# Patient Record
Sex: Female | Born: 1940 | Race: White | Hispanic: No | Smoking: Former smoker
Health system: Southern US, Community
[De-identification: ages and names within clinical notes are randomized; demographics above are authoritative.]

## PROBLEM LIST (undated history)

## (undated) DIAGNOSIS — M1990 Unspecified osteoarthritis, unspecified site: Secondary | ICD-10-CM

## (undated) DIAGNOSIS — H9190 Unspecified hearing loss, unspecified ear: Secondary | ICD-10-CM

## (undated) DIAGNOSIS — I2510 Atherosclerotic heart disease of native coronary artery without angina pectoris: Secondary | ICD-10-CM

## (undated) DIAGNOSIS — J40 Bronchitis, not specified as acute or chronic: Secondary | ICD-10-CM

## (undated) DIAGNOSIS — F329 Major depressive disorder, single episode, unspecified: Secondary | ICD-10-CM

## (undated) DIAGNOSIS — F32A Depression, unspecified: Secondary | ICD-10-CM

## (undated) DIAGNOSIS — G4730 Sleep apnea, unspecified: Secondary | ICD-10-CM

## (undated) DIAGNOSIS — I10 Essential (primary) hypertension: Secondary | ICD-10-CM

## (undated) HISTORY — PX: SHOULDER ARTHROSCOPY: SHX128

## (undated) HISTORY — PX: PACEMAKER INSERTION: SHX728

## (undated) HISTORY — PX: NOSE SURGERY: SHX723

## (undated) HISTORY — PX: JOINT REPLACEMENT: SHX530

## (undated) HISTORY — DX: Bronchitis, not specified as acute or chronic: J40

## (undated) HISTORY — PX: BACK SURGERY: SHX140

## (undated) HISTORY — DX: Depression, unspecified: F32.A

## (undated) HISTORY — PX: ABDOMINAL HYSTERECTOMY: SHX81

## (undated) HISTORY — PX: FOOT SURGERY: SHX648

## (undated) HISTORY — DX: Major depressive disorder, single episode, unspecified: F32.9

## (undated) HISTORY — DX: Essential (primary) hypertension: I10

## (undated) HISTORY — PX: APPENDECTOMY: SHX54

## (undated) HISTORY — PX: OTHER SURGICAL HISTORY: SHX169

## (undated) HISTORY — PX: SPINE SURGERY: SHX786

---

## 2005-02-20 ENCOUNTER — Ambulatory Visit: Payer: Self-pay | Admitting: General Practice

## 2006-01-23 ENCOUNTER — Ambulatory Visit: Payer: Self-pay | Admitting: Internal Medicine

## 2006-09-12 ENCOUNTER — Ambulatory Visit: Payer: Self-pay

## 2006-11-27 ENCOUNTER — Ambulatory Visit: Payer: Self-pay | Admitting: Orthopaedic Surgery

## 2006-12-04 ENCOUNTER — Ambulatory Visit: Payer: Self-pay | Admitting: Orthopaedic Surgery

## 2007-03-13 ENCOUNTER — Ambulatory Visit: Payer: Self-pay | Admitting: Vascular Surgery

## 2007-04-01 ENCOUNTER — Ambulatory Visit: Payer: Self-pay | Admitting: Vascular Surgery

## 2007-04-25 ENCOUNTER — Ambulatory Visit: Payer: Self-pay | Admitting: Internal Medicine

## 2007-05-15 ENCOUNTER — Ambulatory Visit: Payer: Self-pay | Admitting: Gastroenterology

## 2007-09-09 ENCOUNTER — Ambulatory Visit: Payer: Self-pay | Admitting: Internal Medicine

## 2008-09-09 ENCOUNTER — Ambulatory Visit: Payer: Self-pay | Admitting: Internal Medicine

## 2009-09-20 ENCOUNTER — Ambulatory Visit: Payer: Self-pay | Admitting: Internal Medicine

## 2010-02-13 ENCOUNTER — Ambulatory Visit: Payer: Self-pay | Admitting: General Practice

## 2010-03-01 ENCOUNTER — Inpatient Hospital Stay: Payer: Self-pay | Admitting: General Practice

## 2010-06-28 ENCOUNTER — Ambulatory Visit: Payer: Self-pay | Admitting: Internal Medicine

## 2010-08-23 ENCOUNTER — Ambulatory Visit: Payer: Self-pay | Admitting: Ophthalmology

## 2010-09-04 ENCOUNTER — Ambulatory Visit: Payer: Self-pay | Admitting: Ophthalmology

## 2010-09-05 ENCOUNTER — Ambulatory Visit: Payer: Self-pay | Admitting: Cardiovascular Disease

## 2011-01-17 ENCOUNTER — Ambulatory Visit: Payer: Self-pay | Admitting: Internal Medicine

## 2012-09-11 ENCOUNTER — Ambulatory Visit: Payer: Self-pay | Admitting: General Practice

## 2012-09-11 LAB — HEMOGLOBIN: HGB: 12.9 g/dL (ref 12.0–16.0)

## 2012-09-17 ENCOUNTER — Ambulatory Visit: Payer: Self-pay | Admitting: General Practice

## 2013-07-24 ENCOUNTER — Ambulatory Visit: Payer: Self-pay | Admitting: Family Medicine

## 2015-01-25 NOTE — Op Note (Signed)
PATIENT NAME:  Amber Bridges, Amber Bridges MR#:  [id number] DATE OF BIRTH:  1941-02-28  DATE OF PROCEDURE:  09/17/2012  PREOPERATIVE DIAGNOSIS: Right carpal tunnel syndrome.   POSTOPERATIVE DIAGNOSIS: Right carpal tunnel syndrome.    PROCEDURE PERFORMED: Right carpal tunnel release.   SURGEON: Laurice Record. Holley Bouche., MD   ANESTHESIA: General.   ESTIMATED BLOOD LOSS: Minimal.   TOURNIQUET TIME: 26 minutes.   DRAINS: None.   INDICATIONS FOR SURGERY: The patient is a 74 year old female who has been seen for complaints of numbness and paresthesias to the right hand with some radiation of paresthesias up the right arm. EMG nerve conduction studies were consistent with moderately severe right carpal tunnel syndrome. The patient denies any significant improvement despite splinting and activity modification. After discussion of the risks and benefits of surgical intervention, the patient expressed her understanding of the risks and benefits and agreed with plans for surgical intervention.   PROCEDURE IN DETAIL: The patient was brought into the operating room and, after adequate general anesthesia was achieved, a tourniquet was placed on the patient's upper right arm. The patient's right hand and arm were cleaned and prepped with alcohol and DuraPrep and draped in the usual sterile fashion. A "time-out" was performed as per usual protocol. The right upper extremity was exsanguinated using an Esmarch and the tourniquet was inflated to 250 mmHg. Loupe magnification was used throughout the procedure. A curvilinear incision was made just ulnar to the thenar palmar crease. Dissection was carried down through the palmar fascia to the transverse carpal ligament. Transverse carpal ligament was sharply incised, taking care to protect the underlying structures within the carpal tunnel. Complete release of the transverse carpal ligament was achieved. Inspection of the median nerve demonstrated a fusiform appearance initially  consistent with compression under the extent of the transverse carpal ligament. No evidence of lipoma or ganglion cyst. Wound was irrigated with copious amounts of normal saline with antibiotic solution. Skin edges were reapproximated with interrupted sutures of #5-0 nylon. 10 mL of 0.25% Marcaine was injected along the incision site. Sterile dressing was applied followed by application of a volar splint. Tourniquet was deflated after total tourniquet time of 26 minutes.   The patient tolerated the procedure well. She was transported to the recovery room in stable condition.   ____________________________ Laurice Record. Holley Bouche., MD jph:drc D: 09/17/2012 20:59:02 ET T: 09/18/2012 07:43:39 ET JOB#: [id number]  cc: Laurice Record. Holley Bouche., MD, <Dictator> Laurice Record Holley Bouche MD ELECTRONICALLY SIGNED 09/19/2012 19:47

## 2015-04-27 ENCOUNTER — Emergency Department: Payer: Medicare Other

## 2015-04-27 ENCOUNTER — Emergency Department
Admission: EM | Admit: 2015-04-27 | Discharge: 2015-04-27 | Disposition: A | Discharge status: Other Acute Inpatient Hospital | Payer: Medicare Other | Attending: Emergency Medicine | Admitting: Emergency Medicine

## 2015-04-27 DIAGNOSIS — X58XXXS Exposure to other specified factors, sequela: Secondary | ICD-10-CM | POA: Insufficient documentation

## 2015-04-27 DIAGNOSIS — S3992XS Unspecified injury of lower back, sequela: Secondary | ICD-10-CM | POA: Diagnosis not present

## 2015-04-27 DIAGNOSIS — M25551 Pain in right hip: Secondary | ICD-10-CM | POA: Diagnosis not present

## 2015-04-27 DIAGNOSIS — Z880 Allergy status to penicillin: Secondary | ICD-10-CM | POA: Diagnosis not present

## 2015-04-27 DIAGNOSIS — Z7982 Long term (current) use of aspirin: Secondary | ICD-10-CM | POA: Insufficient documentation

## 2015-04-27 DIAGNOSIS — G4730 Sleep apnea, unspecified: Secondary | ICD-10-CM | POA: Insufficient documentation

## 2015-04-27 DIAGNOSIS — R339 Retention of urine, unspecified: Secondary | ICD-10-CM | POA: Insufficient documentation

## 2015-04-27 DIAGNOSIS — Z79899 Other long term (current) drug therapy: Secondary | ICD-10-CM | POA: Insufficient documentation

## 2015-04-27 DIAGNOSIS — G8220 Paraplegia, unspecified: Secondary | ICD-10-CM | POA: Insufficient documentation

## 2015-04-27 DIAGNOSIS — M5116 Intervertebral disc disorders with radiculopathy, lumbar region: Secondary | ICD-10-CM | POA: Insufficient documentation

## 2015-04-27 DIAGNOSIS — G834 Cauda equina syndrome: Secondary | ICD-10-CM | POA: Insufficient documentation

## 2015-04-27 DIAGNOSIS — E669 Obesity, unspecified: Secondary | ICD-10-CM | POA: Insufficient documentation

## 2015-04-27 DIAGNOSIS — M549 Dorsalgia, unspecified: Secondary | ICD-10-CM | POA: Diagnosis present

## 2015-04-27 DIAGNOSIS — Z8659 Personal history of other mental and behavioral disorders: Secondary | ICD-10-CM | POA: Insufficient documentation

## 2015-04-27 LAB — URINALYSIS COMPLETE WITH MICROSCOPIC (ARMC ONLY)
BILIRUBIN URINE: NEGATIVE
Bacteria, UA: NONE SEEN
GLUCOSE, UA: NEGATIVE mg/dL
Hgb urine dipstick: NEGATIVE
KETONES UR: NEGATIVE mg/dL
NITRITE: NEGATIVE
Protein, ur: NEGATIVE mg/dL
Specific Gravity, Urine: 1.018 (ref 1.005–1.030)
pH: 7 (ref 5.0–8.0)

## 2015-04-27 LAB — BASIC METABOLIC PANEL
Anion gap: 10 (ref 5–15)
BUN: 27 mg/dL — ABNORMAL HIGH (ref 6–20)
CALCIUM: 9.5 mg/dL (ref 8.9–10.3)
CO2: 26 mmol/L (ref 22–32)
Chloride: 103 mmol/L (ref 101–111)
Creatinine, Ser: 1.17 mg/dL — ABNORMAL HIGH (ref 0.44–1.00)
GFR calc non Af Amer: 45 mL/min — ABNORMAL LOW (ref >60)
GFR, EST AFRICAN AMERICAN: 52 mL/min — AB (ref >60)
Glucose, Bld: 110 mg/dL — ABNORMAL HIGH (ref 65–99)
Potassium: 3.8 mmol/L (ref 3.5–5.1)
Sodium: 139 mmol/L (ref 135–145)

## 2015-04-27 LAB — CBC WITH DIFFERENTIAL/PLATELET
BASOS PCT: 1 %
Basophils Absolute: 0 10*3/uL (ref 0–0.1)
EOS ABS: 0.2 10*3/uL (ref 0–0.7)
Eosinophils Relative: 4 %
HCT: 39.9 % (ref 35.0–47.0)
HEMOGLOBIN: 13.2 g/dL (ref 12.0–16.0)
LYMPHS ABS: 1.4 10*3/uL (ref 1.0–3.6)
LYMPHS PCT: 23 %
MCH: 30.2 pg (ref 26.0–34.0)
MCHC: 33.2 g/dL (ref 32.0–36.0)
MCV: 90.9 fL (ref 80.0–100.0)
MONOS PCT: 5 %
Monocytes Absolute: 0.3 10*3/uL (ref 0.2–0.9)
NEUTROS ABS: 4.3 10*3/uL (ref 1.4–6.5)
Neutrophils Relative %: 67 %
Platelets: 168 10*3/uL (ref 150–440)
RBC: 4.39 MIL/uL (ref 3.80–5.20)
RDW: 13.6 % (ref 11.5–14.5)
WBC: 6.3 10*3/uL (ref 3.6–11.0)

## 2015-04-27 MED ORDER — HYDROMORPHONE HCL 1 MG/ML IJ SOLN
INTRAMUSCULAR | Status: AC
  Administered 2015-04-27: 1 mg via INTRAVENOUS
  Filled 2015-04-27: qty 1

## 2015-04-27 MED ORDER — HYDROMORPHONE HCL 1 MG/ML IJ SOLN
1.0000 mg | Freq: Once | INTRAMUSCULAR | Status: AC
  Administered 2015-04-27: 1 mg via INTRAVENOUS
  Filled 2015-04-27: qty 1

## 2015-04-27 MED ORDER — SODIUM CHLORIDE 0.9 % IV BOLUS (SEPSIS)
1000.0000 mL | Freq: Once | INTRAVENOUS | Status: AC
  Administered 2015-04-27: 1000 mL via INTRAVENOUS

## 2015-04-27 MED ORDER — HYDROMORPHONE HCL 1 MG/ML IJ SOLN
1.0000 mg | Freq: Once | INTRAMUSCULAR | Status: AC
  Administered 2015-04-27: 1 mg via INTRAVENOUS

## 2015-04-27 MED ORDER — METHYLPREDNISOLONE SODIUM SUCC 125 MG IJ SOLR
125.0000 mg | Freq: Once | INTRAMUSCULAR | Status: AC
  Administered 2015-04-27: 125 mg via INTRAVENOUS
  Filled 2015-04-27: qty 2

## 2015-04-27 NOTE — ED Notes (Signed)
Blood drawn at 0800, lab states they can not find blood and it was never received, will redraw blood when pt returns from MR

## 2015-04-27 NOTE — ED Notes (Signed)
Pt given ginger ale, pt resting in bed quietly awaiting [facility] pickup

## 2015-04-27 NOTE — ED Provider Notes (Addendum)
[facility] Emergency Department Provider Note  ____________________________________________  Time seen: 7:05 AM  I have reviewed the triage vital signs and the nursing notes.   HISTORY  Chief Complaint Back Pain and Leg Pain    HPI Amber Bridges is a 74 y.o. female who complains of low back pain and weakness in the left leg.  She was in her usual state of health up until about 2 weeks ago when she had a trip and fall at home onto her right hip. She came to [facility] clinic walk-in at that time and was given a prednisone taper without any further workup. She had persistent pain at that time that did not improve. She then traveled to [place] by plane, and noted that she began to have worsening left leg pain. She went to an emergency room there where she received an x-ray of the right leg, which was noted to be unremarkable, and the patient was discharged. She notes that at that time she was having difficulty walking. Since returning home, she's been having weakness in the left leg. She walks by pushing a chair around the house and leaning on the back of it. She notes that she has to swing her left leg and that it "doesn't want to do what I wanted to". 3 days ago, as she was pushing the chair into the bathroom, a got stuck on the floor causing her to fall into the bathtub. The low back pain has been significantly worsened since then, accompanied by worsened weakness and diminished sensation in the left leg.  She does have a history of 2 prior lumbar surgeries approximately 20 years ago and 30 years ago.   She denies any fevers or chills nausea vomiting diarrhea abdominal pain chest pain shortness of breath headache or syncope.       No past medical history on file.  hypertension, grief reaction  There are no active problems to display for this patient.   No past surgical history on file.  lumbar spine surgery in 1973 and 1993, left knee replacement, left  rotator cuff surgery  Current Outpatient Rx  Name  Route  Sig  Dispense  Refill  . amLODipine (NORVASC) 5 MG tablet   Oral   Take 1 tablet by mouth daily.         Marland Kitchen aspirin EC 81 MG tablet   Oral   Take 1 tablet by mouth daily.         Marland Kitchen atorvastatin (LIPITOR) 40 MG tablet   Oral   Take 1 tablet by mouth at bedtime.         Marland Kitchen FLUoxetine (PROZAC) 40 MG capsule   Oral   Take 1 capsule by mouth daily.         . hydrochlorothiazide (HYDRODIURIL) 25 MG tablet   Oral   Take 1 tablet by mouth daily.         Marland Kitchen HYDROcodone-acetaminophen (NORCO) 7.5-325 MG per tablet   Oral   Take 1 tablet by mouth every 4 (four) hours as needed for moderate pain.          Marland Kitchen losartan (COZAAR) 50 MG tablet   Oral   Take 1 tablet by mouth daily.         . Multiple Vitamins-Minerals (STRESS TAB NF PO)   Oral   Take 1 tablet by mouth daily.         Marland Kitchen oxaprozin (DAYPRO) 600 MG tablet   Oral   Take  1 tablet by mouth daily.         . traZODone (DESYREL) 50 MG tablet   Oral   Take 1 tablet by mouth at bedtime as needed for sleep.            Allergies Penicillins; Egg white; and Fluzone  No family history on file.  Social History History  Substance Use Topics  . Smoking status: Not on file  . Smokeless tobacco: Not on file  . Alcohol Use: Not on file  No tobacco alcohol or drug use   Review of Systems  Constitutional: No fever or chills. No weight changes Eyes:No blurry vision or double vision.  ENT: No sore throat. Cardiovascular: No chest pain. Respiratory: No dyspnea or cough. Gastrointestinal: Negative for abdominal pain, vomiting and diarrhea.  No BRBPR or melena. Genitourinary: Negative for dysuria, urinary retention, bloody urine, or difficulty urinating. MusculoskeletalPositive back pain as above with right leg pain, and left leg pain and swelling and weakness and diminished sensation. She also has left upper arm pain. Skin: Negative for  rash. Neurological: Negative for headaches, focal weakness or numbness. Psychiatric:No anxiety or depression.   Endocrine:No hot/cold intolerance, changes in energy, or sleep difficulty.  10-point ROS otherwise negative.  ____________________________________________   PHYSICAL EXAM:  VITAL SIGNS: ED Triage Vitals  Enc Vitals Group     BP 04/27/15 0633 163/66 mmHg     Pulse Rate 04/27/15 0633 58     Resp 04/27/15 0633 20     Temp 04/27/15 0633 97.9 F (36.6 C)     Temp Source 04/27/15 0633 Oral     SpO2 04/27/15 0700 99 %     Weight 04/27/15 0633 237 lb (107.502 kg)     Height 04/27/15 0633 5' 5.5" (1.664 m)     Head Cir --      Peak Flow --      Pain Score 04/27/15 0634 8     Pain Loc --      Pain Edu? --      Excl. in [place]? --      Constitutional: Alert and oriented. Well appearing and in no distress. Eyes: No scleral icterus. No conjunctival pallor. PERRL. EOMI ENT   Head: Normocephalic and atraumatic.   Nose: No congestion/rhinnorhea. No septal hematoma   Mouth/Throat: MMM, no pharyngeal erythema. No peritonsillar mass. No uvula shift.   Neck: No stridor. No SubQ emphysema. No meningismus. Hematological/Lymphatic/Immunilogical: No cervical lymphadenopathy. Cardiovascular: RRR. Normal and symmetric distal pulses are present in all extremities. No murmurs, rubs, or gallops. Respiratory: Normal respiratory effort without tachypnea nor retractions. Breath sounds are clear and equal bilaterally. No wheezes/rales/rhonchi. Gastrointestinal: Soft and nontender. No distention. There is no CVA tenderness.  No rebound, rigidity, or guarding. Genitourinary: deferred MusculoskeletalThere is a large area of scar tissue running vertically across the lumbar spine. This area is mildly tender without deformity or step-off. No soft tissue tenderness. No warmth or drainage. The left upper humerus is also tender over the proximal humerus. There is normal range of motion of the  shoulder. She has intact range of motion of both hips. The right hip is tender to palpation laterally . the left leg is tender in the posterior thigh and swollen compared to the right, with calf circumference 2 cm greater than the right.  Neurologic:   Normal speech and language.  CN 2-10 normal Plantarflexion intact of bilateral feet. Very minimal strength with dorsiflexion of the left foot. 2 out of 6 strength with hip  flexion of the left leg. Weakness of left knee extension.   gait not tested due to above deficit and pain. There is diminished sensation diffusely below the knee on the left leg.   Skin:  Skin is warm, dry and intact. No rash noted.  No petechiae, purpura, or bullae. Psychiatric: Mood and affect are normal. Speech and behavior are normal. Patient exhibits appropriate insight and judgment.  ____________________________________________    LABS (pertinent positives/negatives) (all labs ordered are listed, but only abnormal results are displayed) Labs Reviewed  BASIC METABOLIC PANEL - Abnormal; Notable for the following:    Glucose, Bld 110 (*)    BUN 27 (*)    Creatinine, Ser 1.17 (*)    GFR calc non Af Amer 45 (*)    GFR calc Af Amer 52 (*)    All other components within normal limits  CBC WITH DIFFERENTIAL/PLATELET  URINALYSIS COMPLETEWITH MICROSCOPIC ([facility] ONLY)   ____________________________________________   EKG    ____________________________________________    RADIOLOGY  X-ray left humerus unremarkable Ultrasound left leg unremarkable, no evidence of DVT MRI lumbar spine and pelvis significant for 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 I discussed these results with the radiologist.   ____________________________________________   PROCEDURES CRITICAL CARE Performed by: Joni Fears, Nithila Sumners   Total critical care time: 35 minutes  Critical care time was exclusive of separately billable  procedures and treating other patients.  Critical care was necessary to treat or prevent imminent or life-threatening deterioration.  Critical care was time spent personally by me on the following activities: development of treatment plan with patient and/or surrogate as well as nursing, discussions with consultants, evaluation of patient's response to treatment, examination of patient, obtaining history from patient or surrogate, ordering and performing treatments and interventions, ordering and review of laboratory studies, ordering and review of radiographic studies, pulse oximetry and re-evaluation of patient's condition. ____________________________________________   INITIAL IMPRESSION / ASSESSMENT AND PLAN / ED COURSE  Pertinent labs & imaging results that were available during my care of the patient were reviewed by me and considered in my medical decision making (see chart for details).  Patient presents with multiple areas of pain and tenderness related to 2 recent falls. With her recent travel in the setting of trauma and some swelling of the left leg, we will ultrasound the left leg to evaluate for DVT, x-ray of the left humerus, and MRI of the L-spine to evaluate for cauda equina and MRI of the pelvis to evaluate for occult fracture.   ----------------------------------------- 10:55 AM on 04/27/2015 -----------------------------------------  Workup significant for extruded L3-L4 disc that is causing severe spinal stenosis. In the setting of the multiple deficits this is consistent with cauda equina syndrome. The patient was given Solu-Medrol IV, and arranged for transfer.  I attempted to arrange transfer to [facility] as the patient's other providers are in the [place] system, but [facility] is on complete diversion and unable to accept transfers at this time. I did arrange transfer to [facility], where the patient was accepted by Dr. Sharlett Iles to the ED. Transfer will be performed  by [facility] ground transport services as this is the fastest most readily available appropriate method of transport available at this time.  ____________________________________________   FINAL CLINICAL IMPRESSION(S) / ED DIAGNOSES  Final diagnoses:  Lower back injury, sequela  Hip pain, acute, right  CES (cauda equina syndrome)      Carrie Mew, MD 04/27/15 1059   -----------------------------------------  2:37 PM on 04/27/2015 -----------------------------------------  [facility] ground transport team has just arrived in the ED to transport patient to [facility] for further management of cauda equina syndrome. Vitals have remained stable in the ED. The patient was given one repeat dose of IV Dilaudid for pain. She remains medically stable with no other acute issues and suitable for transport to the receiving facility.  Carrie Mew, MD 04/27/15 1438

## 2015-04-27 NOTE — ED Notes (Signed)
Pt brought to ER from home. Pt reports injuring right hip on April 02, 2015 and went to [facility] and given percoset for pain.  Pt reports that on 7/17 pt reports that while she was in [place] she was walking and developed extreme pain. Pt went to urgent care and was told  That she needed to go to ER . While in the ER they did an xray and diagnosed pt with contusion of right hip, pt reports that when she got off the Xray table she developed pain in left leg and lower back. Pt reports that left leg is numb and she must either drag her leg or swing it out to walk. Pt reports take 1 Norco 7.5-3.25 mg tablet half an hour prior to EMS arriving. And also took 04/26/15 around 2200. VS per EMS 124/86 HR 64 RR16 98% on RA

## 2015-04-27 NOTE — ED Notes (Signed)
Patient transported to Ultrasound 

## 2015-06-08 ENCOUNTER — Encounter: Payer: Self-pay | Admitting: Physical Therapy

## 2015-06-08 ENCOUNTER — Ambulatory Visit: Payer: Medicare Other | Attending: Physical Medicine and Rehabilitation | Admitting: Physical Therapy

## 2015-06-08 ENCOUNTER — Ambulatory Visit: Payer: Medicare Other | Admitting: Occupational Therapy

## 2015-06-08 DIAGNOSIS — R269 Unspecified abnormalities of gait and mobility: Secondary | ICD-10-CM | POA: Diagnosis present

## 2015-06-08 DIAGNOSIS — R29898 Other symptoms and signs involving the musculoskeletal system: Secondary | ICD-10-CM

## 2015-06-08 DIAGNOSIS — M545 Low back pain, unspecified: Secondary | ICD-10-CM

## 2015-06-08 NOTE — Therapy (Signed)
[place] [facility] MAIN [facility] SERVICES [address] [place], [place], [address] Phone: [phone]   Fax:  [phone]  Physical Therapy Evaluation  Patient Details  Name: Amber Bridges MRN: [id number] Date of Birth: [birth year] Referring Provider:  Lubertha South, MD  Encounter Date: 06/08/2015      PT End of Session - 06/08/15 1512    Visit Number 1   Number of Visits 25   Date for PT Re-Evaluation 08/31/15   Authorization Type 1   Authorization Time Period Gcode 10    PT Start Time 1350   PT Stop Time 1447   PT Time Calculation (min) 57 min   Equipment Utilized During Treatment Gait belt   Activity Tolerance Patient tolerated treatment well   Behavior During Therapy [facility] for tasks assessed/performed      Past Medical History  Diagnosis Date  . Depression   . Hypertension   . Bronchitis     Past Surgical History  Procedure Laterality Date  . Spine surgery  1973, 1978, 2016    Most recent Laminectomy to T11 and L3    There were no vitals filed for this visit.  Visit Diagnosis:  Leg weakness, bilateral - Plan: PT plan of care cert/re-cert  Abnormality of gait - Plan: PT plan of care cert/re-cert  Bilateral low back pain without sciatica - Plan: PT plan of care cert/re-cert      Subjective Assessment - 06/08/15 1357    Subjective Patient is a pleasant 74 year old female who comes to PT for decreased strength, foot drop, and occasional back pain secondary to cauda equina syndrome and Spinal laminectomies. She states that while on vacation in [place] she was walking up a hill she experienced severe back pain. She went to the ED and through x-rays was told to have that she had arthritis and sent home with pain medication. She developed decreased use of her LE after the x-rays.  She returned home to [place] and went to [facility] when the pain and function in her legs did not return. 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 Patient was diagnosed with Cauda Equina syndrome, and had spinal laminectomies at multiple levels on 04/28/15. She states that her movement and sensation has been returning slowly since her Spinal surgery.   Patient is accompained by: Family member   Pertinent History Previous back surgery in 1973 at L5 for disc decompression and then scar tissue removal in 1978. She states that she has had back problems that would present every couple of years and usually get better with rest. Patient also has a history of multiple falls.   Limitations Standing;Walking   How long can you sit comfortably? Able to sit as long as needed    How long can you stand comfortably? able to stand for for up to 6 minutes without holding on    How long can you walk comfortably? States that she can walk up to 100 ft at home with RW.    Diagnostic tests MRI 04/27/15 revealed severe spinal stenosis at L3-L4 and partial stenosis at T11-L3 and L5-S1. Patient underwent spinal Laminectomies of T11-T12 and L3-L4 on 04/28/15.    Patient Stated Goals Be able to walk with least restrictive device. Wants to be able to drive and go to store and shop for herself   Currently in Pain? No/denies  [facility] PT Assessment - 06/08/15 0001    Assessment   Medical Diagnosis Cauda equina syndrome   Onset Date/Surgical Date 04/28/15   Hand Dominance Left   Next MD Visit 06/14/2015 with neurologist at [facility].     Prior Therapy PT at [facility] inpatient rehab 7/27 - 05/27/15. Patient reports making significant progress while at [facility].    Precautions   Precautions Fall;Back   Precaution Booklet Issued No   Precaution Comments No bending, lifting or twisting the low back; patient unsure how long these precautions are in effect.    Required Braces or Orthoses --  bilateral AFO    Restrictions   Weight Bearing Restrictions No   Other Position/Activity Restrictions --   Balance Screen   Has the patient  fallen in the past 6 months Yes   How many times? 2   Has the patient had a decrease in activity level because of a fear of falling?  Yes   Is the patient reluctant to leave their home because of a fear of falling?  No   Home Environment   Living Environment Private residence   Living Arrangements Children   Available Help at Discharge Family   Type of Home House   Home Access Level entry  with threshold    Home Layout One level   Home Equipment Walker - 2 wheels;Walker - 4 wheels;Tub bench;Bedside commode;Wheelchair - manual   Additional Comments Patient is now living with daughter.    Prior Function   Level of Independence Independent   Vocation Retired   Leisure knit, crochet, stained glass, going to [place]   Overall Cognitive Status Within Functional Limits for tasks assessed   Observation/Other Assessments   Lower Extremity Functional Scale  19/80. (higher score indicates greater function)    Sensation   Light Touch Impaired by gross assessment   Additional Comments Patient demonstrated decreased light touch appreciation to L5-S1 dermatome and exhibited extinction in the L LE from L4-S1    Posture/Postural Control   Posture Comments Patient sits and stands with forward head, rounded shoulders and decreased lumbar lordosis.    AROM   Overall AROM Comments Patient demonstrated AROM Within functional limits for bilateral LE except L LE Dorsiflexion; unable to DF beyond neutral.     Strength   Right Hip Flexion 4-/5   Right Hip Extension 4/5   Right Hip ABduction 4/5   Right Hip ADduction 4-/5   Left Hip Flexion 4-/5   Left Hip Extension 4/5   Left Hip ABduction 4-/5   Left Hip ADduction 4-/5   Right Knee Flexion 4/5   Right Knee Extension 4+/5   Left Knee Flexion 4-/5   Left Knee Extension 4+/5   Right Ankle Dorsiflexion 4/5   Right Ankle Plantar Flexion 4-/5   Left Ankle Dorsiflexion 4-/5   Left Ankle Plantar Flexion 4-/5   Palpation   Palpation comment  Slight tenderness noted in the low back at surgical sites. No other tenderness noted.    Bed Mobility   Rolling Right 6: Modified independent (Device/Increase time)   Supine to Sit 6: Modified independent (Device/Increase time)   Sit to Supine 6: Modified independent (Device/Increase time)   Sit to Sidelying Right 6: Modified independent (Device/Increase time)   Transfers   Comments Patient required min Verbal instruction for increased safety with transfers with RW and CGA.    Ambulation/Gait   Gait Comments Patient demonstrated decreased cadence, flat foot contact bilaterally, but L>R. Also  demonstrated decreased step length bilaterally, decreased foot clearance, and increased thoracic kyphosis with gait using RW and CGA from PT. AFO's are required at this time to prevent foot drop.    Standardized Balance Assessment   Five times sit to stand comments  38 seconds with bilateral UE use( >15 seconds without UE support indicates increased fall risk     10 Meter Walk 0.19 m/s with RW. ( <1.0 m/s indicates decreased community ambulation and increased fall risk. )        Treatment:  Patient instructed in LE strengthening HEP   Seated therex with yellow tband  BLE Hip flexion x5  BLE knee flexion x5  BLE knee extension x5   Sit to stand x5 with bilateral UE use.   PT Provided moderate verbal, tactile and visual instruction for proper exercise set up as well as increased ROM and eccentric control to improve strengthening. Patient responded well to instructions.                   PT Education - 06/08/15 1511    Education provided Yes   Education Details Plan of care, HEP initiated - see patient instructions    Person(s) Educated Patient;Child(ren)   Methods Explanation;Demonstration;Tactile cues;Verbal cues;Handout   Comprehension Verbalized understanding;Returned demonstration;Verbal cues required;Tactile cues required             PT Long Term Goals - 06/08/15 1530     PT LONG TERM GOAL #1   Title Patient will be independent with HEP to improve strength, balance and gait to increase function with tasks at home by 08/31/15.   Time 12   Period Weeks   Status New   PT LONG TERM GOAL #2   Title Patient will increase bilateral LE strength to at least 4+/5 throughout the LE to allow patient to access stairs in the community with greater ease by 08/31/15.   Time 12   Period Weeks   Status New   PT LONG TERM GOAL #3   Title Patient will score >46 on the Berg balance scale to reduce fall risk by 08/31/15.   Time 12   Period Weeks   Status New   PT LONG TERM GOAL #4   Title Patient will improve gait speed to >1.0 m/s with Least restrictive assistive device to reduce fall risk and increase ease of access to the community by 08/31/15.   Time 12   Period Weeks   Status New   PT LONG TERM GOAL #5   Title Patient will score >40/80 on the LEFS to indicate improved function with daily tasks and decreased impairment by 08/31/15.   Time 12   Period Weeks   Status New               Plan - 06/08/15 1513    Clinical Impression Statement Patient is a 74 year old female that presents to PT with decreased sensation, foot drop and decreased strength secondary to Cauda equina syndrome. Upon evaluation patient was found to have decreased strength in Bilateral LE, with the L LE more impaired than the R. At this time patient requires bilateral AFO to prevent foot drop. She ambulates with RW. Patient demonstrated altered mechanics and decreased speed with decreased step length bilaterally, flat foot contact with step L>R, and increased thoracic kyphosis. Patient able to demonstrate Mod I bed mobility with use of R UE  to aid with sit to supine and supine to sit. LE Sensation testing revealed decreased  sensation on the L5 and S1 dermatomes of the L LE and extinction noted in the L LE in L4-S1. Balance was also found to be decreased with increased UE support and increased  time with the 5 times sit to stand. Based on deficits noted upon PT evaluation, this patient would benefit from skilled PT to improve LE strength, gait, and balance to allow patient to be more independent with ADLs.   Pt will benefit from skilled therapeutic intervention in order to improve on the following deficits Abnormal gait;Decreased activity tolerance;Decreased balance;Decreased coordination;Decreased endurance;Decreased knowledge of precautions;Decreased knowledge of use of DME;Decreased mobility;Decreased range of motion;Decreased strength;Difficulty walking;Increased muscle spasms;Impaired perceived functional ability;Impaired flexibility;Impaired sensation;Improper body mechanics;Postural dysfunction;Obesity   Rehab Potential Good   Clinical Impairments Affecting Rehab Potential Positive: good family support, high PLOF. Negative: recent surgery, decreased sensation    PT Frequency 2x / week   PT Duration 12 weeks   PT Treatment/Interventions ADLs/Self Care Home Management;Cryotherapy;Electrical Stimulation;Moist Heat;DME Instruction;Gait training;Stair training;Functional mobility training;Therapeutic activities;Therapeutic exercise;Balance training;Neuromuscular re-education;Patient/family education;Orthotic Fit/Training;Wheelchair mobility training;Manual techniques;Passive range of motion;Energy conservation   PT Next Visit Plan Berg balance scale, Balance exercises, LE strengthening.    PT Home Exercise Plan HEP initiated - see patient instructions    Recommended Other Services Patient is also receiving OT   Consulted and Agree with Plan of Care Patient;Family member/caregiver          G-Codes - 06-09-15 1752    Functional Assessment Tool Used LEFs, 10 meter walk, MMT, 5 times sit<>Stand   Functional Limitation Mobility: Walking and moving around   Mobility: Walking and Moving Around Current Status [phone]) At least 60 percent but less than 80 percent impaired, limited or restricted    Mobility: Walking and Moving Around Goal Status [phone]) At least 20 percent but less than 40 percent impaired, limited or restricted       Problem List There are no active problems to display for this patient.  Grier Rocher SPT 06-09-15   5:54 PM  This entire session was performed under direct supervision and direction of a licensed therapist . I have personally read, edited and approve of the note as written.   Hopkins,Margaret, PT, DPT 06/09/2015, 5:54 PM  [place] [facility] MAIN [facility] SERVICES [address] [place], [place], [address] Phone: [phone]   Fax:  [phone]

## 2015-06-08 NOTE — Patient Instructions (Signed)
FLEXION: Sitting - Resistance Band (Active)   Sit, both feet flat. Against yellow resistance band, lift right knee toward ceiling. Complete _2__ sets of _10__ repetitions. Perform _2__ sessions per day.  http://gtsc.exer.us/21   Copyright  VHI. All rights reserved.  FLEXION: Sitting - Resistance Band (Active)   Sit with right leg extended. Against yellow resistance band, bend knee and draw foot backward. Complete _2__ sets of _10__ repetitions. Perform __2_ sessions per day.  http://gtsc.exer.us/231   Copyright  VHI. All rights reserved.  EXTENSION: Sitting - Resistance Band (Active)   Sit with feet flat. Against yellow resistance band, straighten right knee. Complete _2__ sets of __10_ repetitions. Perform __2_ sessions per day.  Copyright  VHI. All rights reserved.  Functional Quadriceps: Sit to Stand   Sit on edge of chair, feet flat on floor. Stand upright, extending knees fully. Repeat __5-7__ times per set. Do __2__ sets per session. Do ___2_ sessions per day.  http://orth.exer.us/735   Copyright  VHI. All rights reserved.

## 2015-06-08 NOTE — Therapy (Signed)
[place] [facility] MAIN [facility] SERVICES [address] [place], [place], [address] Phone: [phone]   Fax:  [phone]  Occupational Therapy Evaluation  Patient Details  Name: Amber Bridges MRN: [id number] Date of Birth: [birth year] Referring Provider:  Lubertha South, MD  Encounter Date: 06/08/2015      OT End of Session - 06/08/15 1552    Visit Number 1   Number of Visits 24   Date for OT Re-Evaluation 08/31/15      Past Medical History  Diagnosis Date  . Depression   . Hypertension   . Bronchitis     Past Surgical History  Procedure Laterality Date  . Spine surgery  1973, 1978, 2016    Most recent Laminectomy to T11 and L3    There were no vitals filed for this visit.  Visit Diagnosis:  Leg weakness, bilateral  Bilateral low back pain without sciatica Practiced kitchen activities standing and transferring objects from one surface to another.  Could tolerate about 3 minutes each time.                                OT Long Term Goals - 06/08/15 1615    OT LONG TERM GOAL #1   Title Patient will improve standing balance to be able to cook.   Baseline Unable to cook   Time 12   Period Weeks   Status New   OT LONG TERM GOAL #2   Title Will be able to safely pull up pants with both hands.   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   Baseline Unable to stand and use grinder.   Time 12   Period Weeks   Status New   OT LONG TERM GOAL #4   Title Will be able to retun to yard work.   Baseline Unable to do yard work.   Time 12   Period Weeks   Status New   OT LONG TERM GOAL #5   Title Will be able to feed her dog.   Baseline unable to feed her dog.   Time 12   Period Weeks   Status New   OT LONG TERM GOAL #6   Title will be  able to get in and out of the shower   Baseline unable to get in and out of the shower    Time 12   Period Weeks   Status New   OT LONG TERM GOAL #7   Title Will be able to get off a regular toilet   Baseline Unable to get off regular toilet (example in a public bathroom)   Time 12   Period Weeks   Status New   OT LONG TERM GOAL #8   Title Patient will be able to tolerate upright stance to vacuum.   Baseline Unable to vacuum   Time 12   Period Weeks   Status New               Plan - 06/08/15 1603    Clinical Impression Statement This patient is a 74 year old female who came to [facility] after surgery for Cauda Equina syndrom. This caused B LE weakness and  foot drop. This also interfers with her ADL as she was very active.  Her upper body is strong. Shoulders not tested but elbow, wrist and forearm are 5/5.Grip strength is 55 on right and 42 on left.  Her daughter and son in law are living with her while they are building a house.  She uses a hip kit to dress. She is unable to cook, pull up pants with both hands, doing her hobby of making stain glass, unable to do yard work, feed her dog, or use a regular toilet. She would benefit from occupational therapy for ADL and functional mobility training.   Pt will benefit from skilled therapeutic intervention in order to improve on the following deficits (Retired) Abnormal gait;Decreased activity tolerance;Decreased balance;Decreased endurance;Decreased mobility;Decreased strength;Difficulty walking;Impaired perceived functional ability;Impaired flexibility;Improper body mechanics   Rehab Potential Good   OT Frequency 2x / week   OT Duration 12 weeks   OT Treatment/Interventions Self-care/ADL training;Therapeutic activities   Plan OT 2 x per week for ADL training.   Consulted and Agree with Plan of Care Patient;Family member/caregiver          G-Codes - 06-15-15 1516    Functional Assessment Tool Used clinical judgment.   Functional  Limitation Self care   Self Care Current Status [phone]) At least 40 percent but less than 60 percent impaired, limited or restricted   Self Care Goal Status (M0102) At least 1 percent but less than 20 percent impaired, limited or restricted      Problem List There are no active problems to display for this patient.   Amber Bridges Amber Cornfield, MS/OTR/L  06-15-15, 4:16 PM  [place] [facility] MAIN [facility] SERVICES [address] [place], [place], [address] Phone: [phone]   Fax:  [phone]

## 2015-06-14 ENCOUNTER — Encounter: Payer: Self-pay | Admitting: Occupational Therapy

## 2015-06-14 ENCOUNTER — Ambulatory Visit: Payer: Medicare Other

## 2015-06-14 ENCOUNTER — Encounter: Payer: Self-pay | Admitting: Physical Therapy

## 2015-06-14 ENCOUNTER — Ambulatory Visit: Payer: Medicare Other | Attending: Physical Medicine and Rehabilitation | Admitting: Occupational Therapy

## 2015-06-14 DIAGNOSIS — R269 Unspecified abnormalities of gait and mobility: Secondary | ICD-10-CM

## 2015-06-14 DIAGNOSIS — M545 Low back pain, unspecified: Secondary | ICD-10-CM

## 2015-06-14 DIAGNOSIS — R29898 Other symptoms and signs involving the musculoskeletal system: Secondary | ICD-10-CM | POA: Diagnosis not present

## 2015-06-14 NOTE — Patient Instructions (Signed)
Strengthening Plantar Flexion (Resistive)   Place a piece of resistive band around right foot near toes. Push toes down against band. Repeat ___12_ times. Do _2___ sessions per day.  http://gt2.exer.us/430   Copyright  VHI. All rights reserved.  ANKLE: Dorsiflexion (Band)   Sit at edge of surface. Place band around top of foot. Keeping heel on floor, raise toes of banded foot. Hold __2_ seconds. Use ___red_____ band. __12_ reps per set, _2__ sets per day, __5_ days per week  Copyright  VHI. All rights reserved.  Ankle Bend: Dorsiflexion / Plantar Flexion, Sitting   Sit with feet on floor. Point toes up, keeping both heels on floor. Then press toes to floor raising heels. Hold each position _2__ seconds. Repeat _12__ times per session. Do __2_ sessions per day.  Copyright  VHI. All rights reserved.  ANKLE: Eversion, Unilateral (Band)   Place band around left foot. Keeping heel in place, raise toes of banded foot up and away from body. Do not move hip. Hold __2_ seconds. Use ____red____ band. _10__ reps per set, _2__ sets per day, __5_ days per week  Copyright  VHI. All rights reserved.

## 2015-06-14 NOTE — Therapy (Signed)
[place] [facility] MAIN [facility] SERVICES [address] [place], [place], [address] Phone: [phone]   Fax:  [phone]  Occupational Therapy Treatment  Patient Details  Name: Amber Bridges MRN: [id number] Date of Birth: 01-25-1941 Referring Provider:  Lubertha South, MD  Encounter Date: 06/14/2015      OT End of Session - 06/14/15 1357    Visit Number 2   Number of Visits 24   Date for OT Re-Evaluation 08/31/15   OT Start Time 1301   OT Stop Time 1345   OT Time Calculation (min) 44 min   Activity Tolerance Patient tolerated treatment well   Behavior During Therapy [facility] for tasks assessed/performed      Past Medical History  Diagnosis Date  . Depression   . Hypertension   . Bronchitis     Past Surgical History  Procedure Laterality Date  . Spine surgery  1973, 1978, 2016    Most recent Laminectomy to T11 and L3    There were no vitals filed for this visit.  Visit Diagnosis:  Leg weakness, bilateral  Bilateral low back pain without sciatica      Subjective Assessment - 06/14/15 1354    Subjective  I made myself a salad.    Completed standing activities during ADL tasks including reaching side to side and up over her head. Readjustment to keep back pain and spasms at bay. Patient could tolerate 2 minutes and 50 seconds per time. During sitting discussed techniques to modify activities to make them safer and more convenient.                                 OT Long Term Goals - 06/08/15 1615    OT LONG TERM GOAL #1   Title Patient will improve standing balance to be able to cook.   Baseline Unable to cook   Time 12   Period Weeks   Status New   OT LONG TERM GOAL #2   Title Will be able to safely pull up pants with both hands.   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   Baseline Unable to stand and use grinder.   Time 12   Period Weeks   Status New   OT LONG TERM GOAL #4   Title Will be able to retun to yard work.   Baseline Unable to do yard work.   Time 12   Period Weeks   Status New   OT LONG TERM GOAL #5   Title Will be able to feed her dog.   Baseline unable to feed her dog.   Time 12   Period Weeks   Status New   OT LONG TERM GOAL #6   Title will be able to get in and out of the shower   Baseline unable to get in and out of the shower    Time 12   Period Weeks   Status New   OT LONG TERM GOAL #7   Title Will be able to get off a regular toilet   Baseline Unable to get off regular toilet (example in a public bathroom)   Time 12   Period Weeks  Status New   OT LONG TERM GOAL #8   Title Patient will be able to tolerate upright stance to vacuum.   Baseline Unable to vacuum   Time 12   Period Weeks   Status New               Plan - 06/14/15 1358    Clinical Impression Statement Patient slowly adding activites at home staying with in safety limites.   Pt will benefit from skilled therapeutic intervention in order to improve on the following deficits (Retired) Abnormal gait;Decreased activity tolerance;Decreased balance;Decreased endurance;Decreased mobility;Decreased strength;Difficulty walking;Impaired perceived functional ability;Impaired flexibility;Improper body mechanics   OT Treatment/Interventions Self-care/ADL training;Therapeutic activities        Problem List There are no active problems to display for this patient.   [name] [name], MS/OTR/L  06/14/2015, 2:02 PM  [place] [facility] MAIN [facility] SERVICES [address] [place], [place], [address] Phone: [phone]   Fax:  [phone]

## 2015-06-14 NOTE — Patient Instructions (Addendum)
Instructed to use an apron to carry ADL objects and her cell phone. Instructed in energy saving techniques during ADL.

## 2015-06-14 NOTE — Therapy (Addendum)
[place] [facility] MAIN [facility] SERVICES [address] [place], [place], [address] Phone: [phone]   Fax:  [phone]  Physical Therapy Treatment  Patient Details  Name: Amber Bridges MRN: [id number] Date of Birth: 02/20/1941 Referring Provider:  Lubertha South, MD  Encounter Date: 06/14/2015      PT End of Session - 06/14/15 1607    Visit Number 2   Number of Visits 25   Date for PT Re-Evaluation 08/31/15   Authorization Type 2   Authorization Time Period Gcode 10    PT Start Time 1348   PT Stop Time 1438   PT Time Calculation (min) 50 min   Equipment Utilized During Treatment Gait belt   Activity Tolerance Patient tolerated treatment well   Behavior During Therapy [facility] for tasks assessed/performed      Past Medical History  Diagnosis Date  . Depression   . Hypertension   . Bronchitis     Past Surgical History  Procedure Laterality Date  . Spine surgery  1973, 1978, 2016    Most recent Laminectomy to T11 and L3    There were no vitals filed for this visit.  Visit Diagnosis:  Leg weakness, bilateral  Bilateral low back pain without sciatica  Abnormality of gait      Subjective Assessment - 06/14/15 1354    Subjective Patient reports that she had an early morning today for a visit with neurologist in [place]; she states that she was released by that neurolgist. States that she has slight pain in the low back 3/10 seconary to reaching activity with OT .    Patient is accompained by: Family member   Pertinent History Previous back surgery in 1973 at L5 for disc decompression and then scar tissue removal in 1978. She states that she has had back problems that would present every couple of years and usually get better with rest. Patient also has a history of multiple falls.   Limitations Standing;Walking   How long can you sit comfortably? Able to sit as long as needed    How long can you stand comfortably? able to  stand for for up to 6 minutes without holding on    How long can you walk comfortably? States that she can walk up to 100 ft at home with RW.    Diagnostic tests MRI 04/27/15 revealed severe spinal stenosis at L3-L4 and partial stenosis at T11-L3 and L5-S1. Patient underwent spinal Laminectomies of T11-T12 and L3-L4 on 04/28/15.    Patient Stated Goals Be able to walk with least restrictive device. Wants to be able to drive and go to store and shop for herself   Currently in Pain? Yes   Pain Score 3    Pain Location Back   Pain Orientation Lower;Left;Right   Pain Descriptors / Indicators Sharp   Pain Type Acute pain   Pain Onset Today   Pain Frequency Intermittent            [facility] PT Assessment - 06/15/15 0001    Berg Balance Test   Sit to Stand Able to stand using hands after several tries   Standing Unsupported Able to stand safely 2 minutes   Sitting with Back Unsupported but Feet Supported on Floor or Stool Able to sit safely and securely 2 minutes   Stand to Sit Controls descent by using hands   Transfers Able to transfer safely, definite need of hands   Standing Unsupported with Eyes Closed Able  to stand 3 seconds   Standing Ubsupported with Feet Together Able to place feet together independently and stand 1 minute safely   From Standing, Reach Forward with Outstretched Arm Can reach forward >5 cm safely (2")   From Standing Position, Pick up Object from Floor Unable to try/needs assist to keep balance   From Standing Position, Turn to Look Behind Over each Shoulder Needs supervision when turning   Turn 360 Degrees Needs assistance while turning   Standing Unsupported, Alternately Place Feet on Step/Stool Able to complete >2 steps/needs minimal assist   Standing Unsupported, One Foot in Front Needs help to step but can hold 15 seconds   Standing on One Leg Unable to try or needs assist to prevent fall   Total Score 27        Treatment:   HEP re-education   Seated  therex with Red tband  Hip flexion 2x10 BLE Hip abduction 2x10 BLE Knee flexion 2x10 BLE Knee extension 2x10 BLE   Advanced HEP R ankle with Red tband, L ankle without resistance (except PF) R and L ankle PF 2x10  R and L ankle eversion 2x10  R and L ankle dorsiflexion 2x10   For all seated therex, PT provided Min-Mod verbal and visual instruction for proper exercise technique including increased ROM, improved eccentric control, and proper exercise set up to increase strengthening and improve LE movement patterns. Patient demonstrated very good response to exercise instruction.     Patient instructed in the Berg balance scale: see above for results  Patient required multiple seated rest breaks during the Berg balance test due to bilateral LE fatigue. Patient demonstrated increased trunk lean and anchoring BLE on the chair for each sit<>stand transfer completed at rest break.   Gait within therapy gym 2x30 ft with RW and supervision assist from PT. PT provided verbal instruction for increased safety with stand to sit with cues to decrease LE anchoring on movable chair. Patient responded well to instruction.                    PT Education - 06/14/15 1411    Education provided Yes   Education Details LE strengthening. Standardized outcome, Gait training.    Person(s) Educated Patient   Methods Explanation;Demonstration;Verbal cues;Tactile cues   Comprehension Verbalized understanding;Returned demonstration;Verbal cues required;Tactile cues required             PT Long Term Goals - 06/08/15 1530    PT LONG TERM GOAL #1   Title Patient will be independent with HEP to improve strength, balance and gait to increase function with tasks at home by 08/31/15.   Time 12   Period Weeks   Status New   PT LONG TERM GOAL #2   Title Patient will increase bilateral LE strength to at least 4+/5 throughout the LE to allow patient to access stairs in the community with greater  ease by 08/31/15.   Time 12   Period Weeks   Status New   PT LONG TERM GOAL #3   Title Patient will score >46 on the Berg balance scale to reduce fall risk by 08/31/15.   Time 12   Period Weeks   Status New   PT LONG TERM GOAL #4   Title Patient will improve gait speed to >1.0 m/s with Least restrictive assistive device to reduce fall risk and increase ease of access to the community by 08/31/15.   Time 12   Period Weeks   Status  New   PT LONG TERM GOAL #5   Title Patient will score >40/80 on the LEFS to indicate improved function with daily tasks and decreased impairment by 08/31/15.   Time 12   Period Weeks   Status New               Plan - 06/14/15 1607    Clinical Impression Statement Patient re-educated in HEP with increased resistance and additional exercises for bilateral ankle. Patient also instructed in Berg balance scale. PT required to provide Moderate instruction for advanced LE home exercises for proper setup, increased ROM and proper resistance from tband. Patient responded well instruction with increased ankle ROM and improved eccentric control. Patient demonstrates high fall risk a score of  27 on the Berg balance scale; difficulty noted with all dynamic balance movements. Patient performed gait training within the therapy gym for 62ft x2 with RW and supervision assist from PT; only slight verbal instruction needed for increased safety with stand to sit upon completion of gait. Continued skilled PT is recommended to improve gait, improve LE strength, and improve balance to allow patient to return to PLOF.   Pt will benefit from skilled therapeutic intervention in order to improve on the following deficits Abnormal gait;Decreased activity tolerance;Decreased balance;Decreased coordination;Decreased endurance;Decreased knowledge of precautions;Decreased knowledge of use of DME;Decreased mobility;Decreased range of motion;Decreased strength;Difficulty walking;Increased  muscle spasms;Impaired perceived functional ability;Impaired flexibility;Impaired sensation;Improper body mechanics;Postural dysfunction;Obesity   Rehab Potential Good   Clinical Impairments Affecting Rehab Potential Positive: good family support, high PLOF. Negative: recent surgery, decreased sensation    PT Frequency 2x / week   PT Duration 12 weeks   PT Treatment/Interventions ADLs/Self Care Home Management;Cryotherapy;Electrical Stimulation;Moist Heat;DME Instruction;Gait training;Stair training;Functional mobility training;Therapeutic activities;Therapeutic exercise;Balance training;Neuromuscular re-education;Patient/family education;Orthotic Fit/Training;Wheelchair mobility training;Manual techniques;Passive range of motion;Energy conservation   PT Next Visit Plan Balance exercises, LE strengthening.    PT Home Exercise Plan advanced; see patient instructions    Recommended Other Services Also seeing OT   Consulted and Agree with Plan of Care Patient;Family member/caregiver        Problem List There are no active problems to display for this patient.  Grier Rocher SPT 06/15/2015   4:19 PM   Lynnea Maizes PT, DPT   Huprich,Jason 06/15/2015, 4:19 PM  This entire session was performed under direct supervision and direction of a licensed therapist/therapist assistant . I have personally read, edited and approve of the note as written.   [place] [facility] MAIN [facility] SERVICES [address] [place], [place], [address] Phone: [phone]   Fax:  [phone]

## 2015-06-16 ENCOUNTER — Ambulatory Visit: Payer: Medicare Other | Admitting: Occupational Therapy

## 2015-06-16 ENCOUNTER — Encounter: Payer: Self-pay | Admitting: Occupational Therapy

## 2015-06-16 DIAGNOSIS — R29898 Other symptoms and signs involving the musculoskeletal system: Secondary | ICD-10-CM | POA: Diagnosis not present

## 2015-06-16 NOTE — Therapy (Signed)
[place] [facility] MAIN [facility] SERVICES [address] [place], [place], [address] Phone: [phone]   Fax:  [phone]  Occupational Therapy Treatment  Patient Details  Name: Amber Bridges MRN: [id number] Date of Birth: March 08, 1941 Referring Provider:  Lubertha South, MD  Encounter Date: 06/16/2015      OT End of Session - 06/16/15 1505    Visit Number 3   Number of Visits 24   Date for OT Re-Evaluation 08/31/15   OT Start Time 1301   OT Stop Time 1345   OT Time Calculation (min) 44 min   Activity Tolerance Patient tolerated treatment well   Behavior During Therapy [facility] for tasks assessed/performed      Past Medical History  Diagnosis Date  . Depression   . Hypertension   . Bronchitis     Past Surgical History  Procedure Laterality Date  . Spine surgery  1973, 1978, 2016    Most recent Laminectomy to T11 and L3    There were no vitals filed for this visit.  Visit Diagnosis:  Leg weakness, bilateral      Subjective Assessment - 06/16/15 1322    Subjective  I cooked last night.   Pain Score 0-No pain   Pain Location Back    Spent today in kitchen transferring pots and pans and pouring water and getting water for coffee with cues for technique and safety. Patient needed rest periods and discussed safety and energy saving techniques during activities of daily living. Patient able to tolerate ~4 minutes of dynamic standing holding on to counter and up to 7 minutes with more static standing at the counter.                          OT Education - 06/16/15 1504    Education provided Yes   Education Details Educated in energy saving techniques during ADL   Person(s) Educated Patient   Methods Explanation   Comprehension Verbalized understanding             OT Long Term Goals - 06/08/15 1615    OT LONG TERM GOAL #1   Title Patient will improve standing balance to be able to cook.   Baseline Unable  to cook   Time 12   Period Weeks   Status New   OT LONG TERM GOAL #2   Title Will be able to safely pull up pants with both hands.   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   Baseline Unable to stand and use grinder.   Time 12   Period Weeks   Status New   OT LONG TERM GOAL #4   Title Will be able to retun to yard work.   Baseline Unable to do yard work.   Time 12   Period Weeks   Status New   OT LONG TERM GOAL #5   Title Will be able to feed her dog.   Baseline unable to feed her dog.   Time 12   Period Weeks   Status New   OT LONG TERM GOAL #6   Title will be able to get in and out of the shower   Baseline unable to get in and  out of the shower    Time 12   Period Weeks   Status New   OT LONG TERM GOAL #7   Title Will be able to get off a regular toilet   Baseline Unable to get off regular toilet (example in a public bathroom)   Time 12   Period Weeks   Status New   OT LONG TERM GOAL #8   Title Patient will be able to tolerate upright stance to vacuum.   Baseline Unable to vacuum   Time 12   Period Weeks   Status New               Plan - 06/16/15 1506    Clinical Impression Statement Patient improving with ADL and functional mobility during ADL.   Pt will benefit from skilled therapeutic intervention in order to improve on the following deficits (Retired) Abnormal gait;Decreased activity tolerance;Decreased balance;Decreased endurance;Decreased mobility;Decreased strength;Difficulty walking;Impaired perceived functional ability;Impaired flexibility;Improper body mechanics   OT Treatment/Interventions Self-care/ADL training;Therapeutic activities        Problem List There are no active problems to display for this patient.  [name], MS/OTR/L  [name] 06/16/2015, 3:09 PM  [facility] MAIN [facility] SERVICES [address] [place], [place], [address] Phone: [phone]   Fax:  [phone]

## 2015-06-16 NOTE — Patient Instructions (Addendum)
Instructed in safety to prevent falls while in kitchen. Also with energy saving techniques.

## 2015-06-21 ENCOUNTER — Encounter: Payer: Self-pay | Admitting: Occupational Therapy

## 2015-06-21 ENCOUNTER — Encounter: Payer: Self-pay | Admitting: Physical Therapy

## 2015-06-21 ENCOUNTER — Ambulatory Visit: Payer: Medicare Other | Admitting: Physical Therapy

## 2015-06-21 ENCOUNTER — Ambulatory Visit: Payer: Medicare Other | Admitting: Occupational Therapy

## 2015-06-21 DIAGNOSIS — R29898 Other symptoms and signs involving the musculoskeletal system: Secondary | ICD-10-CM

## 2015-06-21 DIAGNOSIS — M545 Low back pain, unspecified: Secondary | ICD-10-CM

## 2015-06-21 DIAGNOSIS — R269 Unspecified abnormalities of gait and mobility: Secondary | ICD-10-CM

## 2015-06-21 NOTE — Therapy (Signed)
[place] [facility] MAIN [facility] SERVICES [address] [place], [place], [address] Phone: [phone]   Fax:  [phone]  Physical Therapy Treatment  Patient Details  Name: Amber Bridges MRN: [id number] Date of Birth: 24-Jun-1941 Referring Provider:  Lubertha South, MD  Encounter Date: 06/21/2015      PT End of Session - 06/22/15 0800    Visit Number 3   Number of Visits 25   Date for PT Re-Evaluation 08/31/15   Authorization Type 3   Authorization Time Period Gcode 10    PT Start Time 1548   PT Stop Time 1632   PT Time Calculation (min) 44 min   Equipment Utilized During Treatment Gait belt   Activity Tolerance Patient tolerated treatment well   Behavior During Therapy [facility] for tasks assessed/performed      Past Medical History  Diagnosis Date  . Depression   . Hypertension   . Bronchitis     Past Surgical History  Procedure Laterality Date  . Spine surgery  1973, 1978, 2016    Most recent Laminectomy to T11 and L3    There were no vitals filed for this visit.  Visit Diagnosis:  Leg weakness, bilateral  Bilateral low back pain without sciatica  Abnormality of gait      Subjective Assessment - 06/21/15 1558    Subjective Patient reports that she is doing well upon arrival to PT . She states that she has been compliant with HEP 2 times a day, every day. She also reports that she has recently noticed improve movement in her L Leg in the bed.    Patient is accompained by: Family member   Pertinent History Previous back surgery in 1973 at L5 for disc decompression and then scar tissue removal in 1978. She states that she has had back problems that would present every couple of years and usually get better with rest. Patient also has a history of multiple falls.   Limitations Standing;Walking   How long can you sit comfortably? Able to sit as long as needed    How long can you stand comfortably? able to stand for for up to 6  minutes without holding on    How long can you walk comfortably? States that she can walk up to 100 ft at home with RW.    Diagnostic tests MRI 04/27/15 revealed severe spinal stenosis at L3-L4 and partial stenosis at T11-L3 and L5-S1. Patient underwent spinal Laminectomies of T11-T12 and L3-L4 on 04/28/15.    Patient Stated Goals Be able to walk with least restrictive device. Wants to be able to drive and go to store and shop for herself   Currently in Pain? No/denies   Pain Onset Today            Treatment:  Nustep level 3, 4 minutes, LE only (unbilled)   Seated therex with red tband   BLE hip flexion x12  BLE hip abduction x12 BLE knee flexion x12  BLE knee extension x12  BLE ankle DF x12  BLE ankle inversion x12  BLE ankle Eversion x12  BLE ankle PF x12   PT provided min verbal instruction to increase control of eccentric movements, decreased trunk and hip compensation as needed, and increase ROM on to improve strengthening. Slightly reduced resistance applied to the L ankle compared to the R due to decreased strength, but patient was noted to have improved L ankle DF, inversion, and eversion compared to previous PT session.  Bed mobility:   Sit <> supine x4 Roll to L from Supine x5  Roll to R from supine x5  stabilizing reversals to the pelvis for R and L rolling x5 each direction  PT provided Moderate verbal and tactile instruction to increase proper UE use into D1 flexion pattern to initiate roll as well as increased D1 LE extension to increase trunk and pelvic rotation. Cues also provided to increase stabilization with dynamic stabilization to the pelvis for R and L rolling. Patient responded very well to instruction and was noted to have improve rolling technique to the R and LE with decreased difficulty from the start of the session.   Gait training.  225 ft with RW and CGA provided by PT to increase safety.  PT provided min verbal instruction to increase L stance  time, increase R LE step length and increase terminal knee extension bilaterally to facilitate improve heel contact. Patient responded very well to instruction.                          PT Education - 06/22/15 0759    Education provided Yes   Education Details LE strengthening. bed mobility, gait training    Person(s) Educated Patient   Methods Explanation;Demonstration;Verbal cues;Tactile cues   Comprehension Verbalized understanding;Returned demonstration;Verbal cues required;Tactile cues required             PT Long Term Goals - 06/08/15 1530    PT LONG TERM GOAL #1   Title Patient will be independent with HEP to improve strength, balance and gait to increase function with tasks at home by 08/31/15.   Time 12   Period Weeks   Status New   PT LONG TERM GOAL #2   Title Patient will increase bilateral LE strength to at least 4+/5 throughout the LE to allow patient to access stairs in the community with greater ease by 08/31/15.   Time 12   Period Weeks   Status New   PT LONG TERM GOAL #3   Title Patient will score >46 on the Berg balance scale to reduce fall risk by 08/31/15.   Time 12   Period Weeks   Status New   PT LONG TERM GOAL #4   Title Patient will improve gait speed to >1.0 m/s with Least restrictive assistive device to reduce fall risk and increase ease of access to the community by 08/31/15.   Time 12   Period Weeks   Status New   PT LONG TERM GOAL #5   Title Patient will score >40/80 on the LEFS to indicate improved function with daily tasks and decreased impairment by 08/31/15.   Time 12   Period Weeks   Status New               Plan - 06/22/15 0801    Clinical Impression Statement Patient instructed in LE strengthening, bed mobility and Gait training. Moderate verbal and tactile instruction provided with bed mobility including increased use of UE shoulder horizontal abduction/abduction to initiate movement as well as increase D1  LE extension pattern to facilitate pelvic/trunk rotation. Patient responded very well to instruction. Min verbal instruction provided with gait training and LE strengthening for improved mechanics and movement control. Patient demonstrated improved LE strength, requiring increased resistance to improve strengthening. Improve gait endurance also noted with the ability to ambulate with CGA for up to 254ft. Continued skilled PT is recommended to improve LE strength, improve Gait, and improve  balance to increase independence with ALDs.   Pt will benefit from skilled therapeutic intervention in order to improve on the following deficits Abnormal gait;Decreased activity tolerance;Decreased balance;Decreased coordination;Decreased endurance;Decreased knowledge of precautions;Decreased knowledge of use of DME;Decreased mobility;Decreased range of motion;Decreased strength;Difficulty walking;Increased muscle spasms;Impaired perceived functional ability;Impaired flexibility;Impaired sensation;Improper body mechanics;Postural dysfunction;Obesity   Rehab Potential Good   Clinical Impairments Affecting Rehab Potential Positive: good family support, high PLOF. Negative: recent surgery, decreased sensation    PT Frequency 2x / week   PT Duration 12 weeks   PT Treatment/Interventions ADLs/Self Care Home Management;Cryotherapy;Electrical Stimulation;Moist Heat;DME Instruction;Gait training;Stair training;Functional mobility training;Therapeutic activities;Therapeutic exercise;Balance training;Neuromuscular re-education;Patient/family education;Orthotic Fit/Training;Wheelchair mobility training;Manual techniques;Passive range of motion;Energy conservation   PT Next Visit Plan Standing LE therex, Gait training.    PT Home Exercise Plan continue as give with increased resistance.    Consulted and Agree with Plan of Care Patient;Family member/caregiver        Problem List There are no active problems to display for this  patient.  Grier Rocher SPT 06/22/2015   10:53 AM  This entire session was performed under direct supervision and direction of a licensed therapist. I have personally read, edited and approve of the note as written.  Hopkins,Margaret PT, DPT 06/22/2015, 10:53 AM  [place] [facility] MAIN [facility] SERVICES [address] [place], [place], [address] Phone: [phone]   Fax:  [phone]

## 2015-06-21 NOTE — Patient Instructions (Signed)
Reviewed joint protection and energy saving techniques during activities of daily living.

## 2015-06-21 NOTE — Therapy (Signed)
[place] MAIN [facility] SERVICES [address] [place], [place], [address] Phone: [phone]   Fax:  [phone]  Occupational Therapy Treatment  Patient Details  Name: Amber Bridges MRN: [id number] Date of Birth: [birth year] Referring Provider:  Juanda Bond, MD  Encounter Date: 06/21/2015      OT End of Session - 06/21/15 1556    Visit Number 4   Number of Visits 24   Date for OT Re-Evaluation 08/31/15   OT Start Time 1503   OT Stop Time 1545   OT Time Calculation (min) 42 min   Behavior During Therapy [facility] for tasks assessed/performed      Past Medical History  Diagnosis Date  . Depression   . Hypertension   . Bronchitis     Past Surgical History  Procedure Laterality Date  . Spine surgery  1973, 1978, 2016    Most recent Laminectomy to T11 and L3    There were no vitals filed for this visit.  Visit Diagnosis:  Leg weakness, bilateral  Bilateral low back pain without sciatica      Subjective Assessment - 06/21/15 1553    Subjective  I cooked a whole meal yesterday, but I  spread it out through out the day.    completed various standing and reaching activities simulating activities of daily living at home. Reviewed energy saving techniques and joint protection to prevent low back pain.Patient has achieved 5 of 8 goals listed.                          OT Education - 06/21/15 1555    Education provided Yes   Education Details Reviewed body mechanics to prevent back pain.             OT Long Term Goals - 06/21/15 1527    OT LONG TERM GOAL #1   Status Achieved   OT LONG TERM GOAL #2   Status Achieved   OT LONG TERM GOAL #5   Status Achieved   OT LONG TERM GOAL #6   Status Achieved   OT LONG TERM GOAL #7   Status Achieved               Plan - 06/21/15 1600    Clinical Impression Statement and pulling her pants up.        Problem List There are no active  problems to display for this patient.   Sharon Mt 06/21/2015, 4:03 PM  [place] MAIN [facility] SERVICES [address] [place], [place], [address] Phone: [phone]   Fax:  [phone]

## 2015-06-23 ENCOUNTER — Ambulatory Visit: Payer: Medicare Other | Admitting: Physical Therapy

## 2015-06-23 ENCOUNTER — Encounter: Payer: Self-pay | Admitting: Occupational Therapy

## 2015-06-23 ENCOUNTER — Ambulatory Visit: Payer: Medicare Other | Admitting: Occupational Therapy

## 2015-06-23 ENCOUNTER — Encounter: Payer: Self-pay | Admitting: Physical Therapy

## 2015-06-23 DIAGNOSIS — R29898 Other symptoms and signs involving the musculoskeletal system: Secondary | ICD-10-CM

## 2015-06-23 DIAGNOSIS — M545 Low back pain, unspecified: Secondary | ICD-10-CM

## 2015-06-23 DIAGNOSIS — R269 Unspecified abnormalities of gait and mobility: Secondary | ICD-10-CM

## 2015-06-23 NOTE — Therapy (Signed)
[place] MAIN [facility] SERVICES [address] [place], [place], [address] Phone: [phone]   Fax:  [phone]  Occupational Therapy Treatment  Patient Details  Name: Amber Bridges MRN: [id number] Date of Birth: [birth year] Referring Provider:  Juanda Bond, MD  Encounter Date: 06/23/2015      OT End of Session - 06/23/15 1544    Visit Number 5   Number of Visits 24   Date for OT Re-Evaluation 08/31/15   OT Start Time 6226   OT Stop Time 1500   OT Time Calculation (min) 15 min   Behavior During Therapy [facility] for tasks assessed/performed      Past Medical History  Diagnosis Date  . Depression   . Hypertension   . Bronchitis     Past Surgical History  Procedure Laterality Date  . Spine surgery  1973, 1978, 2016    Most recent Laminectomy to T11 and L3    There were no vitals filed for this visit.  Visit Diagnosis:  Leg weakness, bilateral  Bilateral low back pain without sciatica      Subjective Assessment - 06/23/15 1543    Subjective  I vacumed yesterday    Standing activities involving reaching and transferring activities of daily living objects several times. Longest stance was 4 min and 24 seconds. Completed 2 sets of standing and reaching up and placing objects above the head.                               OT Long Term Goals - 06/21/15 1527    OT LONG TERM GOAL #1   Status Achieved   OT LONG TERM GOAL #2   Status Achieved   OT LONG TERM GOAL #5   Status Achieved   OT LONG TERM GOAL #6   Status Achieved   OT LONG TERM GOAL #7   Status Achieved               Plan - 06/23/15 1545    Clinical Impression Statement Patient is doing well with her goals, will consider discharge next week.   Pt will benefit from skilled therapeutic intervention in order to improve on the following deficits (Retired) Abnormal gait;Decreased activity tolerance;Decreased balance;Decreased  endurance;Decreased mobility;Decreased strength;Difficulty walking;Impaired perceived functional ability;Impaired flexibility;Improper body mechanics   OT Treatment/Interventions Self-care/ADL training;Therapeutic activities        Problem List There are no active problems to display for this patient.  Sharon Mt, MS/OTR/L  Sharon Mt 06/23/2015, 3:47 PM  [place] MAIN [facility] SERVICES [address] [place], [place], [address] Phone: [phone]   Fax:  [phone]

## 2015-06-24 NOTE — Therapy (Addendum)
[place] [facility] MAIN [facility] SERVICES [address] [place], [place], [address] Phone: [phone]   Fax:  [phone]  Physical Therapy Treatment  Patient Details  Name: Amber Bridges MRN: [id number] Date of Birth: December 29, 1940 Referring Provider:  Lubertha South, MD  Encounter Date: 06/23/2015      PT End of Session - 06/23/15 1753    Visit Number 4   Number of Visits 25   Date for PT Re-Evaluation 08/31/15   Authorization Type 4   Authorization Time Period Gcode 10    PT Start Time 1530   PT Stop Time 1615   PT Time Calculation (min) 45 min   Equipment Utilized During Treatment Gait belt   Activity Tolerance Patient tolerated treatment well   Behavior During Therapy [facility] for tasks assessed/performed      Past Medical History  Diagnosis Date  . Depression   . Hypertension   . Bronchitis     Past Surgical History  Procedure Laterality Date  . Spine surgery  1973, 1978, 2016    Most recent Laminectomy to T11 and L3    There were no vitals filed for this visit.  Visit Diagnosis:  Leg weakness, bilateral  Bilateral low back pain without sciatica  Abnormality of gait      Subjective Assessment - 06/23/15 1541    Subjective Patient reports that she is doing very well upon arrival to PT. She states that she has been able to over in bed with greater ease using PNF patterns to faciliate movement. She states that she continues to be compliant with HEP.    Patient is accompained by: Family member   Pertinent History Previous back surgery in 1973 at L5 for disc decompression and then scar tissue removal in 1978. She states that she has had back problems that would present every couple of years and usually get better with rest. Patient also has a history of multiple falls.   Limitations Standing;Walking   How long can you sit comfortably? Able to sit as long as needed    How long can you stand comfortably? able to stand for for up  to 6 minutes without holding on    How long can you walk comfortably? States that she can walk up to 100 ft at home with RW.    Diagnostic tests MRI 04/27/15 revealed severe spinal stenosis at L3-L4 and partial stenosis at T11-L3 and L5-S1. Patient underwent spinal Laminectomies of T11-T12 and L3-L4 on 04/28/15.    Patient Stated Goals Be able to walk with least restrictive device. Wants to be able to drive and go to store and shop for herself   Currently in Pain? No/denies   Pain Onset --        Treatment:   Nustep level 3, 4 minutes (unbilled)   Gait training  35ft x2 RW, AFO on only the L LE 49ft x2 RW, bilateral AFO   PT provided CGA to increase patient safety with gait training. Min verbal instruction to increase heel strike with bilateral LE and increase step length on the L LE as well as cues to decrease UE pressure on RW to maintain forward progression of AD. Patient responded moderately to verbal instruction with increased terminal knee extension and heel strike bilaterally. Patient was noted to have increased R foot inversion of the R LE without AFO during swing through, able to correct <15% of the time.   Therapeutic exercise:   Seated therex with Red tband  BLE ankle PF 2x10  BLE ankle DF 2x10 BLE anke inversion 2x10   BLE ankle eversion 2x10  BLE knee extension 2x10  BLE knee flexion 2x10  BLE hip flexion 2x10  BLE hip abduction 2x10   Standing therex with red tband  BLE Hip flexion x10 BLE Hip extension x10  BLE Hip abduction x10   PT provided min verbal and tactile instruction to decrease compensatory movement of the hip with ankle exercise and decreased trunk compensation with hip exercises as well as cues to increase eccentric motion to increase strengthening. Patient responded very well to instruction and reports that this was the first time that she was able to perform standing hip extension and abduction.                             PT  Education - 06/23/15 1753    Education provided Yes   Education Details LE strengthening, gait training.   Person(s) Educated Patient   Methods Explanation;Demonstration;Tactile cues;Verbal cues   Comprehension Verbalized understanding;Returned demonstration;Verbal cues required             PT Long Term Goals - 06/08/15 1530    PT LONG TERM GOAL #1   Title Patient will be independent with HEP to improve strength, balance and gait to increase function with tasks at home by 08/31/15.   Time 12   Period Weeks   Status New   PT LONG TERM GOAL #2   Title Patient will increase bilateral LE strength to at least 4+/5 throughout the LE to allow patient to access stairs in the community with greater ease by 08/31/15.   Time 12   Period Weeks   Status New   PT LONG TERM GOAL #3   Title Patient will score >46 on the Berg balance scale to reduce fall risk by 08/31/15.   Time 12   Period Weeks   Status New   PT LONG TERM GOAL #4   Title Patient will improve gait speed to >1.0 m/s with Least restrictive assistive device to reduce fall risk and increase ease of access to the community by 08/31/15.   Time 12   Period Weeks   Status New   PT LONG TERM GOAL #5   Title Patient will score >40/80 on the LEFS to indicate improved function with daily tasks and decreased impairment by 08/31/15.   Time 12   Period Weeks   Status New               Plan - 06/23/15 1754    Clinical Impression Statement Patient instructed in LE strengthening and gait training. Gait training performed with CGA , RW and with AFO on only the L LE. Patient demonstrated increased inversion with swing through on the R LE without AFO; only slight correction while performing gait training. LE strengthening performed with verbal and tactile instruction for increased ROM and increased eccentric control, paitnet responded well to instruction and performed standing hip abduction for the first time since injury. Continued  skilled PT is recommended to improve gait, balance and strength to improve indepencen with ADLs..    Pt will benefit from skilled therapeutic intervention in order to improve on the following deficits Abnormal gait;Decreased activity tolerance;Decreased balance;Decreased coordination;Decreased endurance;Decreased knowledge of precautions;Decreased knowledge of use of DME;Decreased mobility;Decreased range of motion;Decreased strength;Difficulty walking;Increased muscle spasms;Impaired perceived functional ability;Impaired flexibility;Impaired sensation;Improper body mechanics;Postural dysfunction;Obesity   Rehab Potential Good   Clinical  Impairments Affecting Rehab Potential Positive: good family support, high PLOF. Negative: recent surgery, decreased sensation    PT Frequency 2x / week   PT Duration 12 weeks   PT Treatment/Interventions ADLs/Self Care Home Management;Cryotherapy;Electrical Stimulation;Moist Heat;DME Instruction;Gait training;Stair training;Functional mobility training;Therapeutic activities;Therapeutic exercise;Balance training;Neuromuscular re-education;Patient/family education;Orthotic Fit/Training;Wheelchair mobility training;Manual techniques;Passive range of motion;Energy conservation   PT Next Visit Plan Standing LE therex, Gait training.    PT Home Exercise Plan continue as given   Consulted and Agree with Plan of Care Patient;Family member/caregiver        Problem List There are no active problems to display for this patient.  Grier Rocher SPT 06/24/2015   11:47 AM   Alva Garnet 06/24/2015, 11:47 AM   This entire session was performed under direct supervision and direction of a licensed therapist/therapist assistant . I have personally read, edited and approve of the note as written.  Kerin Ransom, PT, DPT    [place] [facility] MAIN [facility] SERVICES [address] [place], [place], [address] Phone: [phone]   Fax:   [phone]

## 2015-06-28 ENCOUNTER — Ambulatory Visit: Payer: Medicare Other | Admitting: Physical Therapy

## 2015-06-28 ENCOUNTER — Ambulatory Visit: Payer: Medicare Other | Admitting: Occupational Therapy

## 2015-06-28 DIAGNOSIS — R269 Unspecified abnormalities of gait and mobility: Secondary | ICD-10-CM

## 2015-06-28 DIAGNOSIS — R29898 Other symptoms and signs involving the musculoskeletal system: Secondary | ICD-10-CM

## 2015-06-28 DIAGNOSIS — M545 Low back pain, unspecified: Secondary | ICD-10-CM

## 2015-06-28 NOTE — Therapy (Signed)
[place] [facility] MAIN [facility] SERVICES [address] [place], [place], [address] Phone: [phone]   Fax:  [phone]  Occupational Therapy Treatment/Dishcarge Summary  Patient Details  Name: Amber Bridges MRN: [id number] Date of Birth: 1941-02-08 Referring Provider:  Lubertha South, MD  Encounter Date: 06/28/2015      OT End of Session - 06/28/15 1506    Visit Number 6   Number of Visits 24   Date for OT Re-Evaluation 08/31/15   Behavior During Therapy [facility] for tasks assessed/performed      Past Medical History  Diagnosis Date  . Depression   . Hypertension   . Bronchitis     Past Surgical History  Procedure Laterality Date  . Spine surgery  1973, 1978, 2016    Most recent Laminectomy to T11 and L3    There were no vitals filed for this visit.  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 Reports a fall today.   Currently in Pain? No/denies     Used Judy/instructo board to transfer laterally while standing. Reviewed all goals and achieved 7 of 8 goals listed  below.  No further Occupational Therapy needed at this time.                                OT Long Term Goals - 06/28/15 1646    OT LONG TERM GOAL #1   Title Patient will improve standing balance to be able to cook.   Baseline Unable to cook   Time 12   Period Weeks   Status Achieved   OT LONG TERM GOAL #2   Title Will be able to safely pull up pants with both hands.   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   Baseline Unable to stand and use grinder.   Time 12   Period Weeks   Status Achieved   OT LONG TERM GOAL #4   Title  Will be able to retun to yard work.   Baseline Unable to do yard work.   Time 12   Period Weeks   Status Not Met   OT LONG TERM GOAL #5   Title Will be able to feed her dog.   Baseline unable to feed her dog.   Time 12   Period Weeks   Status Achieved   OT LONG TERM GOAL #6   Title will be able to get in and out of the shower   Baseline unable to get in and out of the shower    Time 12   Period Weeks   Status Achieved   OT LONG TERM GOAL #7   Title Will be able to get off a regular toilet   Baseline Unable to get off regular toilet (example in a public bathroom)   Time 12   Period Weeks   Status Achieved   OT LONG TERM GOAL #8   Title Patient will be able to tolerate upright stance to vacuum.   Baseline Unable to vacuum   Time 12  Period Weeks   Status Achieved               Plan - 07/05/2015 1506    Clinical Impression Statement Patinet has accomplishe 7 of 8 goals. She is loading dishwasher, cooking full meals, feeding her dog, pulling up pants, running vaccum and dusting, She has returned to making stain glass. Patient's arms are strong, coordination is good. No further Occupational Therapy.   Pt will benefit from skilled therapeutic intervention in order to improve on the following deficits (Retired) Abnormal gait;Decreased activity tolerance;Decreased balance;Decreased endurance;Decreased mobility;Decreased strength;Difficulty walking;Impaired perceived functional ability;Impaired flexibility;Improper body mechanics   OT Treatment/Interventions Self-care/ADL training;Therapeutic activities   Consulted and Agree with Plan of Care Patient;Family member/caregiver          G-Codes - 2015-07-05 1459    Functional Assessment Tool Used clinical judgment. and patient report   Functional Limitation Self care   Self Care Current Status [phone]) At least 40 percent but less than 60 percent impaired, limited or restricted   Self Care Goal Status (L2440) At least 1 percent  but less than 20 percent impaired, limited or restricted   Self Care Discharge Status [phone]) At least 1 percent but less than 20 percent impaired, limited or restricted      Problem List There are no active problems to display for this patient.   [name] Jul 05, 2015, 4:48 PM  [facility] MAIN [facility] SERVICES [address] [place], [place], [address] Phone: [phone]   Fax:  [phone]

## 2015-06-28 NOTE — Therapy (Signed)
[place] [facility] MAIN [facility] SERVICES [address] [place], [place], [address] Phone: [phone]   Fax:  [phone]  Physical Therapy Treatment  Patient Details  Name: Amber Bridges MRN: [id number] Date of Birth: 07/30/1941 Referring Provider:  Lubertha South, MD  Encounter Date: 06/28/2015      PT End of Session - 06/28/15 1551    Visit Number 5   Number of Visits 25   Date for PT Re-Evaluation 08/31/15   Authorization Type 5   Authorization Time Period Gcode 10    PT Start Time 1531   PT Stop Time 1620   PT Time Calculation (min) 49 min   Equipment Utilized During Treatment Gait belt   Activity Tolerance Patient tolerated treatment well   Behavior During Therapy [facility] for tasks assessed/performed      Past Medical History  Diagnosis Date  . Depression   . Hypertension   . Bronchitis     Past Surgical History  Procedure Laterality Date  . Spine surgery  1973, 1978, 2016    Most recent Laminectomy to T11 and L3    There were no vitals filed for this visit.  Visit Diagnosis:  Leg weakness, bilateral  Bilateral low back pain without sciatica  Abnormality of gait      Subjective Assessment - 06/28/15 1539    Subjective Patient reports that she is okay. Reports  that she fell this morning while trying to put her pants on, and landed on her left hip. She states that she has no pain in the hip upon arrival to PT. Help was required from her children to get off the floor.     Patient is accompained by: Family member   Pertinent History Previous back surgery in 1973 at L5 for disc decompression and then scar tissue removal in 1978. She states that she has had back problems that would present every couple of years and usually get better with rest. Patient also has a history of multiple falls.   Limitations Standing;Walking   How long can you sit comfortably? Able to sit as long as needed    How long can you stand comfortably?  able to stand for for up to 6 minutes without holding on    How long can you walk comfortably? States that she can walk up to 100 ft at home with RW.    Diagnostic tests MRI 04/27/15 revealed severe spinal stenosis at L3-L4 and partial stenosis at T11-L3 and L5-S1. Patient underwent spinal Laminectomies of T11-T12 and L3-L4 on 04/28/15.    Patient Stated Goals Be able to walk with least restrictive device. Wants to be able to drive and go to store and shop for herself   Currently in Pain? No/denies         Treatment:   Nustep level 3, 4 minutes (unbilled)   Gait training with RW ,10 ft without Bilateral AFO x4  Stair training, BUE support on rails, 4 steps x3  PT provided CGA to increase safety as well as min-mod verbal instruction for proper step to gait pattern on stairs and increased step length and increased heel strike on B LE. Patient noted to have decreased heel contact on BLE and increased inversion with swing through on the R LE. No knee instability noted with gait without AFO.    Therapeutic exercise:   Seated therex with Red tband  BLE ankle PF 2x10  BLE ankle DF 2x10 BLE anke inversion 2x10  BLE ankle  eversion 2x10  BLE knee extension 2x10  BLE knee flexion 2x10  BLE hip flexion 2x10  BLE hip abduction 2x10   Standing therex with red tband  BLE Hip flexion x10 BLE Hip extension x10  BLE Hip abduction x10    Self calf stretch with towel roll. 2x20 seconds  PT provided min verbal instruction to minimize compensation of the hip/knee with ankle strengthening as well as cues to increase eccentric control and increase ROM. Patient responded well to instruction from PT. Noted to have continued decreased movement of the L LE compared to the L, but patient states that she has more motion into PF than she has seen since admission to the hospital.                       PT Education - 06/28/15 1551    Education provided Yes   Education Details  stair training, gait training, LE strengthening.    Person(s) Educated Patient   Methods Explanation;Demonstration;Tactile cues   Comprehension Verbalized understanding;Returned demonstration;Verbal cues required             PT Long Term Goals - 06/08/15 1530    PT LONG TERM GOAL #1   Title Patient will be independent with HEP to improve strength, balance and gait to increase function with tasks at home by 08/31/15.   Time 12   Period Weeks   Status New   PT LONG TERM GOAL #2   Title Patient will increase bilateral LE strength to at least 4+/5 throughout the LE to allow patient to access stairs in the community with greater ease by 08/31/15.   Time 12   Period Weeks   Status New   PT LONG TERM GOAL #3   Title Patient will score >46 on the Berg balance scale to reduce fall risk by 08/31/15.   Time 12   Period Weeks   Status New   PT LONG TERM GOAL #4   Title Patient will improve gait speed to >1.0 m/s with Least restrictive assistive device to reduce fall risk and increase ease of access to the community by 08/31/15.   Time 12   Period Weeks   Status New   PT LONG TERM GOAL #5   Title Patient will score >40/80 on the LEFS to indicate improved function with daily tasks and decreased impairment by 08/31/15.   Time 12   Period Weeks   Status New               Plan - 06/28/15 1720    Clinical Impression Statement Patient instructed in gait training, stair training, and LE strengthening. Patient performed stair training with CGA from PT to increase safety and use of bilateral rail support. Verbal instruction provided by PT for proper step to gait pattern. PT also provided min verbal instruction for increased ROM and increased control of eccentric movement. Gait training performed without Bilateral AFO for short distances; moderate instruction for AD management and increased heel strike provided by PT. Patient demonstrated safe gait without AFO but was noted to have  decreased heel strike bilaterally.  Continued skilled PT is recommended to increase LE strength, improve gait, and decrease dependence on AD to increase safety within the home and reduce fall risk.    Pt will benefit from skilled therapeutic intervention in order to improve on the following deficits Abnormal gait;Decreased activity tolerance;Decreased balance;Decreased coordination;Decreased endurance;Decreased knowledge of precautions;Decreased knowledge of use of DME;Decreased mobility;Decreased range of motion;Decreased  strength;Difficulty walking;Increased muscle spasms;Impaired perceived functional ability;Impaired flexibility;Impaired sensation;Improper body mechanics;Postural dysfunction;Obesity   Rehab Potential Good   Clinical Impairments Affecting Rehab Potential Positive: good family support, high PLOF. Negative: recent surgery, decreased sensation    PT Frequency 2x / week   PT Duration 12 weeks   PT Treatment/Interventions ADLs/Self Care Home Management;Cryotherapy;Electrical Stimulation;Moist Heat;DME Instruction;Gait training;Stair training;Functional mobility training;Therapeutic activities;Therapeutic exercise;Balance training;Neuromuscular re-education;Patient/family education;Orthotic Fit/Training;Wheelchair mobility training;Manual techniques;Passive range of motion;Energy conservation   PT Next Visit Plan Standing LE therex, Gait training.    PT Home Exercise Plan continue as given   Consulted and Agree with Plan of Care Patient;Family member/caregiver        Problem List There are no active problems to display for this patient.  Grier Rocher SPT 06/29/2015   3:03 PM  This entire session was performed under direct supervision and direction of a licensed therapist/. I have personally read, edited and approve of the note as written.  Hopkins,Margaret PT, DPT 06/29/2015, 3:03 PM  [place] [facility] MAIN [facility] SERVICES [address]  [place], [place], [address] Phone: [phone]   Fax:  [phone]

## 2015-06-30 ENCOUNTER — Encounter: Payer: Medicare Other | Admitting: Occupational Therapy

## 2015-06-30 ENCOUNTER — Ambulatory Visit: Payer: Medicare Other | Admitting: Physical Therapy

## 2015-06-30 ENCOUNTER — Encounter: Payer: Self-pay | Admitting: Physical Therapy

## 2015-06-30 DIAGNOSIS — R269 Unspecified abnormalities of gait and mobility: Secondary | ICD-10-CM

## 2015-06-30 DIAGNOSIS — R29898 Other symptoms and signs involving the musculoskeletal system: Secondary | ICD-10-CM

## 2015-06-30 DIAGNOSIS — M545 Low back pain, unspecified: Secondary | ICD-10-CM

## 2015-06-30 NOTE — Therapy (Signed)
[facility] MAIN [facility] SERVICES [address] [place], [place], [address] Phone: [phone]   Fax:  [phone]  Physical Therapy Treatment  Patient Details  Name: Amber Bridges MRN: [id number] Date of Birth: [birth year] Referring Provider:  Lubertha South, MD  Encounter Date: 06/30/2015      PT End of Session - 06/30/15 1725    Visit Number 6   Number of Visits 25   Date for PT Re-Evaluation 08/31/15   Authorization Type 6   Authorization Time Period Gcode 10    PT Start Time 1530   PT Stop Time 1617   PT Time Calculation (min) 47 min   Equipment Utilized During Treatment Gait belt   Activity Tolerance Patient tolerated treatment well   Behavior During Therapy [facility] for tasks assessed/performed      Past Medical History  Diagnosis Date  . Depression   . Hypertension   . Bronchitis     Past Surgical History  Procedure Laterality Date  . Spine surgery  1973, 1978, 2016    Most recent Laminectomy to T11 and L3    There were no vitals filed for this visit.  Visit Diagnosis:  Leg weakness, bilateral  Bilateral low back pain without sciatica  Abnormality of gait      Subjective Assessment - 06/30/15 1534    Subjective Patient states that she is doing well upon arrival to PT. She reports that she went to the store using the RW for the first time today since her admission to the hospital, and plans on going out to eat for the first time as well. She reports that she started going very short distances in the home without AFOs.    Patient is accompained by: Family member   Pertinent History Previous back surgery in 1973 at L5 for disc decompression and then scar tissue removal in 1978. She states that she has had back problems that would present every couple of years and usually get better with rest. Patient also has a history of multiple falls.   Limitations Standing;Walking   How long can you sit comfortably? Able to sit  as long as needed    How long can you stand comfortably? able to stand for for up to 6 minutes without holding on    How long can you walk comfortably? States that she can walk up to 100 ft at home with RW.    Diagnostic tests MRI 04/27/15 revealed severe spinal stenosis at L3-L4 and partial stenosis at T11-L3 and L5-S1. Patient underwent spinal Laminectomies of T11-T12 and L3-L4 on 04/28/15.    Patient Stated Goals Be able to walk with least restrictive device. Wants to be able to drive and go to store and shop for herself   Currently in Pain? No/denies            [facility] PT Assessment - 07/01/15 0001    6 Minute Walk- Baseline   6 Minute Walk- Baseline yes   BP (mmHg) 126/44 mmHg   HR (bpm) 65   02 Sat (%RA) 98 %   Modified Borg Scale for Dyspnea 0- Nothing at all   Perceived Rate of Exertion (Borg) 7- Very, very light   6 Minute walk- Post Test   6 Minute Walk Post Test yes   BP (mmHg) 158/46 mmHg   HR (bpm) 76   02 Sat (%RA) 100 %   Modified Borg Scale for Dyspnea 2- Mild shortness of breath   Perceived  Rate of Exertion (Borg) 15- Hard   6 minute walk test results    Aerobic Endurance Distance Walked 500   Endurance additional comments Patient utilized RW. (<1000 ft indicates decreased community ambulation and increased fall risk)_          Treatment :  Nustep level 3, 3 minutes, (unbilled)   Sitting LE therex with red tband  BLE Hip flexion x12  BLE hip abduction x12  BLE knee extension x12  BLE knee flexion x12    Min verbal instruction for proper LE positioning, increase ROM on the L LE and improved eccentric control with BLE. Patient responded very well to instruction   PT instructed patient in the 6 minute walk test. Patient demonstrated improved AD management compared to previous PT treatment sessions with decreased UE support and continual forward motion. One rest break needed during the 6 minute walk test.   Floor to bed transfer training.  PT provided  moderate visual, verbal and tactile instruction for proper sequencing of movements, increased use of pillows under the knees for increased comfort, and Proper UE and LE positioning to increase mechanical advantage while coming to standing position from half kneeling. Patient responded very well to instruction, and required only Min A throughout the transfer for increased safety and proper LE positioning. PT provided patient with hand out for step to complete floor to chair/bed transfer.                            PT Education - 06/30/15 1724    Education provided Yes   Education Details floor transfers, 6 minute walk test, LE stengthening.    Person(s) Educated Patient   Methods Explanation;Demonstration;Tactile cues;Verbal cues   Comprehension Verbalized understanding;Returned demonstration;Verbal cues required;Tactile cues required             PT Long Term Goals - 06/08/15 1530    PT LONG TERM GOAL #1   Title Patient will be independent with HEP to improve strength, balance and gait to increase function with tasks at home by 08/31/15.   Time 12   Period Weeks   Status New   PT LONG TERM GOAL #2   Title Patient will increase bilateral LE strength to at least 4+/5 throughout the LE to allow patient to access stairs in the community with greater ease by 08/31/15.   Time 12   Period Weeks   Status New   PT LONG TERM GOAL #3   Title Patient will score >46 on the Berg balance scale to reduce fall risk by 08/31/15.   Time 12   Period Weeks   Status New   PT LONG TERM GOAL #4   Title Patient will improve gait speed to >1.0 m/s with Least restrictive assistive device to reduce fall risk and increase ease of access to the community by 08/31/15.   Time 12   Period Weeks   Status New   PT LONG TERM GOAL #5   Title Patient will score >40/80 on the LEFS to indicate improved function with daily tasks and decreased impairment by 08/31/15.   Time 12   Period Weeks    Status New               Plan - 06/30/15 1726    Clinical Impression Statement Patient instructed in 6 minutes walk test, LE strengthening and floor transfers. Slight instruction provided by PT for LE strengthening for improved ROM and eccentric control.  Patient demonstrated decreased community ambulation on the 6 minute walk test with a distance of 5104ft and one rest break required, but was noted to have improve AD management and decreased use of UE on RW than in previous PT sessions. PT provided min A  and moderate verbal instruction with floor to bed transfer for proper sequencing of movements and improved positioning to allow greater mechanical advantage. Patient responded very well to verbal instruction with floor to bed transfer. Continued skilled PT is recommended to improve LE strength, gait, and balance to allow greater independence within the home and the community.    Pt will benefit from skilled therapeutic intervention in order to improve on the following deficits Abnormal gait;Decreased activity tolerance;Decreased balance;Decreased coordination;Decreased endurance;Decreased knowledge of precautions;Decreased knowledge of use of DME;Decreased mobility;Decreased range of motion;Decreased strength;Difficulty walking;Increased muscle spasms;Impaired perceived functional ability;Impaired flexibility;Impaired sensation;Improper body mechanics;Postural dysfunction;Obesity   Rehab Potential Good   Clinical Impairments Affecting Rehab Potential Positive: good family support, high PLOF. Negative: recent surgery, decreased sensation    PT Frequency 2x / week   PT Duration 12 weeks   PT Treatment/Interventions ADLs/Self Care Home Management;Cryotherapy;Electrical Stimulation;Moist Heat;DME Instruction;Gait training;Stair training;Functional mobility training;Therapeutic activities;Therapeutic exercise;Balance training;Neuromuscular re-education;Patient/family education;Orthotic  Fit/Training;Wheelchair mobility training;Manual techniques;Passive range of motion;Energy conservation   PT Next Visit Plan standing therex. possible balance.    PT Home Exercise Plan continue as given   Consulted and Agree with Plan of Care Patient;Family member/caregiver        Problem List There are no active problems to display for this patient.  Grier Rocher Bridges 07/01/2015   9:00 AM  This entire session was performed under direct supervision and direction of a licensed therapist. I have personally read, edited and approve of the note as written.  Hopkins,Margaret PT, DPT 07/01/2015, 9:00 AM  [facility] MAIN [facility] SERVICES [address] [place], [place], [address] Phone: [phone]   Fax:  [phone]

## 2015-07-05 ENCOUNTER — Ambulatory Visit: Payer: Medicare Other | Admitting: Physical Therapy

## 2015-07-05 ENCOUNTER — Encounter: Payer: Self-pay | Admitting: Physical Therapy

## 2015-07-05 ENCOUNTER — Encounter: Payer: Medicare Other | Admitting: Occupational Therapy

## 2015-07-05 DIAGNOSIS — M545 Low back pain, unspecified: Secondary | ICD-10-CM

## 2015-07-05 DIAGNOSIS — R269 Unspecified abnormalities of gait and mobility: Secondary | ICD-10-CM

## 2015-07-05 DIAGNOSIS — R29898 Other symptoms and signs involving the musculoskeletal system: Secondary | ICD-10-CM

## 2015-07-05 NOTE — Patient Instructions (Addendum)
   Bridge   Lie back, legs bent. Inhale, pressing hips up. Keeping ribs in, lengthen lower back. Exhale, rolling down along spine from top. Repeat _10__ times. Do ___3 sessions per day.  _Copyright  VHI. All rights reserved.  HIP: Flexion / KNEE: Extension, Straight Leg Raise   Raise leg, keeping knee straight. Perform slowly. __8_ reps per set, _2__ sets per day, __5_ days per week   Copyright  VHI. All rights reserved.

## 2015-07-05 NOTE — Therapy (Signed)
[facility] MAIN [facility] SERVICES [address] [place], [place], [address] Phone: [phone]   Fax:  [phone]  Physical Therapy Treatment  Patient Details  Name: Amber Bridges MRN: [id number] Date of Birth: 21-Apr-1941 Referring Provider:  Lubertha South, MD  Encounter Date: 07/05/2015      PT End of Session - 07/05/15 1632    Visit Number 7   Number of Visits 25   Date for PT Re-Evaluation 08/31/15   Authorization Type 7   Authorization Time Period Gcode 10    PT Start Time 1525   PT Stop Time 1624   PT Time Calculation (min) 59 min   Equipment Utilized During Treatment Gait belt   Activity Tolerance Patient tolerated treatment well   Behavior During Therapy [facility] for tasks assessed/performed      Past Medical History  Diagnosis Date  . Depression   . Hypertension   . Bronchitis     Past Surgical History  Procedure Laterality Date  . Spine surgery  1973, 1978, 2016    Most recent Laminectomy to T11 and L3    There were no vitals filed for this visit.  Visit Diagnosis:  Leg weakness, bilateral  Bilateral low back pain without sciatica  Abnormality of gait      Subjective Assessment - 07/05/15 1530    Subjective Patient reports that she is doing well upon arrival to PT. she states that she went shopping today and was able to ambulate for longer distances with less rest breaks today.    Patient is accompained by: Family member   Pertinent History Previous back surgery in 1973 at L5 for disc decompression and then scar tissue removal in 1978. She states that she has had back problems that would present every couple of years and usually get better with rest. Patient also has a history of multiple falls.   Limitations Standing;Walking   How long can you sit comfortably? Able to sit as long as needed    How long can you stand comfortably? able to stand for for up to 6 minutes without holding on    How long can you  walk comfortably? States that she can walk up to 100 ft at home with RW.    Diagnostic tests MRI 04/27/15 revealed severe spinal stenosis at L3-L4 and partial stenosis at T11-L3 and L5-S1. Patient underwent spinal Laminectomies of T11-T12 and L3-L4 on 04/28/15.    Patient Stated Goals Be able to walk with least restrictive device. Wants to be able to drive and go to store and shop for herself   Currently in Pain? No/denies        Treatment :  Nustep level 3, 3 minutes, (unbilled)   Sit to stand from elevated mat table x5 with push from thighs, x 5 with HHA.   Sitting LE therex with red tband  LLE knee extension x10  LLE knee flexion x10  LLE ankle XL/KGM01 each direction LLE ankle IV/EV x10 each direction   Standing therex with red tband bilaterally   Hip flexion x10  Hip extension x10  Hip abduction x10   Supine HEP education:  Bridges 2x10  SLR x5 each LE.   PT provided min verbal instruction to increase ROM, decrease compensation of the trunk and of the hip, as well as improve control of eccentric movement to enhance motion and improve strengthening.   Balance training.  Standing on firm surface: Normal BOS 45 seconds no HHA Narrow BOS  45 seconds no HHA Lunge stance 45 seconds each LE no HHA    Standing on airex: normal BOS 2x30 no HHA  Narrow BOS 2x30 no HHA  Stepping forward and back 4 inches x5 each LE, no HHA   PT provided Min verbal instruction for proper exercise set up, increased use of hip strategy to reduce posterior/anterior LOB and decreased use of UE. Cues also provided for improved weight shift as well as proper sequencing of movement with stepping. PT provided CGA for all balance exercises to increase safety and prevent  LLE knee buckling with stepping.                           PT Education - 07/05/15 1631    Education provided Yes   Education Details Neuromuscular re-education, LE stregthening   Person(s) Educated Patient    Methods Explanation;Demonstration;Verbal cues;Tactile cues   Comprehension Verbalized understanding;Returned demonstration;Verbal cues required;Tactile cues required             PT Long Term Goals - 06/08/15 1530    PT LONG TERM GOAL #1   Title Patient will be independent with HEP to improve strength, balance and gait to increase function with tasks at home by 08/31/15.   Time 12   Period Weeks   Status New   PT LONG TERM GOAL #2   Title Patient will increase bilateral LE strength to at least 4+/5 throughout the LE to allow patient to access stairs in the community with greater ease by 08/31/15.   Time 12   Period Weeks   Status New   PT LONG TERM GOAL #3   Title Patient will score >46 on the Berg balance scale to reduce fall risk by 08/31/15.   Time 12   Period Weeks   Status New   PT LONG TERM GOAL #4   Title Patient will improve gait speed to >1.0 m/s with Least restrictive assistive device to reduce fall risk and increase ease of access to the community by 08/31/15.   Time 12   Period Weeks   Status New   PT LONG TERM GOAL #5   Title Patient will score >40/80 on the LEFS to indicate improved function with daily tasks and decreased impairment by 08/31/15.   Time 12   Period Weeks   Status New               Plan - 07/05/15 1640    Clinical Impression Statement Patient instructed in LE strengthening, and neuromuscular re-education. PT provided CGA for all balance exercises as well as min verbal instruction to utilize hip strategy to improve anterior/posterior control as well as improved weight shifting for stepping exercises. Patient responded well to instruction but demonstrated decreased hip control on the L LE in stance. Verbal and tactile instruction provided for LE strengthening exercises for increased ROM and decreased compensation of the trunk. Patient tolerated increase in resistance for LE strengthening well. Continued skilled PT is recommended to improve  balance, gait, and LE strength to allow increased independence with ADLs.     Pt will benefit from skilled therapeutic intervention in order to improve on the following deficits Abnormal gait;Decreased activity tolerance;Decreased balance;Decreased coordination;Decreased endurance;Decreased knowledge of precautions;Decreased knowledge of use of DME;Decreased mobility;Decreased range of motion;Decreased strength;Difficulty walking;Increased muscle spasms;Impaired perceived functional ability;Impaired flexibility;Impaired sensation;Improper body mechanics;Postural dysfunction;Obesity   Rehab Potential Good   Clinical Impairments Affecting Rehab Potential Positive: good family support, high PLOF. Negative:  recent surgery, decreased sensation    PT Frequency 2x / week   PT Duration 12 weeks   PT Treatment/Interventions ADLs/Self Care Home Management;Cryotherapy;Electrical Stimulation;Moist Heat;DME Instruction;Gait training;Stair training;Functional mobility training;Therapeutic activities;Therapeutic exercise;Balance training;Neuromuscular re-education;Patient/family education;Orthotic Fit/Training;Wheelchair mobility training;Manual techniques;Passive range of motion;Energy conservation   PT Next Visit Plan increased standing balance,    PT Home Exercise Plan see patient instruction   Consulted and Agree with Plan of Care Patient;Family member/caregiver        Problem List There are no active problems to display for this patient.  Grier Rocher SPT 07/06/2015   4:38 PM  This entire session was performed under direct supervision and direction of a licensed therapist . I have personally read, edited and approve of the note as written.  Hopkins,Margaret PT, DPT 07/06/2015, 4:38 PM  [facility] MAIN [facility] SERVICES [address] [place], [place], [address] Phone: [phone]   Fax:  [phone]

## 2015-07-07 ENCOUNTER — Ambulatory Visit: Payer: Medicare Other | Admitting: Physical Therapy

## 2015-07-07 ENCOUNTER — Encounter: Payer: Medicare Other | Admitting: Occupational Therapy

## 2015-07-07 DIAGNOSIS — R29898 Other symptoms and signs involving the musculoskeletal system: Secondary | ICD-10-CM | POA: Diagnosis not present

## 2015-07-07 DIAGNOSIS — R269 Unspecified abnormalities of gait and mobility: Secondary | ICD-10-CM

## 2015-07-07 DIAGNOSIS — M545 Low back pain, unspecified: Secondary | ICD-10-CM

## 2015-07-08 ENCOUNTER — Encounter: Payer: Self-pay | Admitting: Physical Therapy

## 2015-07-08 NOTE — Therapy (Signed)
[facility] MAIN [facility] SERVICES [address] [place], [place], [address] Phone: [phone]   Fax:  [phone]  Physical Therapy Treatment/progress note 06/08/15 - 07/07/15   Patient Details  Name: Amber Bridges MRN: [id number] Date of Birth: 1941-03-29 Referring Provider:  Lubertha South, MD  Encounter Date: 07/07/2015      PT End of Session - 07/08/15 0731    Visit Number 8   Number of Visits 25   Date for PT Re-Evaluation 08/31/15   Authorization Type 1   Authorization Time Period Gcode 10    PT Start Time 1530   PT Stop Time 1615   PT Time Calculation (min) 45 min   Equipment Utilized During Treatment Gait belt   Activity Tolerance Patient tolerated treatment well   Behavior During Therapy [facility] for tasks assessed/performed      Past Medical History  Diagnosis Date  . Depression   . Hypertension   . Bronchitis     Past Surgical History  Procedure Laterality Date  . Spine surgery  1973, 1978, 2016    Most recent Laminectomy to T11 and L3    There were no vitals filed for this visit.  Visit Diagnosis:  Leg weakness, bilateral  Bilateral low back pain without sciatica  Abnormality of gait      Subjective Assessment - 07/07/15 1533    Subjective Patient reports that she is doing well today. She states that she reports that her exercises are going well and she continues to complete them 2 times per day.    Patient is accompained by: Family member   Pertinent History Previous back surgery in 1973 at L5 for disc decompression and then scar tissue removal in 1978. She states that she has had back problems that would present every couple of years and usually get better with rest. Patient also has a history of multiple falls.   Limitations Standing;Walking   How long can you sit comfortably? Able to sit as long as needed    How long can you stand comfortably? able to stand for for up to 6 minutes without holding on     How long can you walk comfortably? States that she can walk up to 100 ft at home with RW.    Diagnostic tests MRI 04/27/15 revealed severe spinal stenosis at L3-L4 and partial stenosis at T11-L3 and L5-S1. Patient underwent spinal Laminectomies of T11-T12 and L3-L4 on 04/28/15.    Patient Stated Goals Be able to walk with least restrictive device. Wants to be able to drive and go to store and shop for herself   Currently in Pain? No/denies            [facility] PT Assessment - 07/08/15 0001    Observation/Other Assessments   Lower Extremity Functional Scale  39/80 (improved from 19/80 on 06/08/15)    Strength   Right Hip Flexion 4+/5   Right Hip Extension 4/5   Right Hip ABduction 4/5   Right Hip ADduction 5/5   Left Hip Flexion 4/5   Left Hip Extension 4/5   Left Hip ABduction 4/5   Left Hip ADduction 5/5   Right Knee Flexion 4+/5   Right Knee Extension 5/5   Left Knee Flexion 4/5   Left Knee Extension 5/5   Right Ankle Dorsiflexion 4+/5   Right Ankle Plantar Flexion 4-/5   Left Ankle Dorsiflexion 4-/5   Left Ankle Plantar Flexion 4-/5   Standardized Balance Assessment   Five  times sit to stand comments  13 seconds with BUE support.    10 Meter Walk Normal gait speed: 0.60m/s with RW. fast gait speed: 0.63m/s with RW     Berg Balance Test   Sit to Stand Able to stand  independently using hands   Standing Unsupported Able to stand safely 2 minutes   Sitting with Back Unsupported but Feet Supported on Floor or Stool Able to sit safely and securely 2 minutes   Stand to Sit Sits safely with minimal use of hands   Transfers Able to transfer safely, definite need of hands   Standing Unsupported with Eyes Closed Able to stand 10 seconds safely   Standing Ubsupported with Feet Together Able to place feet together independently and stand 1 minute safely   From Standing, Reach Forward with Outstretched Arm Can reach forward >12 cm safely (5")   From Standing Position, Pick up Object from  Floor Able to pick up shoe, needs supervision   From Standing Position, Turn to Look Behind Over each Shoulder Turn sideways only but maintains balance   Turn 360 Degrees Needs close supervision or verbal cueing   Standing Unsupported, Alternately Place Feet on Step/Stool Able to complete >2 steps/needs minimal assist   Standing Unsupported, One Foot in Front Able to take small step independently and hold 30 seconds   Standing on One Leg Tries to lift leg/unable to hold 3 seconds but remains standing independently   Total Score 39        Treatment:   Nustep level 4, 3 minutes (unbilled)   PT instructed patient in standardized outcome measures to assess progress including Berg Balance Scale, 10 m walk test, 5 x Sit<>stand, and the LEFS. See above.  PT provided CGA to increase safety as well as min verbal instruction for proper technique with standardized outcome test.   Patient demonstrated sit to stand x2 without the use of UE.  Min verbal instruction for increased momentum and forward weight shift from PT.                      PT Education - 07/08/15 0730    Education provided Yes   Education Details Standardized outcome measures    Person(s) Educated Patient   Methods Explanation;Demonstration;Verbal cues   Comprehension Verbalized understanding;Returned demonstration;Verbal cues required             PT Long Term Goals - 07/07/15 1559    PT LONG TERM GOAL #1   Title Patient will be independent with HEP to improve strength, balance and gait to increase function with tasks at home by 08/31/15.   Time 12   Period Weeks   Status On-going   PT LONG TERM GOAL #2   Title Patient will increase bilateral LE strength to at least 4+/5 throughout the LE to allow patient to access stairs in the community with greater ease by 08/31/15.   Time 12   Period Weeks   Status Partially Met   PT LONG TERM GOAL #3   Title Patient will score >46 on the Berg balance scale  to reduce fall risk by 08/31/15.   Time 12   Period Weeks   Status Partially Met   PT LONG TERM GOAL #4   Title Patient will improve gait speed to >1.0 m/s with Least restrictive assistive device to reduce fall risk and increase ease of access to the community by 08/31/15.   Time 12   Period Weeks  Status Partially Met   PT LONG TERM GOAL #5   Title Patient will score >40/80 on the LEFS to indicate improved function with daily tasks and decreased impairment by 08/31/15.   Time 12   Period Weeks   Status Partially Met   Additional Long Term Goals   Additional Long Term Goals Yes   PT LONG TERM GOAL #6   Title Patient will increase 6 minute walk test by >300 ft to indicate improved function and increased access to the community. by 08/31/15   Time 8   Period Weeks   Status New               Plan - 07-17-15 0731    Clinical Impression Statement Patient instructed in standardized outcome measures to assess progress. Patient demonstrates improves LE strength and power through manual muscle testing as well as a significant improvement in the 5 x sit to stand, which still required UE  support. Balance also improved as evidenced on an improvement of 13 points on the Berg balance scale; improved from high fall risk to significant fall risk (100% to 80%). Function was also seen to improve with a change in normal gait speed from 0/83m/s to 0.5 m/s using RW and an improvement  of approximately 25% on the LEFS. Continued skilled PT is recommended to improve balance, improve, LE strength and enhance gait to allow patient to increase independence with all ADLs.   Pt will benefit from skilled therapeutic intervention in order to improve on the following deficits Abnormal gait;Decreased activity tolerance;Decreased balance;Decreased coordination;Decreased endurance;Decreased knowledge of precautions;Decreased knowledge of use of DME;Decreased mobility;Decreased range of motion;Decreased  strength;Difficulty walking;Increased muscle spasms;Impaired perceived functional ability;Impaired flexibility;Impaired sensation;Improper body mechanics;Postural dysfunction;Obesity   Rehab Potential Good   Clinical Impairments Affecting Rehab Potential Positive: good family support, high PLOF. Negative: recent surgery, decreased sensation    PT Frequency 2x / week   PT Duration 12 weeks   PT Treatment/Interventions ADLs/Self Care Home Management;Cryotherapy;Electrical Stimulation;Moist Heat;DME Instruction;Gait training;Stair training;Functional mobility training;Therapeutic activities;Therapeutic exercise;Balance training;Neuromuscular re-education;Patient/family education;Orthotic Fit/Training;Wheelchair mobility training;Manual techniques;Passive range of motion;Energy conservation   PT Next Visit Plan increased standing balance, increase strengthening.    PT Home Exercise Plan continue as given   Consulted and Agree with Plan of Care Patient;Family member/caregiver          G-Codes - 07/17/15 0739    Functional Assessment Tool Used LEFS, 80m walk, MMT, 5 times sit<>stand, Berg    Functional Limitation Mobility: Walking and moving around   Mobility: Walking and Moving Around Current Status [phone]) At least 40 percent but less than 60 percent impaired, limited or restricted   Mobility: Walking and Moving Around Goal Status [phone]) At least 20 percent but less than 40 percent impaired, limited or restricted      Problem List There are no active problems to display for this patient.  Grier Rocher SPT 07-17-15   8:26 AM  This entire session was performed under direct supervision and direction of a licensed therapist/therapist assistant . I have personally read, edited and approve of the note as written.  Hopkins,Margaret PT, DPT 07-17-15, 8:26 AM  [place] [facility] MAIN [facility] SERVICES [address] [place], [place], [address] Phone: [phone]    Fax:  [phone]

## 2015-07-11 ENCOUNTER — Encounter: Payer: Self-pay | Admitting: Physical Therapy

## 2015-07-11 ENCOUNTER — Ambulatory Visit: Payer: Medicare Other | Attending: Physical Medicine and Rehabilitation | Admitting: Physical Therapy

## 2015-07-11 DIAGNOSIS — M545 Low back pain, unspecified: Secondary | ICD-10-CM

## 2015-07-11 DIAGNOSIS — R269 Unspecified abnormalities of gait and mobility: Secondary | ICD-10-CM | POA: Insufficient documentation

## 2015-07-11 DIAGNOSIS — R29898 Other symptoms and signs involving the musculoskeletal system: Secondary | ICD-10-CM | POA: Insufficient documentation

## 2015-07-12 ENCOUNTER — Ambulatory Visit: Payer: Medicare Other | Admitting: Occupational Therapy

## 2015-07-12 ENCOUNTER — Encounter: Payer: Medicare Other | Admitting: Physical Therapy

## 2015-07-12 NOTE — Therapy (Signed)
[place] [facility] MAIN [facility] SERVICES [address] [place], [place], [address] Phone: [phone]   Fax:  [phone]  Physical Therapy Treatment  Patient Details  Name: Amber Bridges MRN: [id number] Date of Birth: [birth year] Referring Provider:  Lubertha South, MD  Encounter Date: 07/11/2015      PT End of Session - 07/12/15 0738    Visit Number 9   Number of Visits 25   Date for PT Re-Evaluation 08/31/15   Authorization Type 2   Authorization Time Period Gcode 10    PT Start Time 1430   PT Stop Time 1515   PT Time Calculation (min) 45 min   Equipment Utilized During Treatment Gait belt   Activity Tolerance Patient tolerated treatment well   Behavior During Therapy [facility] for tasks assessed/performed      Past Medical History  Diagnosis Date  . Depression   . Hypertension   . Bronchitis     Past Surgical History  Procedure Laterality Date  . Spine surgery  1973, 1978, 2016    Most recent Laminectomy to T11 and L3    There were no vitals filed for this visit.  Visit Diagnosis:  Leg weakness, bilateral  Bilateral low back pain without sciatica  Abnormality of gait      Subjective Assessment - 07/11/15 1437    Subjective Patient states that she is doing well. She reports that she had a good weekend and was able to utilize her walker rather than the wheel chair for several outings in the community .    Patient is accompained by: Family member   Pertinent History Previous back surgery in 1973 at L5 for disc decompression and then scar tissue removal in 1978. She states that she has had back problems that would present every couple of years and usually get better with rest. Patient also has a history of multiple falls.   Limitations Standing;Walking   How long can you sit comfortably? Able to sit as long as needed    How long can you stand comfortably? able to stand for for up to 6 minutes without holding on    How long  can you walk comfortably? States that she can walk up to 100 ft at home with RW.    Diagnostic tests MRI 04/27/15 revealed severe spinal stenosis at L3-L4 and partial stenosis at T11-L3 and L5-S1. Patient underwent spinal Laminectomies of T11-T12 and L3-L4 on 04/28/15.    Patient Stated Goals Be able to walk with least restrictive device. Wants to be able to drive and go to store and shop for herself   Currently in Pain? No/denies       Treatment:    nustep level 4 , 3 minutes (unbilled)    Balance training Sit to stand x10 without UE support Semi tandem stance 2x 30 seconds each LE  Reaching outside BOS to stack cones x 8 BUE  Standing on airex  Narrow BOS 2x 30 seconds  Weight shift normal BOS 2x20  Trunk rotations R and L with ball pass   PT provided CGA min verbal instruction for improve weight shifting over the L LE, increased use of hip strategy to decrease loss of balance, increased use of momentum with sit to stand to improve success, and decreased use of UE.   Gait within therapy gym 27ftx3  Patient used RW. Supervision assist provided by PT with cues for continued movement of RW, improve step length bilaterally, and increased heel strike.  Seated therex with red tband  BLE Hip flexion x12  BLE Hip abduction x12 BLE Hip extension x12 BLE Knee extension x12  BLE ankle PF/DF x10 each direction  BLE ankle IV/EV x10 each direction   PT Provided moderate verbal instruction for improve eccentric control as well as improve ROM. Patient was noted to have increased ROM and on the LLE with all knee and ankle exercises compared to previous PT treatment sessions.                        PT Education - 07/12/15 0737    Education provided Yes   Education Details Balance, gait , LE strengthenig    Person(s) Educated Patient   Methods Explanation;Demonstration;Verbal cues   Comprehension Verbalized understanding;Returned demonstration;Verbal cues required              PT Long Term Goals - 07/07/15 1559    PT LONG TERM GOAL #1   Title Patient will be independent with HEP to improve strength, balance and gait to increase function with tasks at home by 08/31/15.   Time 12   Period Weeks   Status On-going   PT LONG TERM GOAL #2   Title Patient will increase bilateral LE strength to at least 4+/5 throughout the LE to allow patient to access stairs in the community with greater ease by 08/31/15.   Time 12   Period Weeks   Status Partially Met   PT LONG TERM GOAL #3   Title Patient will score >46 on the Berg balance scale to reduce fall risk by 08/31/15.   Time 12   Period Weeks   Status Partially Met   PT LONG TERM GOAL #4   Title Patient will improve gait speed to >1.0 m/s with Least restrictive assistive device to reduce fall risk and increase ease of access to the community by 08/31/15.   Time 12   Period Weeks   Status Partially Met   PT LONG TERM GOAL #5   Title Patient will score >40/80 on the LEFS to indicate improved function with daily tasks and decreased impairment by 08/31/15.   Time 12   Period Weeks   Status Partially Met   Additional Long Term Goals   Additional Long Term Goals Yes   PT LONG TERM GOAL #6   Title Patient will increase 6 minute walk test by >300 ft to indicate improved function and increased access to the community. by 08/31/15   Time 8   Period Weeks   Status New               Plan - 07/12/15 0272    Clinical Impression Statement Patient instructed in LE strengthening, gait training and Balance training on this day. PT decreased assistance with gait with RW to supervision assist, with min verbal instruction for improved heel strike, step length, and decreased use of UE to allow continual movement of RW. Min verbal instruction was also provided with LE strengthening and balance training to increase weight shifting, improve use of hip strategy, as well as increase ROM with therex. Patient was  noted to have increased ROM and eccentric control of the L LE for hip and ankle exercises compared to previous PT treatment sessions. Continued PT is recommended to improve LE strength, balance, and gait to increase independence with all ADLs.   Pt will benefit from skilled therapeutic intervention in order to improve on the following deficits Abnormal gait;Decreased activity tolerance;Decreased balance;Decreased  coordination;Decreased endurance;Decreased knowledge of precautions;Decreased knowledge of use of DME;Decreased mobility;Decreased range of motion;Decreased strength;Difficulty walking;Increased muscle spasms;Impaired perceived functional ability;Impaired flexibility;Impaired sensation;Improper body mechanics;Postural dysfunction;Obesity   Rehab Potential Good   Clinical Impairments Affecting Rehab Potential Positive: good family support, high PLOF. Negative: recent surgery, decreased sensation    PT Frequency 2x / week   PT Duration 12 weeks   PT Treatment/Interventions ADLs/Self Care Home Management;Cryotherapy;Electrical Stimulation;Moist Heat;DME Instruction;Gait training;Stair training;Functional mobility training;Therapeutic activities;Therapeutic exercise;Balance training;Neuromuscular re-education;Patient/family education;Orthotic Fit/Training;Wheelchair mobility training;Manual techniques;Passive range of motion;Energy conservation   PT Next Visit Plan increased standing balance, increase standing strengthening.    PT Home Exercise Plan continue as given   Consulted and Agree with Plan of Care Patient;Family member/caregiver        Problem List There are no active problems to display for this patient.  Grier Rocher SPT 07/12/2015   9:19 AM  This entire session was performed under direct supervision and direction of a licensed therapist . I have personally read, edited and approve of the note as written.   Hopkins,Margaret PT, DPT 07/12/2015, 9:19 AM  [place] [facility] MAIN [facility] SERVICES [address] [place], [place], [address] Phone: [phone]   Fax:  [phone]

## 2015-07-13 ENCOUNTER — Encounter: Payer: Self-pay | Admitting: Physical Therapy

## 2015-07-13 ENCOUNTER — Ambulatory Visit: Payer: Medicare Other | Admitting: Physical Therapy

## 2015-07-13 DIAGNOSIS — R29898 Other symptoms and signs involving the musculoskeletal system: Secondary | ICD-10-CM

## 2015-07-13 DIAGNOSIS — M545 Low back pain, unspecified: Secondary | ICD-10-CM

## 2015-07-13 DIAGNOSIS — R269 Unspecified abnormalities of gait and mobility: Secondary | ICD-10-CM

## 2015-07-14 ENCOUNTER — Encounter: Payer: Medicare Other | Admitting: Occupational Therapy

## 2015-07-14 ENCOUNTER — Encounter: Payer: Medicare Other | Admitting: Physical Therapy

## 2015-07-14 NOTE — Therapy (Signed)
[facility] MAIN [facility] SERVICES [address] [place], [place], [address] Phone: [phone]   Fax:  [phone]  Physical Therapy Treatment  Patient Details  Name: Amber Bridges MRN: [id number] Date of Birth: 05/30/1941 Referring Provider:  Lubertha South, MD  Encounter Date: 07/13/2015      PT End of Session - 07/13/15 1504    Visit Number 10   Number of Visits 25   Date for PT Re-Evaluation 08/31/15   Authorization Type 3   Authorization Time Period Gcode 10    PT Start Time 1445   PT Stop Time 1542   PT Time Calculation (min) 57 min   Equipment Utilized During Treatment Gait belt   Activity Tolerance Patient tolerated treatment well   Behavior During Therapy [facility] for tasks assessed/performed      Past Medical History  Diagnosis Date  . Depression   . Hypertension   . Bronchitis     Past Surgical History  Procedure Laterality Date  . Spine surgery  1973, 1978, 2016    Most recent Laminectomy to T11 and L3    There were no vitals filed for this visit.  Visit Diagnosis:  Leg weakness, bilateral  Bilateral low back pain without sciatica  Abnormality of gait      Subjective Assessment - 07/13/15 1748    Subjective Patient states that she is doing well upon arrival to PT. She states that she has a follow up appointment with "inpatient rehab doctor" on 10/11.    Patient is accompained by: Family member   Pertinent History Previous back surgery in 1973 at L5 for disc decompression and then scar tissue removal in 1978. She states that she has had back problems that would present every couple of years and usually get better with rest. Patient also has a history of multiple falls.   Limitations Standing;Walking   How long can you sit comfortably? Able to sit as long as needed    How long can you stand comfortably? able to stand for for up to 6 minutes without holding on    How long can you walk comfortably? States  that she can walk up to 100 ft at home with RW.    Diagnostic tests MRI 04/27/15 revealed severe spinal stenosis at L3-L4 and partial stenosis at T11-L3 and L5-S1. Patient underwent spinal Laminectomies of T11-T12 and L3-L4 on 04/28/15.    Patient Stated Goals Be able to walk with least restrictive device. Wants to be able to drive and go to store and shop for herself   Currently in Pain? No/denies        treatment   nustep level 4, 4 minutes. (Unbilled)   Forward step-ups to 6 inch step 2x 10  Each LE  Lateral step-ups to 5 inch steps 2x 10 each LE  Sit to stand 2x10    Patient required increased time and effort with step up exercises due to LE fatigue. Patient was able to complete all sit to stands without the use of UE with slight cueing to increase forward weight shift.   Standing on airex: Narrow BOS 2x45 seconds Weight shift normal BOS 2x 20 each direction  Lunge stance 3x 45 seconds each LE   Weight shift in lunge stance 3x 20 each LE  Rocker board 2 x 1 minute for AP control   PT provided CGA for all exercises. Min-Moderate verbal instruction for improve weight bearing over the L LE, proper use of UE  for step ups, and increase Knee control bilaterally L>R. PT also provided min verbal instruction for increased use of hip strategy to correct posterior and lateral LOB with standing on airex as well as decreased use of UE support. Patient continues to display improve balance and increased hip and ankle control with each PT session.                           PT Education - 07/13/15 1757    Education provided Yes   Education Details balance, LE strengthening   Person(s) Educated Patient   Methods Explanation;Demonstration;Verbal cues   Comprehension Verbalized understanding;Returned demonstration;Verbal cues required             PT Long Term Goals - 07/07/15 1559    PT LONG TERM GOAL #1   Title Patient will be independent with HEP to improve strength,  balance and gait to increase function with tasks at home by 08/31/15.   Time 12   Period Weeks   Status On-going   PT LONG TERM GOAL #2   Title Patient will increase bilateral LE strength to at least 4+/5 throughout the LE to allow patient to access stairs in the community with greater ease by 08/31/15.   Time 12   Period Weeks   Status Partially Met   PT LONG TERM GOAL #3   Title Patient will score >46 on the Berg balance scale to reduce fall risk by 08/31/15.   Time 12   Period Weeks   Status Partially Met   PT LONG TERM GOAL #4   Title Patient will improve gait speed to >1.0 m/s with Least restrictive assistive device to reduce fall risk and increase ease of access to the community by 08/31/15.   Time 12   Period Weeks   Status Partially Met   PT LONG TERM GOAL #5   Title Patient will score >40/80 on the LEFS to indicate improved function with daily tasks and decreased impairment by 08/31/15.   Time 12   Period Weeks   Status Partially Met   Additional Long Term Goals   Additional Long Term Goals Yes   PT LONG TERM GOAL #6   Title Patient will increase 6 minute walk test by >300 ft to indicate improved function and increased access to the community. by 08/31/15   Time 8   Period Weeks   Status New               Plan - 07/13/15 1758    Clinical Impression Statement Patient instructed in LE strengthening and balance training. Patient provided CGA with all standing therex and neuromuscular re-ed to improve patient safety. Patient required moderate verbal instruction with therex for improve hip and knee stability and improved posture. Min verbal instruction with balance training for improved weight shift and increased use of ankle strategy to prevent posterior and anterior LOB. Patient demonstrated good response to instruction, as well as improved control forward lean and weight shifting tasks on this day. Continued skilled PT is recommended to improve gait, increase  strength, and improve balance to reduce fall risk and allow patient to return to PLOF.   Pt will benefit from skilled therapeutic intervention in order to improve on the following deficits Abnormal gait;Decreased activity tolerance;Decreased balance;Decreased coordination;Decreased endurance;Decreased knowledge of precautions;Decreased knowledge of use of DME;Decreased mobility;Decreased range of motion;Decreased strength;Difficulty walking;Increased muscle spasms;Impaired perceived functional ability;Impaired flexibility;Impaired sensation;Improper body mechanics;Postural dysfunction;Obesity   Rehab Potential Good  Clinical Impairments Affecting Rehab Potential Positive: good family support, high PLOF. Negative: recent surgery, decreased sensation    PT Frequency 2x / week   PT Duration 12 weeks   PT Treatment/Interventions ADLs/Self Care Home Management;Cryotherapy;Electrical Stimulation;Moist Heat;DME Instruction;Gait training;Stair training;Functional mobility training;Therapeutic activities;Therapeutic exercise;Balance training;Neuromuscular re-education;Patient/family education;Orthotic Fit/Training;Wheelchair mobility training;Manual techniques;Passive range of motion;Energy conservation   PT Next Visit Plan increased standing balance, increase standing strengthening.    PT Home Exercise Plan continue as given   Consulted and Agree with Plan of Care Patient;Family member/caregiver        Problem List There are no active problems to display for this patient.  Grier Rocher SPT 07/14/2015   11:44 AM  This entire session was performed under direct supervision and direction of a licensed therapist. I have personally read, edited and approve of the note as written.  Hopkins,Margaret PT, DPT 07/14/2015, 11:44 AM  [facility] MAIN [facility] SERVICES [address] [place], [place], [address] Phone: [phone]   Fax:  [phone]

## 2015-07-19 ENCOUNTER — Ambulatory Visit: Payer: Medicare Other | Admitting: Physical Therapy

## 2015-07-19 ENCOUNTER — Encounter: Payer: Medicare Other | Admitting: Physical Therapy

## 2015-07-19 ENCOUNTER — Encounter: Payer: Medicare Other | Admitting: Occupational Therapy

## 2015-07-19 ENCOUNTER — Encounter: Payer: Self-pay | Admitting: Physical Therapy

## 2015-07-19 DIAGNOSIS — M545 Low back pain, unspecified: Secondary | ICD-10-CM

## 2015-07-19 DIAGNOSIS — R29898 Other symptoms and signs involving the musculoskeletal system: Secondary | ICD-10-CM

## 2015-07-19 DIAGNOSIS — R269 Unspecified abnormalities of gait and mobility: Secondary | ICD-10-CM

## 2015-07-19 NOTE — Therapy (Signed)
[facility] MAIN [facility] SERVICES [address] [place], [place], [address] Phone: [phone]   Fax:  [phone]  Physical Therapy Treatment  Patient Details  Name: Amber Bridges MRN: [id number] Date of Birth: Feb 09, 1941 Referring Provider:  Lubertha South, MD  Encounter Date: 07/19/2015      PT End of Session - 07/19/15 1426    Visit Number 11   Number of Visits 25   Date for PT Re-Evaluation 08/31/15   Authorization Type 4   Authorization Time Period Gcode 10    PT Start Time 1330   PT Stop Time 1417   PT Time Calculation (min) 47 min   Equipment Utilized During Treatment Gait belt   Activity Tolerance Patient tolerated treatment well   Behavior During Therapy [facility] for tasks assessed/performed      Past Medical History  Diagnosis Date  . Depression   . Hypertension   . Bronchitis     Past Surgical History  Procedure Laterality Date  . Spine surgery  1973, 1978, 2016    Most recent Laminectomy to T11 and L3    There were no vitals filed for this visit.  Visit Diagnosis:  Leg weakness, bilateral  Bilateral low back pain without sciatica  Abnormality of gait      Subjective Assessment - 07/19/15 1335    Subjective Patient reports that she is doing well. She was able to walk into PT with RW from the parking lot today for the first time. She also reports that she went to [facility] for a follow up the her doctor [facility], and states that they were impressed with her progress.    Patient is accompained by: Family member   Pertinent History Previous back surgery in 1973 at L5 for disc decompression and then scar tissue removal in 1978. She states that she has had back problems that would present every couple of years and usually get better with rest. Patient also has a history of multiple falls.   Limitations Standing;Walking   How long can you sit comfortably? Able to sit as long as needed    How long  can you stand comfortably? able to stand for for up to 6 minutes without holding on    How long can you walk comfortably? States that she can walk up to 100 ft at home with RW.    Diagnostic tests MRI 04/27/15 revealed severe spinal stenosis at L3-L4 and partial stenosis at T11-L3 and L5-S1. Patient underwent spinal Laminectomies of T11-T12 and L3-L4 on 04/28/15.    Patient Stated Goals Be able to walk with least restrictive device. Wants to be able to drive and go to store and shop for herself   Currently in Pain? No/denies          Nustep level 3, 3 minutes ( unbilled)   Therex:  Sit to stand x 10 with 4# ball  Hip abduction with red tband x 12 BLE  Hip flexion with red tband x 12 BLE   BAPs board level 2  ankle AROM PF/ DF x 15 each LE  Ankle AROM EV/IV x 15 each LE   PT provided verbal And tactile instruction to increase ROM, decrease compensation of trunk movements, limit accessory movement of the hip with ankle motions, and proper weight shift for sit to stand. Patient responded well to instruction but demonstrated difficulty with eversion on BAPs board bilaterally.   Balance training:   Toe taps on 6  inch step x 10 BLE no UE support  Tandem stance 2 x 30 seconds Each LE no UE support   Standing on airex : Narrow BOS 2x 30 seconds  Ball pass L and R with contralateral reach x 12 each direction  Weight shift forward and back 2 x20 seconds  Weight shift L and R 2 x 20 seconds   Patient required min verbal instruction for increased weight shift over the L LE, improve activation of L hip abductors in stance, improved use of hip strategy to prevent lateral and posterior LOB. Patient responded well to instruction and was able to maintain tandem stance for > 30 seconds bilaterally.                         PT Education - 07/19/15 1426    Education provided Yes   Education Details LE strengthening. Balance training.    Person(s) Educated Patient   Methods  Explanation;Demonstration;Verbal cues   Comprehension Verbalized understanding;Returned demonstration;Verbal cues required             PT Long Term Goals - 07/07/15 1559    PT LONG TERM GOAL #1   Title Patient will be independent with HEP to improve strength, balance and gait to increase function with tasks at home by 08/31/15.   Time 12   Period Weeks   Status On-going   PT LONG TERM GOAL #2   Title Patient will increase bilateral LE strength to at least 4+/5 throughout the LE to allow patient to access stairs in the community with greater ease by 08/31/15.   Time 12   Period Weeks   Status Partially Met   PT LONG TERM GOAL #3   Title Patient will score >46 on the Berg balance scale to reduce fall risk by 08/31/15.   Time 12   Period Weeks   Status Partially Met   PT LONG TERM GOAL #4   Title Patient will improve gait speed to >1.0 m/s with Least restrictive assistive device to reduce fall risk and increase ease of access to the community by 08/31/15.   Time 12   Period Weeks   Status Partially Met   PT LONG TERM GOAL #5   Title Patient will score >40/80 on the LEFS to indicate improved function with daily tasks and decreased impairment by 08/31/15.   Time 12   Period Weeks   Status Partially Met   Additional Long Term Goals   Additional Long Term Goals Yes   PT LONG TERM GOAL #6   Title Patient will increase 6 minute walk test by >300 ft to indicate improved function and increased access to the community. by 08/31/15   Time 8   Period Weeks   Status New               Plan - 07/19/15 1427    Clinical Impression Statement PT instructed patient in LE strengthening as well balance training. PT provided CGA for all balance training exercises as well as cues for improved use of weight shift and hip strategy to prevent posterior and lateral LOB. Min instruction also provided with therex to improve ROM and decrease compensation of the hip and trunk. Patient responded  well to all verbal instruction and demonstrated the ability to maintain tandem stance for >30 seconds bilaterally and improved activation of L hip abductors in closed chain. Continued skilled PT is recommended to improve LE strength, balance, and gait to allow patient  greater independence with ADLs.   Pt will benefit from skilled therapeutic intervention in order to improve on the following deficits Abnormal gait;Decreased activity tolerance;Decreased balance;Decreased coordination;Decreased endurance;Decreased knowledge of precautions;Decreased knowledge of use of DME;Decreased mobility;Decreased range of motion;Decreased strength;Difficulty walking;Increased muscle spasms;Impaired perceived functional ability;Impaired flexibility;Impaired sensation;Improper body mechanics;Postural dysfunction;Obesity   Rehab Potential Good   Clinical Impairments Affecting Rehab Potential Positive: good family support, high PLOF. Negative: recent surgery, decreased sensation    PT Frequency 2x / week   PT Duration 12 weeks   PT Treatment/Interventions ADLs/Self Care Home Management;Cryotherapy;Electrical Stimulation;Moist Heat;DME Instruction;Gait training;Stair training;Functional mobility training;Therapeutic activities;Therapeutic exercise;Balance training;Neuromuscular re-education;Patient/family education;Orthotic Fit/Training;Wheelchair mobility training;Manual techniques;Passive range of motion;Energy conservation   PT Next Visit Plan closed chain strengthening. Gait training   PT Home Exercise Plan continue as given   Consulted and Agree with Plan of Care Patient;Family member/caregiver        Problem List There are no active problems to display for this patient.  Grier Rocher SPT 07/20/2015   8:41 AM  This entire session was performed under direct supervision and direction of a licensed therapist . I have personally read, edited and approve of the note as written.  Hopkins,Margaret PT,  DPT 07/20/2015, 8:41 AM  [facility] MAIN [facility] SERVICES [address] [place], [place], [address] Phone: [phone]   Fax:  [phone]

## 2015-07-21 ENCOUNTER — Ambulatory Visit: Payer: Medicare Other | Admitting: Physical Therapy

## 2015-07-21 ENCOUNTER — Encounter: Payer: Medicare Other | Admitting: Physical Therapy

## 2015-07-21 ENCOUNTER — Encounter: Payer: Medicare Other | Admitting: Occupational Therapy

## 2015-07-21 DIAGNOSIS — M545 Low back pain, unspecified: Secondary | ICD-10-CM

## 2015-07-21 DIAGNOSIS — R29898 Other symptoms and signs involving the musculoskeletal system: Secondary | ICD-10-CM

## 2015-07-21 DIAGNOSIS — R269 Unspecified abnormalities of gait and mobility: Secondary | ICD-10-CM

## 2015-07-21 NOTE — Therapy (Signed)
[facility] MAIN [facility] SERVICES [address] [place], [place], [address] Phone: [phone]   Fax:  [phone]  Physical Therapy Treatment  Patient Details  Name: Kaede Pannier MRN: [id number] Date of Birth: 1941-02-28 Referring Provider:  Lubertha South, MD  Encounter Date: 07/21/2015      PT End of Session - 07/21/15 1308    Visit Number 12   Number of Visits 25   Date for PT Re-Evaluation 08/31/15   Authorization Type 5   Authorization Time Period Gcode 10    PT Start Time 1300   PT Stop Time 1345   PT Time Calculation (min) 45 min   Equipment Utilized During Treatment Gait belt   Activity Tolerance Patient tolerated treatment well   Behavior During Therapy [facility] for tasks assessed/performed      Past Medical History  Diagnosis Date  . Depression   . Hypertension   . Bronchitis     Past Surgical History  Procedure Laterality Date  . Spine surgery  1973, 1978, 2016    Most recent Laminectomy to T11 and L3    There were no vitals filed for this visit.  Visit Diagnosis:  Leg weakness, bilateral  Bilateral low back pain without sciatica  Abnormality of gait      Subjective Assessment - 07/21/15 1305    Subjective Patient reports that she is doing well. She states that her home exercises are going well and that she seems to be getting more motion in the ankle.    Patient is accompained by: Family member   Pertinent History Previous back surgery in 1973 at L5 for disc decompression and then scar tissue removal in 1978. She states that she has had back problems that would present every couple of years and usually get better with rest. Patient also has a history of multiple falls.   Limitations Standing;Walking   How long can you sit comfortably? Able to sit as long as needed    How long can you stand comfortably? able to stand for for up to 6 minutes without holding on    How long can you walk comfortably? States  that she can walk up to 100 ft at home with RW.    Diagnostic tests MRI 04/27/15 revealed severe spinal stenosis at L3-L4 and partial stenosis at T11-L3 and L5-S1. Patient underwent spinal Laminectomies of T11-T12 and L3-L4 on 04/28/15.    Patient Stated Goals Be able to walk with least restrictive device. Wants to be able to drive and go to store and shop for herself   Currently in Pain? No/denies          Nustep level 5, 4 minutes ( unbilled)   Leg press on the quantum press 90#, 2x12   Lunge press on blue foam x 12 BLE  Posterior chain squat on blue foam x 12 BLE  Standing calf stretch 2x 20 seconds BLE  Attempted Standing calf raise in // bars (patient unable to perform)  Seated ankle PF  with green tband BLE 2x12  seated DF with red tband BLE 2x 12  Seated knee Extension green tband 2x12 BLE    Gait training in therapy gym  90 ft with RW and Bilat AFO.  180 ft with RW no AFOs.  63ft Large based quad cane, L LE AFO    PT provided CGA for all gait training and standing therex to improve patient safety.  Moderate verbal instruction provided with therex for proper  exercise technique, improved speed of movement, increased ROM, decreased compensation of the trunk to improve knee control and improve knee control. Patient responded well to instruction.  PT provided Moderate verbal instruction for gait training including cues to increase heel strike on the L LE as well as improve step length with RW. Moderate instruction provided with education for 3 point gait pattern using large based quad cane. Patient demonstrated good 3 point gait pattern technique following instruction. Patient also demonstrated adequate heel strike with the R LE with gait training without AFO; no knee instability noted with gait without AFO.                          PT Education - 07/21/15 1307    Education provided Yes   Education Details gait training, LE strengthening.    Person(s) Educated  Patient   Methods Explanation;Demonstration;Verbal cues   Comprehension Verbalized understanding;Returned demonstration;Verbal cues required             PT Long Term Goals - 07/07/15 1559    PT LONG TERM GOAL #1   Title Patient will be independent with HEP to improve strength, balance and gait to increase function with tasks at home by 08/31/15.   Time 12   Period Weeks   Status On-going   PT LONG TERM GOAL #2   Title Patient will increase bilateral LE strength to at least 4+/5 throughout the LE to allow patient to access stairs in the community with greater ease by 08/31/15.   Time 12   Period Weeks   Status Partially Met   PT LONG TERM GOAL #3   Title Patient will score >46 on the Berg balance scale to reduce fall risk by 08/31/15.   Time 12   Period Weeks   Status Partially Met   PT LONG TERM GOAL #4   Title Patient will improve gait speed to >1.0 m/s with Least restrictive assistive device to reduce fall risk and increase ease of access to the community by 08/31/15.   Time 12   Period Weeks   Status Partially Met   PT LONG TERM GOAL #5   Title Patient will score >40/80 on the LEFS to indicate improved function with daily tasks and decreased impairment by 08/31/15.   Time 12   Period Weeks   Status Partially Met   Additional Long Term Goals   Additional Long Term Goals Yes   PT LONG TERM GOAL #6   Title Patient will increase 6 minute walk test by >300 ft to indicate improved function and increased access to the community. by 08/31/15   Time 8   Period Weeks   Status New               Plan - 07/21/15 1408    Clinical Impression Statement PT instructed patient in gait training as well as LE strengthening. PT provided CGA for all standing therex and gait training. PT increased difficulty with therex on this day, patient responded will to increased difficulty, needing only 1 seated rest break. Patient instructed in gait training with large based quad cane,  without AFO on the R LE for the first time today. PT required moderate verbal instruction for 3 point gait pattern. Patient responded very well to instruction. Continued skilled PT is recommended to improve gait, balance and strength to increase independence with ADLs.   Pt will benefit from skilled therapeutic intervention in order to improve on the following  deficits Abnormal gait;Decreased activity tolerance;Decreased balance;Decreased coordination;Decreased endurance;Decreased knowledge of precautions;Decreased knowledge of use of DME;Decreased mobility;Decreased range of motion;Decreased strength;Difficulty walking;Increased muscle spasms;Impaired perceived functional ability;Impaired flexibility;Impaired sensation;Improper body mechanics;Postural dysfunction;Obesity   Rehab Potential Good   Clinical Impairments Affecting Rehab Potential Positive: good family support, high PLOF. Negative: recent surgery, decreased sensation    PT Frequency 2x / week   PT Duration 12 weeks   PT Treatment/Interventions ADLs/Self Care Home Management;Cryotherapy;Electrical Stimulation;Moist Heat;DME Instruction;Gait training;Stair training;Functional mobility training;Therapeutic activities;Therapeutic exercise;Balance training;Neuromuscular re-education;Patient/family education;Orthotic Fit/Training;Wheelchair mobility training;Manual techniques;Passive range of motion;Energy conservation   PT Next Visit Plan increased Gait training   PT Home Exercise Plan continue as given   Consulted and Agree with Plan of Care Patient;Family member/caregiver        Problem List There are no active problems to display for this patient.  Grier Rocher SPT 07/21/2015   4:28 PM   This entire session was performed under direct supervision and direction of a licensed therapist/therapist assistant . I have personally read, edited and approve of the note as written.  Kerin Ransom, PT, DPT    07/21/2015, 4:28 PM  [facility] MAIN [facility] SERVICES [address] [place], [place], [address] Phone: [phone]   Fax:  [phone]

## 2015-07-26 ENCOUNTER — Encounter: Payer: Medicare Other | Admitting: Occupational Therapy

## 2015-07-26 ENCOUNTER — Ambulatory Visit: Payer: Medicare Other | Admitting: Physical Therapy

## 2015-07-26 ENCOUNTER — Ambulatory Visit: Payer: Medicare Other | Admitting: Occupational Therapy

## 2015-07-26 DIAGNOSIS — R29898 Other symptoms and signs involving the musculoskeletal system: Secondary | ICD-10-CM | POA: Diagnosis not present

## 2015-07-26 DIAGNOSIS — M545 Low back pain, unspecified: Secondary | ICD-10-CM

## 2015-07-26 DIAGNOSIS — R269 Unspecified abnormalities of gait and mobility: Secondary | ICD-10-CM

## 2015-07-26 NOTE — Patient Instructions (Addendum)
   All exercises provided were adapted from [employer]. Patient was provided a written handout with pictures as described. Any additional cues were manually entered in to handout and copied in to this document.  TANDEM STANCE WITH SUPPORT  Stand in front of a chair, table or counter top for support. Then place the heel of one foot so that it is touching the toes of the other foot. Maintain your balance in this position.   Hip Abduction with Band  Start in slight squat position with feet pointing straight forward.  Step sideways, slowly bringing feet away from each other.   Slowly return to start position.  Take 10 steps each direction.    SIT TO STAND - NO HANDS  Start by sitting in a chair. Next, raise up to standing without using your hands for support.    ** As fast as you can

## 2015-07-27 NOTE — Therapy (Signed)
[place] MAIN [facility] SERVICES [address] [place], [place], [address] Phone: [phone]   Fax:  [phone]  Physical Therapy Treatment  Patient Details  Name: Amber Bridges MRN: [id number] Date of Birth: 08-13-1941 No Data Recorded  Encounter Date: 07/26/2015      PT End of Session - 07/26/15 1551    Visit Number 13   Number of Visits 25   Date for PT Re-Evaluation 08/31/15   Authorization Type 6   Authorization Time Period Gcode 10    PT Start Time 1342   PT Stop Time 1429   PT Time Calculation (min) 47 min   Equipment Utilized During Treatment Gait belt   Activity Tolerance Patient tolerated treatment well   Behavior During Therapy WFL for tasks assessed/performed      Past Medical History  Diagnosis Date  . Depression   . Hypertension   . Bronchitis     Past Surgical History  Procedure Laterality Date  . Spine surgery  1973, 1978, 2016    Most recent Laminectomy to T11 and L3    There were no vitals filed for this visit.  Visit Diagnosis:  Leg weakness, bilateral  Bilateral low back pain without sciatica  Abnormality of gait      Subjective Assessment - 07/26/15 1350    Subjective Patient reports no new falls since her last visit, she continues to perform her HEP 2x per day. She is walking more and more (1400 steps yesterday).    Patient is accompained by: Family member   Pertinent History Previous back surgery in 1973 at L5 for disc decompression and then scar tissue removal in 1978. She states that she has had back problems that would present every couple of years and usually get better with rest. Patient also has a history of multiple falls.   Limitations Standing;Walking   How long can you sit comfortably? Able to sit as long as needed    How long can you stand comfortably? able to stand for for up to 6 minutes without holding on    How long can you walk comfortably? States that she can walk up to 100  ft at home with RW.    Diagnostic tests MRI 04/27/15 revealed severe spinal stenosis at L3-L4 and partial stenosis at T11-L3 and L5-S1. Patient underwent spinal Laminectomies of T11-T12 and L3-L4 on 04/28/15.    Patient Stated Goals Be able to walk with least restrictive device. Wants to be able to drive and go to store and shop for herself   Currently in Pain? No/denies        Standing heel raises x 15  Standing hip abductions x 10 bilaterally   Standing hip flexion marching x 10 bilaterally   Lateral and retro gait with minimal to no HHA, patient appears somewhat ataxic with each in // bars. She demonstrates decreased confidence and SLS time currently.   Tandem stance, progressed to tandem gait with HHA, she is able to hold modified tandem stance which she can hold for 20 seconds. Tandem stance   Agility ladder forward and laterally with 2 feet in each step. Again ataxic with movement secondary to decreased ability to control her torso in SLS.   Lateral step ups x 10 bilaterally with 2 HHA x 1 set, 2nd set with 1 HHA   Lateral band walks with red t-band x 5 repetitions for 2 rounds bilaterally for 2 sets (noted decreased RLE elevation with lateral step, provided yellow  [place] MAIN [facility] SERVICES [address] [place], [place], [address] Phone: [phone]   Fax:  [phone]  Physical Therapy Treatment  Patient Details  Name: Amber Bridges MRN: [id number] Date of Birth: 08-13-1941 No Data Recorded  Encounter Date: 07/26/2015      PT End of Session - 07/26/15 1551    Visit Number 13   Number of Visits 25   Date for PT Re-Evaluation 08/31/15   Authorization Type 6   Authorization Time Period Gcode 10    PT Start Time 1342   PT Stop Time 1429   PT Time Calculation (min) 47 min   Equipment Utilized During Treatment Gait belt   Activity Tolerance Patient tolerated treatment well   Behavior During Therapy WFL for tasks assessed/performed      Past Medical History  Diagnosis Date  . Depression   . Hypertension   . Bronchitis     Past Surgical History  Procedure Laterality Date  . Spine surgery  1973, 1978, 2016    Most recent Laminectomy to T11 and L3    There were no vitals filed for this visit.  Visit Diagnosis:  Leg weakness, bilateral  Bilateral low back pain without sciatica  Abnormality of gait      Subjective Assessment - 07/26/15 1350    Subjective Patient reports no new falls since her last visit, she continues to perform her HEP 2x per day. She is walking more and more (1400 steps yesterday).    Patient is accompained by: Family member   Pertinent History Previous back surgery in 1973 at L5 for disc decompression and then scar tissue removal in 1978. She states that she has had back problems that would present every couple of years and usually get better with rest. Patient also has a history of multiple falls.   Limitations Standing;Walking   How long can you sit comfortably? Able to sit as long as needed    How long can you stand comfortably? able to stand for for up to 6 minutes without holding on    How long can you walk comfortably? States that she can walk up to 100  ft at home with RW.    Diagnostic tests MRI 04/27/15 revealed severe spinal stenosis at L3-L4 and partial stenosis at T11-L3 and L5-S1. Patient underwent spinal Laminectomies of T11-T12 and L3-L4 on 04/28/15.    Patient Stated Goals Be able to walk with least restrictive device. Wants to be able to drive and go to store and shop for herself   Currently in Pain? No/denies        Standing heel raises x 15  Standing hip abductions x 10 bilaterally   Standing hip flexion marching x 10 bilaterally   Lateral and retro gait with minimal to no HHA, patient appears somewhat ataxic with each in // bars. She demonstrates decreased confidence and SLS time currently.   Tandem stance, progressed to tandem gait with HHA, she is able to hold modified tandem stance which she can hold for 20 seconds. Tandem stance   Agility ladder forward and laterally with 2 feet in each step. Again ataxic with movement secondary to decreased ability to control her torso in SLS.   Lateral step ups x 10 bilaterally with 2 HHA x 1 set, 2nd set with 1 HHA   Lateral band walks with red t-band x 5 repetitions for 2 rounds bilaterally for 2 sets (noted decreased RLE elevation with lateral step, provided yellow  PT Home Exercise Plan See patient instructions    Recommended Other Services Seeing OT   Consulted and Agree with Plan of Care Patient        Problem List There are no active problems to display for this patient.  Kerman Passey, PT, DPT    07/27/2015, 8:30 AM  [place] MAIN [facility] SERVICES [address] [place], [place], [address] Phone: [phone]   Fax:  [phone]  Name: Amber Bridges MRN: [id number] Date of Birth: 08-24-1941

## 2015-07-28 ENCOUNTER — Encounter: Payer: Medicare Other | Admitting: Occupational Therapy

## 2015-07-28 ENCOUNTER — Ambulatory Visit: Payer: Medicare Other | Admitting: Physical Therapy

## 2015-07-28 ENCOUNTER — Encounter: Payer: Self-pay | Admitting: Physical Therapy

## 2015-07-28 DIAGNOSIS — R29898 Other symptoms and signs involving the musculoskeletal system: Secondary | ICD-10-CM

## 2015-07-28 DIAGNOSIS — R269 Unspecified abnormalities of gait and mobility: Secondary | ICD-10-CM

## 2015-07-28 DIAGNOSIS — M545 Low back pain, unspecified: Secondary | ICD-10-CM

## 2015-07-28 NOTE — Therapy (Signed)
[facility] MAIN [facility] SERVICES [address] [place], [place], [address] Phone: [phone]   Fax:  [phone]  Physical Therapy Treatment  Patient Details  Name: Amber Bridges MRN: [id number] Date of Birth: [birth year] No Data Recorded  Encounter Date: 07/28/2015      PT End of Session - 07/28/15 1408    Visit Number 14   Number of Visits 25   Date for PT Re-Evaluation 08/31/15   Authorization Type 7   Authorization Time Period Gcode 10    PT Start Time 1300   PT Stop Time 1349   PT Time Calculation (min) 49 min   Equipment Utilized During Treatment Gait belt   Activity Tolerance Patient tolerated treatment well   Behavior During Therapy WFL for tasks assessed/performed      Past Medical History  Diagnosis Date  . Depression   . Hypertension   . Bronchitis     Past Surgical History  Procedure Laterality Date  . Spine surgery  1973, 1978, 2016    Most recent Laminectomy to T11 and L3    There were no vitals filed for this visit.  Visit Diagnosis:  Leg weakness, bilateral  Bilateral low back pain without sciatica  Abnormality of gait      Subjective Assessment - 07/28/15 1302    Subjective Patient reports that she is doing well upon arrival to PT. She states that she continues to walk more at home, but has not walked more than 1400 steps since monday, because her ankles hurt by the end of that day.    Patient is accompained by: Family member   Pertinent History Previous back surgery in 1973 at L5 for disc decompression and then scar tissue removal in 1978. She states that she has had back problems that would present every couple of years and usually get better with rest. Patient also has a history of multiple falls.   Limitations Standing;Walking   How long can you sit comfortably? Able to sit as long as needed    How long can you stand comfortably? able to stand for for up to 6 minutes without holding on    How  long can you walk comfortably? States that she can walk up to 100 ft at home with RW.    Diagnostic tests MRI 04/27/15 revealed severe spinal stenosis at L3-L4 and partial stenosis at T11-L3 and L5-S1. Patient underwent spinal Laminectomies of T11-T12 and L3-L4 on 04/28/15.    Patient Stated Goals Be able to walk with least restrictive device. Wants to be able to drive and go to store and shop for herself   Currently in Pain? No/denies          Nustep 2 minutes ( level 3, unbilled)   Lateral stepping with red tband x 3 laps, BUE finger tip 0 support  Diagonal stepping forward and backward  with red tband x 3 laps, BUE finger tip support   Seated therex with red tband on L LE and green on the R LE.   Hip flexion 2x 12 BLE  Knee extension 2x12 BLE  Knee flexion 2 x 12  BLE  Ankle DF x 8 BLE  Ankle eversion x8 BLE  Ankle PF x 8 BLE    Standing on airex with no AFO  Normal BOS, eyes open/ eyes closed 2 x 20 seconds  Weight shift L and R 2x 10 each direction  Anterior posterior weight shift x 1 minute   Standing  on solid surface semitandem stance, 30 seconds x 2 BLE March with BUE finger tip support 2x 15    PT provided min verbal instruction with balance training and therex to improve exercise technique including improved use of ankle strategy to decrease posterior LOB, improved weight shift over the L LE, as well as increase ROM and improved hip and knee flexion to increase strengthening and improve LE motor control.    Gait training with RW 90 ft, no AFO, supervision assist.  Gait training with Large based quad cane, no AFO, 4ft x 2  Gait training with small based quad cane, no AFO, x 40 ft  Patient was able to demonstrate 3 point step to gait pattern and progress to 3 point step through gait pattern following instruction from PT with both the large based quad cane and small based quad cane. Verbal instruction from PT also provided to increase step height to minimize foot drag and  to improve heel contact with BLE.                             PT Education - 07/28/15 1407    Education provided Yes   Education Details Gait training with [employer] cane. balance    Person(s) Educated Patient   Methods Explanation;Demonstration;Tactile cues;Verbal cues   Comprehension Verbalized understanding;Returned demonstration;Tactile cues required;Verbal cues required             PT Long Term Goals - 07/07/15 1559    PT LONG TERM GOAL #1   Title Patient will be independent with HEP to improve strength, balance and gait to increase function with tasks at home by 08/31/15.   Time 12   Period Weeks   Status On-going   PT LONG TERM GOAL #2   Title Patient will increase bilateral LE strength to at least 4+/5 throughout the LE to allow patient to access stairs in the community with greater ease by 08/31/15.   Time 12   Period Weeks   Status Partially Met   PT LONG TERM GOAL #3   Title Patient will score >46 on the Berg balance scale to reduce fall risk by 08/31/15.   Time 12   Period Weeks   Status Partially Met   PT LONG TERM GOAL #4   Title Patient will improve gait speed to >1.0 m/s with Least restrictive assistive device to reduce fall risk and increase ease of access to the community by 08/31/15.   Time 12   Period Weeks   Status Partially Met   PT LONG TERM GOAL #5   Title Patient will score >40/80 on the LEFS to indicate improved function with daily tasks and decreased impairment by 08/31/15.   Time 12   Period Weeks   Status Partially Met   Additional Long Term Goals   Additional Long Term Goals Yes   PT LONG TERM GOAL #6   Title Patient will increase 6 minute walk test by >300 ft to indicate improved function and increased access to the community. by 08/31/15   Time 8   Period Weeks   Status New               Plan - 07/28/15 1408    Clinical Impression Statement Patient instructed in balance training, strengthening, and gait  training. PT provided min verbal and tactile instruction for therex and balance including increased ROM, improved muscle control, increased used of ankle strategy to prevent LOB. Patient  progressed to gait training with small and large based quad cane without Bilateral AFO with 3 point gait pattern. Patient was able to demonstrate progression from step-to gait pattern to step-through following cues from PT; no LOB noted throughout gait training. Continued skilled PT is recommended to improve balance, strength and gait to allow increased independence within the home and community.     Pt will benefit from skilled therapeutic intervention in order to improve on the following deficits Abnormal gait;Decreased activity tolerance;Decreased balance;Decreased coordination;Decreased endurance;Decreased knowledge of precautions;Decreased knowledge of use of DME;Decreased mobility;Decreased range of motion;Decreased strength;Difficulty walking;Increased muscle spasms;Impaired perceived functional ability;Impaired flexibility;Impaired sensation;Improper body mechanics;Postural dysfunction;Obesity   Rehab Potential Good   Clinical Impairments Affecting Rehab Potential Positive: good family support, high PLOF. Negative: recent surgery, decreased sensation    PT Frequency 2x / week   PT Duration 12 weeks   PT Treatment/Interventions ADLs/Self Care Home Management;Cryotherapy;Electrical Stimulation;Moist Heat;DME Instruction;Gait training;Stair training;Functional mobility training;Therapeutic activities;Therapeutic exercise;Balance training;Neuromuscular re-education;Patient/family education;Orthotic Fit/Training;Wheelchair mobility training;Manual techniques;Passive range of motion;Energy conservation   PT Next Visit Plan Progress LE strength and power training, narrow BOS balance work.    PT Home Exercise Plan continue as given    Consulted and Agree with Plan of Care Patient        Problem List There are no  active problems to display for this patient.  Amber Bridges SPT 07/29/2015   12:16 PM   Amber Bridges 07/29/2015, 12:16 PM   This entire session was performed under direct supervision and direction of a licensed therapist/therapist assistant . I have personally read, edited and approve of the note as written. Amber Bridges, PT, DPT    [facility] MAIN [facility] SERVICES [address] [place], [place], [address] Phone: [phone]   Fax:  [phone]  Name: Amber Bridges MRN: [id number] Date of Birth: 08-Jun-1941

## 2015-08-02 ENCOUNTER — Encounter: Payer: Medicare Other | Admitting: Occupational Therapy

## 2015-08-02 ENCOUNTER — Ambulatory Visit: Payer: Medicare Other | Admitting: Physical Therapy

## 2015-08-02 DIAGNOSIS — R29898 Other symptoms and signs involving the musculoskeletal system: Secondary | ICD-10-CM

## 2015-08-02 DIAGNOSIS — M545 Low back pain, unspecified: Secondary | ICD-10-CM

## 2015-08-02 DIAGNOSIS — R269 Unspecified abnormalities of gait and mobility: Secondary | ICD-10-CM

## 2015-08-02 NOTE — Therapy (Addendum)
[place] MAIN [facility] SERVICES [address] [place], [place], [address] Phone: [phone]   Fax:  [phone]  Physical Therapy Treatment  Patient Details  Name: Amber Bridges MRN: [id number] Date of Birth: 18-Jun-1941 No Data Recorded  Encounter Date: 08/02/2015      PT End of Session - 08/02/15 1636    Visit Number 15   Number of Visits 25   Date for PT Re-Evaluation 08/31/15   Authorization Type 8   Authorization Time Period Gcode 10    PT Start Time 3817   PT Stop Time 1430   PT Time Calculation (min) 41 min   Equipment Utilized During Treatment Gait belt   Activity Tolerance Patient tolerated treatment well   Behavior During Therapy [facility] for tasks assessed/performed      Past Medical History  Diagnosis Date  . Depression   . Hypertension   . Bronchitis     Past Surgical History  Procedure Laterality Date  . Spine surgery  1973, 1978, 2016    Most recent Laminectomy to T11 and L3    There were no vitals filed for this visit.  Visit Diagnosis:  Leg weakness, bilateral  Bilateral low back pain without sciatica  Abnormality of gait      Subjective Assessment - 08/02/15 1359    Subjective Patient reports that she is doing well upon arrival to PT. She states that she tried walking serval times over the weekend without Bilateral AFO as well as performing balance exercises without AFOs. Stated that she felt like she ambulated better without the AFOs compared to with the AFOs using RW.    Patient is accompained by: Family member   Pertinent History Previous back surgery in 1973 at L5 for disc decompression and then scar tissue removal in 1978. She states that she has had back problems that would present every couple of years and usually get better with rest. Patient also has a history of multiple falls.   Limitations Standing;Walking   How long can you sit comfortably? Able to sit as long as needed    How long can you  stand comfortably? able to stand for for up to 6 minutes without holding on    How long can you walk comfortably? States that she can walk up to 100 ft at home with RW.    Diagnostic tests MRI 04/27/15 revealed severe spinal stenosis at L3-L4 and partial stenosis at T11-L3 and L5-S1. Patient underwent spinal Laminectomies of T11-T12 and L3-L4 on 04/28/15.    Patient Stated Goals Be able to walk with least restrictive device. Wants to be able to drive and go to store and shop for herself   Currently in Pain? No/denies          Treatment:   Nustep level 3, 2 minutes (nustep)  Quantum leg press 95# x 15, 105# x 12  quantum calf press 90# 2x 12   Standing on solid surface semitandem stance, 30 seconds x 2.  BLE Weight shift in lunge stance 3x30 seconds each direction  Standing on airex, no AFO, 2x 45 seconds  Standing on airex, no AFO, eyes closed 2x 10 seconds.     PT provided min verbal instruction with balance training and therex to improve exercise technique including improved use of ankle strategy to decrease posterior LOB, improved weight shift over the L LE, as well as cues for proper speed of movement to increase strengthening aspect of exercise.   Gait training  family support, high PLOF. Negative: recent surgery, decreased sensation    PT Frequency 2x / week   PT Duration 12 weeks   PT Treatment/Interventions ADLs/Self Care Home Management;Cryotherapy;Electrical Stimulation;Moist Heat;DME Instruction;Gait training;Stair training;Functional mobility training;Therapeutic activities;Therapeutic exercise;Balance training;Neuromuscular re-education;Patient/family education;Orthotic Fit/Training;Wheelchair mobility training;Manual techniques;Passive range of motion;Energy conservation   PT Next Visit Plan LE strengthening/power. gait.    PT Home Exercise Plan continue as given    Consulted and Agree with Plan of Care Patient        Problem List There are no active problems to display for this patient.  Barrie Folk SPT 08/02/2015   4:53 PM  This entire session was performed under direct supervision and direction of a licensed therapist . I have personally read, edited and approve of the note as written.  Norwood Levo. Hopkins, PT, DPT 08/02/2015, 4:53 PM  [place] MAIN [facility] SERVICES [address] [place], [place], [address] Phone: [phone]   Fax:  [phone]  Name: Amber Bridges MRN: [id number] Date of Birth: 06-Sep-1941  family support, high PLOF. Negative: recent surgery, decreased sensation    PT Frequency 2x / week   PT Duration 12 weeks   PT Treatment/Interventions ADLs/Self Care Home Management;Cryotherapy;Electrical Stimulation;Moist Heat;DME Instruction;Gait training;Stair training;Functional mobility training;Therapeutic activities;Therapeutic exercise;Balance training;Neuromuscular re-education;Patient/family education;Orthotic Fit/Training;Wheelchair mobility training;Manual techniques;Passive range of motion;Energy conservation   PT Next Visit Plan LE strengthening/power. gait.    PT Home Exercise Plan continue as given    Consulted and Agree with Plan of Care Patient        Problem List There are no active problems to display for this patient.  Barrie Folk SPT 08/02/2015   4:53 PM  This entire session was performed under direct supervision and direction of a licensed therapist . I have personally read, edited and approve of the note as written.  Norwood Levo. Hopkins, PT, DPT 08/02/2015, 4:53 PM  [place] MAIN [facility] SERVICES [address] [place], [place], [address] Phone: [phone]   Fax:  [phone]  Name: Amber Bridges MRN: [id number] Date of Birth: 06-Sep-1941

## 2015-08-04 ENCOUNTER — Encounter: Payer: Self-pay | Admitting: Physical Therapy

## 2015-08-04 ENCOUNTER — Encounter: Payer: Medicare Other | Admitting: Occupational Therapy

## 2015-08-04 ENCOUNTER — Ambulatory Visit: Payer: Medicare Other | Admitting: Physical Therapy

## 2015-08-04 DIAGNOSIS — M545 Low back pain, unspecified: Secondary | ICD-10-CM

## 2015-08-04 DIAGNOSIS — R29898 Other symptoms and signs involving the musculoskeletal system: Secondary | ICD-10-CM

## 2015-08-04 DIAGNOSIS — R269 Unspecified abnormalities of gait and mobility: Secondary | ICD-10-CM

## 2015-08-04 NOTE — Therapy (Signed)
of a licensed therapist . I have personally read, edited and approve of the note as written.  Hopkins,Margaret PT, DPT 08/05/2015, 9:12 AM  [place] [facility] SERVICES [address] [place], [place], [address] Phone: [phone]   Fax:  [phone]  Name: Amber Bridges MRN: [id number] Date of Birth: 03/04/1941  [place] MAIN [facility] SERVICES [address] [place], [place], [address] Phone: [phone]   Fax:  [phone]  Physical Therapy Treatment/Progress note 07/08/15 - 08/04/15   Patient Details  Name: Amber Bridges MRN: [id number] Date of Birth: Feb 26, 1941 Referring Provider: Juanda Bond, MD  Encounter Date: 08/04/2015      PT End of Session - 08/04/15 1519    Visit Number 16   Number of Visits 25   Date for PT Re-Evaluation 08/31/15   Authorization Type 1   Authorization Time Period Gcode 10    PT Start Time 1300   PT Stop Time 1345   PT Time Calculation (min) 45 min   Equipment Utilized During Treatment Gait belt   Activity Tolerance Patient tolerated treatment well   Behavior During Therapy [facility] for tasks assessed/performed      Past Medical History  Diagnosis Date  . Depression   . Hypertension   . Bronchitis     Past Surgical History  Procedure Laterality Date  . Spine surgery  1973, 1978, 2016    Most recent Laminectomy to T11 and L3    There were no vitals filed for this visit.  Visit Diagnosis:  Leg weakness, bilateral  Bilateral low back pain without sciatica  Abnormality of gait      Subjective Assessment - 08/04/15 1306    Subjective Patient reports that she has continued to utilize bilateral AFO at home and in the commnuity. She also reports that she was able to walk through her yard for the first time this morning, and reports that it went very well with no stumbles or falls.    Patient is accompained by: Family member   Pertinent History Previous back surgery in 1973 at L5 for disc decompression and then scar tissue removal in 1978. She states that she has had back problems that would present every couple of years and usually get better with rest. Patient also has a history of multiple falls.   Limitations Standing;Walking   How long can you sit comfortably? Able to sit as long as needed    How  long can you stand comfortably? able to stand for for up to 6 minutes without holding on    How long can you walk comfortably? States that she can walk up to 100 ft at home with RW.    Diagnostic tests MRI 04/27/15 revealed severe spinal stenosis at L3-L4 and partial stenosis at T11-L3 and L5-S1. Patient underwent spinal Laminectomies of T11-T12 and L3-L4 on 04/28/15.    Patient Stated Goals Be able to walk with least restrictive device. Wants to be able to drive and go to store and shop for herself   Currently in Pain? No/denies            [facility] PT Assessment - 08/04/15 1312    Assessment   Referring Provider Rockney Ghee Shuping, MD   Strength   Right Hip Flexion 4+/5   Right Hip Extension 4/5   Right Hip ABduction 5/5   Right Hip ADduction 5/5   Left Hip Flexion 4/5   Left Hip Extension 4+/5   Left Hip ABduction 5/5   Left Hip ADduction 5/5   Right Knee Flexion 5/5   Right Knee Extension 5/5   Left Knee Flexion 4+/5   Left Knee Extension 5/5   Right Ankle Dorsiflexion 4+/5   Right Ankle Plantar Flexion 4-/5   Left Ankle Dorsiflexion 4-/5   Left Ankle Plantar Flexion  of a licensed therapist . I have personally read, edited and approve of the note as written.  Hopkins,Margaret PT, DPT 08/05/2015, 9:12 AM  [place] [facility] SERVICES [address] [place], [place], [address] Phone: [phone]   Fax:  [phone]  Name: Amber Bridges MRN: [id number] Date of Birth: 03/04/1941  of a licensed therapist . I have personally read, edited and approve of the note as written.  Hopkins,Margaret PT, DPT 08/05/2015, 9:12 AM  [place] [facility] SERVICES [address] [place], [place], [address] Phone: [phone]   Fax:  [phone]  Name: Amber Bridges MRN: [id number] Date of Birth: 03/04/1941

## 2015-08-09 ENCOUNTER — Encounter: Payer: Medicare Other | Admitting: Occupational Therapy

## 2015-08-09 ENCOUNTER — Encounter: Payer: Self-pay | Admitting: Physical Therapy

## 2015-08-09 ENCOUNTER — Ambulatory Visit: Payer: Medicare Other | Attending: Physical Medicine and Rehabilitation | Admitting: Physical Therapy

## 2015-08-09 DIAGNOSIS — M545 Low back pain, unspecified: Secondary | ICD-10-CM

## 2015-08-09 DIAGNOSIS — R269 Unspecified abnormalities of gait and mobility: Secondary | ICD-10-CM | POA: Insufficient documentation

## 2015-08-09 DIAGNOSIS — R29898 Other symptoms and signs involving the musculoskeletal system: Secondary | ICD-10-CM | POA: Diagnosis present

## 2015-08-09 NOTE — Therapy (Signed)
strength to allow increased independence with ADLs and return to PLOF.   Pt will benefit from skilled therapeutic intervention in order to improve on the following deficits Decreased activity tolerance;Decreased balance;Decreased coordination;Decreased endurance;Decreased knowledge of precautions;Decreased knowledge of use of DME;Decreased mobility;Decreased range of motion;Decreased strength;Difficulty walking;Increased muscle spasms;Impaired perceived functional ability;Impaired flexibility;Impaired sensation;Improper body mechanics;Postural dysfunction;Obesity   Rehab Potential Good   Clinical Impairments Affecting Rehab Potential Positive: good family support, high PLOF. Negative: recent surgery, decreased sensation    PT Frequency 2x / week   PT Duration 12 weeks   PT Treatment/Interventions ADLs/Self Care Home Management;Cryotherapy;Electrical Stimulation;Moist Heat;DME Instruction;Gait training;Stair training;Functional mobility training;Therapeutic activities;Therapeutic exercise;Balance training;Neuromuscular re-education;Patient/family education;Orthotic Fit/Training;Wheelchair mobility training;Manual techniques;Passive range of motion;Energy conservation   PT Next Visit Plan gait training with SPC. LE strengthening.    PT Home Exercise Plan continue as given, but reduce UE support with balance exercises.    Consulted and Agree with Plan of Care Patient        Problem List There are no active problems to display for this patient.  Barrie Folk SPT 08/09/2015   4:03 PM  This entire session was performed under direct supervision and direction of a licensed therapist . I have personally read, edited and approve of the note as  written.  Hopkins,Margaret PT, DPT 08/09/2015, 4:03 PM  [place] MAIN [facility] SERVICES [address] [place], [place], [address] Phone: [phone]   Fax:  [phone]  Name: Amber Bridges MRN: [id number] Date of Birth: [birth year]  [place] MAIN [facility] SERVICES [address] [place], [place], [address] Phone: [phone]   Fax:  [phone]  Physical Therapy Treatment  Patient Details  Name: Amber Bridges MRN: [id number] Date of Birth: 1941/07/11 Referring Provider: Juanda Bond, MD  Encounter Date: 08/09/2015      PT End of Session - 08/09/15 1506    Visit Number 17   Number of Visits 25   Date for PT Re-Evaluation 08/31/15   Authorization Type 2   Authorization Time Period Gcode 10    PT Start Time 1300   PT Stop Time 1345   PT Time Calculation (min) 45 min   Equipment Utilized During Treatment Gait belt   Activity Tolerance Patient tolerated treatment well   Behavior During Therapy [facility] for tasks assessed/performed      Past Medical History  Diagnosis Date  . Depression   . Hypertension   . Bronchitis     Past Surgical History  Procedure Laterality Date  . Spine surgery  1973, 1978, 2016    Most recent Laminectomy to T11 and L3    There were no vitals filed for this visit.  Visit Diagnosis:  Leg weakness, bilateral  Bilateral low back pain without sciatica  Abnormality of gait      Subjective Assessment - 08/09/15 1307    Subjective Patient reports that she is doing very well upon arrival to PT. She reports that she has had no new falls since last PT visit and reports that she has tried walking with no AFOs and no AD within the home. No falls or near falls while walking without Assistance.    Patient is accompained by: Family member   Pertinent History Previous back surgery in 1973 at L5 for disc decompression and then scar tissue removal in 1978. She states that she has had back problems that would present every couple of years and usually get better with rest. Patient also has a history of multiple falls.   Limitations Standing;Walking   How long can you sit comfortably? Able to sit as long as needed    How long can you stand  comfortably? able to stand for for up to 6 minutes without holding on    How long can you walk comfortably? States that she can walk up to 100 ft at home with RW.    Diagnostic tests MRI 04/27/15 revealed severe spinal stenosis at L3-L4 and partial stenosis at T11-L3 and L5-S1. Patient underwent spinal Laminectomies of T11-T12 and L3-L4 on 04/28/15.    Patient Stated Goals Be able to walk with least restrictive device. Wants to be able to drive and go to store and shop for herself   Currently in Pain? No/denies     treatment:   Nustep level 4, 3 minutes (unbilled)     Ankle AROM  PF, DF, EV/IV 2x 12 each motion   Gait training:  CGA, SPC, 90 ft. 3 point gait pattern.  CGA, SPC 90 ft x 2, 2 point gait pattern, slow movements noted by patient. PT provided moderate verbal instruction for AD management as well as 3 point gait pattern. Patient was able to maintain 3 point gait pattern following instruction from PT. PT also provided moderate verbal and visual instruction for 2 point gait pattern. Patient responded moderately to instruction for 2 point gait pattern, and was able to maintain ~75% of the time. Cues to improve heel strike provided throughout gait training.   Stairs training.  strength to allow increased independence with ADLs and return to PLOF.   Pt will benefit from skilled therapeutic intervention in order to improve on the following deficits Decreased activity tolerance;Decreased balance;Decreased coordination;Decreased endurance;Decreased knowledge of precautions;Decreased knowledge of use of DME;Decreased mobility;Decreased range of motion;Decreased strength;Difficulty walking;Increased muscle spasms;Impaired perceived functional ability;Impaired flexibility;Impaired sensation;Improper body mechanics;Postural dysfunction;Obesity   Rehab Potential Good   Clinical Impairments Affecting Rehab Potential Positive: good family support, high PLOF. Negative: recent surgery, decreased sensation    PT Frequency 2x / week   PT Duration 12 weeks   PT Treatment/Interventions ADLs/Self Care Home Management;Cryotherapy;Electrical Stimulation;Moist Heat;DME Instruction;Gait training;Stair training;Functional mobility training;Therapeutic activities;Therapeutic exercise;Balance training;Neuromuscular re-education;Patient/family education;Orthotic Fit/Training;Wheelchair mobility training;Manual techniques;Passive range of motion;Energy conservation   PT Next Visit Plan gait training with SPC. LE strengthening.    PT Home Exercise Plan continue as given, but reduce UE support with balance exercises.    Consulted and Agree with Plan of Care Patient        Problem List There are no active problems to display for this patient.  Barrie Folk SPT 08/09/2015   4:03 PM  This entire session was performed under direct supervision and direction of a licensed therapist . I have personally read, edited and approve of the note as  written.  Hopkins,Margaret PT, DPT 08/09/2015, 4:03 PM  [place] MAIN [facility] SERVICES [address] [place], [place], [address] Phone: [phone]   Fax:  [phone]  Name: Amber Bridges MRN: [id number] Date of Birth: [birth year]

## 2015-08-11 ENCOUNTER — Encounter: Payer: Medicare Other | Admitting: Occupational Therapy

## 2015-08-11 ENCOUNTER — Encounter: Payer: Self-pay | Admitting: Physical Therapy

## 2015-08-11 ENCOUNTER — Ambulatory Visit: Payer: Medicare Other | Admitting: Physical Therapy

## 2015-08-11 DIAGNOSIS — R29898 Other symptoms and signs involving the musculoskeletal system: Secondary | ICD-10-CM

## 2015-08-11 DIAGNOSIS — R269 Unspecified abnormalities of gait and mobility: Secondary | ICD-10-CM

## 2015-08-11 DIAGNOSIS — M545 Low back pain, unspecified: Secondary | ICD-10-CM

## 2015-08-11 NOTE — Therapy (Signed)
[place] [facility] MAIN [facility] SERVICES [address] [place], [place], [address] Phone: [phone]   Fax:  [phone]  Physical Therapy Treatment  Patient Details  Name: Amber Bridges MRN: [id number] Date of Birth: 1940/11/17 Referring Provider: Lubertha South, MD  Encounter Date: 08/11/2015      PT End of Session - 08/11/15 1521    Visit Number 18   Number of Visits 25   Date for PT Re-Evaluation 08/31/15   Authorization Type 3   Authorization Time Period Gcode 10    PT Start Time 1301   PT Stop Time 1345   PT Time Calculation (min) 44 min   Equipment Utilized During Treatment Gait belt   Activity Tolerance Patient tolerated treatment well   Behavior During Therapy [facility] for tasks assessed/performed      Past Medical History  Diagnosis Date  . Depression   . Hypertension   . Bronchitis     Past Surgical History  Procedure Laterality Date  . Spine surgery  1973, 1978, 2016    Most recent Laminectomy to T11 and L3    There were no vitals filed for this visit.  Visit Diagnosis:  Leg weakness, bilateral  Abnormality of gait  Bilateral low back pain without sciatica      Subjective Assessment - 08/11/15 1309    Subjective Patient reports that she is doing very well upon arrival to PT. She arrived to PT walking with SPC and no AFO, and states that she has been using the cane to walk all over the house and is planning on using the [facility] to go shopping tomorrow. She does state that her SLS balance has not improved much over the last week or so.    Patient is accompained by: Family member   Pertinent History Previous back surgery in 1973 at L5 for disc decompression and then scar tissue removal in 1978. She states that she has had back problems that would present every couple of years and usually get better with rest. Patient also has a history of multiple falls.   Limitations Standing;Walking   How long can you sit comfortably?  Able to sit as long as needed    How long can you stand comfortably? able to stand for for up to 6 minutes without holding on    How long can you walk comfortably? States that she can walk up to 100 ft at home with RW.    Diagnostic tests MRI 04/27/15 revealed severe spinal stenosis at L3-L4 and partial stenosis at T11-L3 and L5-S1. Patient underwent spinal Laminectomies of T11-T12 and L3-L4 on 04/28/15.    Patient Stated Goals Be able to walk with least restrictive device. Wants to be able to drive and go to store and shop for herself   Currently in Pain? No/denies         Treatment:  Nustep level 4, 3 minutes ( unbilled)    Balance training.  Standing on firm surface: Normal BOS x 45 seconds  Narrow BOS 2x 45 seconds  Wide semi Tandem stance 2x 30 seconds each LE (required HHA to regain balance x1)  Narrow semi tandem stance 2x 30 seconds each LE  Stepping over cane and back x 10 each LE  SLS attempted x 6 each LE without UE support. Up to 4 seconds on the R LE, up to 2 seconds on the LLE   PT provided min verbal instruction to improve use of ankle strategy to reduce posterior  and lateral LOB as well as improved use of core activation with SLS to increase balance.   Gait training with SPC and CGA 366ft x 2 on level surface, 200 ft on uneven surface (cement sidewalk)    PT was required to provide moderate instruction for improve step length, step height and increased cadence. Patient responded very well to instruction for proper gait technique, but required seated rest break every 38ft.                        PT Education - 08/11/15 1521    Education provided Yes   Education Details Gait training. Transport planner) Educated Patient   Methods Explanation;Demonstration;Tactile cues   Comprehension Verbalized understanding;Returned demonstration;Verbal cues required;Tactile cues required             PT Long Term Goals - 08/04/15 1344    PT LONG  TERM GOAL #1   Title Patient will be independent with HEP to improve strength, balance and gait to increase function with tasks at home by 08/31/15.   Time 12   Period Weeks   Status On-going   PT LONG TERM GOAL #2   Title Patient will increase bilateral LE strength to at least 4+/5 throughout the LE to allow patient to access stairs in the community with greater ease by 08/31/15.   Time 12   Period Weeks   Status Partially Met   PT LONG TERM GOAL #3   Title Patient will score >46 on the Berg balance scale to reduce fall risk by 08/31/15.   Time 12   Period Weeks   Status Partially Met   PT LONG TERM GOAL #4   Title Patient will improve gait speed to >1.0 m/s with Least restrictive assistive device to reduce fall risk and increase ease of access to the community by 08/31/15.   Time 12   Period Weeks   Status Partially Met   PT LONG TERM GOAL #5   Title Patient will score >40/80 on the LEFS to indicate improved function with daily tasks and decreased impairment by 08/31/15.   Time 12   Period Weeks   Status Partially Met   PT LONG TERM GOAL #6   Title Patient will increase 6 minute walk test by >300 ft to indicate improved function and increased access to the community. by 08/31/15   Time 8   Period Weeks   Status Partially Met               Plan - 08/11/15 1522    Clinical Impression Statement Patient instructed in gait training with SPC as well as balance training. Patient was able to maintain 2 point gait pattern 90% of gait training today on various surfaces including tile, carpet, and cement sidewalk. PT was required to provide moderate instruction for improve step length, step height and increased cadence. Patient responded very well to instruction for proper gait technique, but required seated rest break every 326ft. Patient was able to maintain SLS on the R LE for 4 seconds and 2 seconds on the L. Continued skilled PT is required to improve balance, gait, and strength  to allow return to PLOF.   Pt will benefit from skilled therapeutic intervention in order to improve on the following deficits Decreased activity tolerance;Decreased balance;Decreased coordination;Decreased endurance;Decreased knowledge of precautions;Decreased knowledge of use of DME;Decreased mobility;Decreased range of motion;Decreased strength;Difficulty walking;Increased muscle spasms;Impaired perceived functional ability;Impaired flexibility;Impaired sensation;Improper body mechanics;Postural dysfunction;Obesity  Rehab Potential Good   Clinical Impairments Affecting Rehab Potential Positive: good family support, high PLOF. Negative: recent surgery, decreased sensation    PT Frequency 2x / week   PT Duration 12 weeks   PT Treatment/Interventions ADLs/Self Care Home Management;Cryotherapy;Electrical Stimulation;Moist Heat;DME Instruction;Gait training;Stair training;Functional mobility training;Therapeutic activities;Therapeutic exercise;Balance training;Neuromuscular re-education;Patient/family education;Orthotic Fit/Training;Wheelchair mobility training;Manual techniques;Passive range of motion;Energy conservation   PT Next Visit Plan LE strengthening. dynamic balance.    PT Home Exercise Plan continue as given    Consulted and Agree with Plan of Care Patient        Problem List There are no active problems to display for this patient.  Grier Rocher SPT 08/11/2015   4:01 PM  This entire session was performed under direct supervision and direction of a licensed therapist . I have personally read, edited and approve of the note as written.   Hopkins,Margaret PT, DPT 08/11/2015, 4:01 PM   [place] [facility] MAIN [facility] SERVICES [address] [place], [place], [address] Phone: [phone]   Fax:  [phone]  Name: Nicholina Winschel MRN: [id number] Date of Birth: [birth year]

## 2015-08-16 ENCOUNTER — Encounter: Payer: Self-pay | Admitting: Physical Therapy

## 2015-08-16 ENCOUNTER — Encounter: Payer: Medicare Other | Admitting: Occupational Therapy

## 2015-08-16 ENCOUNTER — Ambulatory Visit: Payer: Medicare Other | Admitting: Physical Therapy

## 2015-08-16 DIAGNOSIS — R269 Unspecified abnormalities of gait and mobility: Secondary | ICD-10-CM

## 2015-08-16 DIAGNOSIS — M545 Low back pain, unspecified: Secondary | ICD-10-CM

## 2015-08-16 DIAGNOSIS — R29898 Other symptoms and signs involving the musculoskeletal system: Secondary | ICD-10-CM

## 2015-08-16 NOTE — Therapy (Signed)
[place] MAIN [facility] SERVICES [address] [place], [place], [address] Phone: [phone]   Fax:  [phone]  Physical Therapy Treatment  Patient Details  Name: Amber Bridges MRN: [id number] Date of Birth: 1940-11-08 Referring Provider: Juanda Bond, MD  Encounter Date: 08/16/2015      PT End of Session - 08/16/15 1415    Visit Number 19   Number of Visits 25   Date for PT Re-Evaluation 08/31/15   Authorization Type 4   Authorization Time Period Gcode 10    PT Start Time 1320   PT Stop Time 1415   PT Time Calculation (min) 55 min   Equipment Utilized During Treatment Gait belt   Activity Tolerance Patient tolerated treatment well   Behavior During Therapy [facility] for tasks assessed/performed      Past Medical History  Diagnosis Date  . Depression   . Hypertension   . Bronchitis     Past Surgical History  Procedure Laterality Date  . Spine surgery  1973, 1978, 2016    Most recent Laminectomy to T11 and L3    There were no vitals filed for this visit.  Visit Diagnosis:  Leg weakness, bilateral  Abnormality of gait  Bilateral low back pain without sciatica      Subjective Assessment - 08/16/15 1326    Subjective Patient reports that she is doing well upon arrival to PT. She states that she is still using the RW of ambulation within the community and her [facility] within the home. She also states that she is starting to notice an improvement in her single leg stance balance on the R LE. Mild increase in numbness of the L lateral foot    Patient is accompained by: Family member   Pertinent History Previous back surgery in 1973 at L5 for disc decompression and then scar tissue removal in 1978. She states that she has had back problems that would present every couple of years and usually get better with rest. Patient also has a history of multiple falls.   Limitations Standing;Walking   How long can you sit comfortably?  Able to sit as long as needed    How long can you stand comfortably? able to stand for for up to 6 minutes without holding on    How long can you walk comfortably? States that she can walk up to 100 ft at home with RW.    Diagnostic tests MRI 04/27/15 revealed severe spinal stenosis at L3-L4 and partial stenosis at T11-L3 and L5-S1. Patient underwent spinal Laminectomies of T11-T12 and L3-L4 on 04/28/15.    Patient Stated Goals Be able to walk with least restrictive device. Wants to be able to drive and go to store and shop for herself   Currently in Pain? No/denies           Nustep level 3, 3 minutes ( unbilled)   Therex:  Sit to stand with 2 # ball overhead press x 10  Sit to stand with therapy ball toss 2x 12   PT provided min verbal instruction for improved  Forward weight shift and decreased knee valgus with sit to stand motion. Patient was able to make all balance correction as needed with ball toss and catch with only supervision assistance.   Standing therex with red tband  Hip flexion x 12 BLE Hip abduction x 15 BLE  Hip extension x 12 BLE  PT provided min verbal and tactile instruction to increase ROM, decrease  from PT with improved activation of hip musculature and increase balance control, but continues to demonstrate difficulty with narrow BOS activities. Patient was able to demonstrate consistent 2 point gait pattern using SPC with all gait within therapy gym throughout treatment session. Continued skilled PT is recommended to improve gait, increase strength and improve balance to allow patient to return to PLOF.     Pt will benefit from skilled therapeutic intervention in order to improve on the following deficits Decreased activity tolerance;Decreased balance;Decreased coordination;Decreased endurance;Decreased knowledge of precautions;Decreased knowledge of use of DME;Decreased mobility;Decreased range of motion;Decreased strength;Difficulty walking;Increased muscle spasms;Impaired perceived functional ability;Impaired flexibility;Impaired sensation;Improper body mechanics;Postural dysfunction;Obesity   Rehab Potential Good   Clinical Impairments Affecting Rehab Potential Positive: good family support, high PLOF. Negative: recent surgery, decreased sensation    PT Frequency 2x / week   PT Duration 12 weeks   PT Treatment/Interventions ADLs/Self Care Home Management;Cryotherapy;Electrical Stimulation;Moist Heat;DME Instruction;Gait training;Stair training;Functional mobility training;Therapeutic activities;Therapeutic exercise;Balance training;Neuromuscular re-education;Patient/family education;Orthotic Fit/Training;Wheelchair mobility training;Manual techniques;Passive range of motion;Energy conservation   PT Next Visit Plan gait training. dynamic balance.    PT Home Exercise  Plan increased resistance of standing home exercises to red tband.    Consulted and Agree with Plan of Care Patient        Problem List There are no active problems to display for this patient.  Barrie Folk SPT 08/17/2015   8:58 AM  This entire session was performed under direct supervision and direction of a licensed therapist. I have personally read, edited and approve of the note as written.  Hopkins,Margaret PT, DPT 08/17/2015, 8:58 AM  [place] MAIN [facility] SERVICES [address] [place], [place], [address] Phone: [phone]   Fax:  [phone]  Name: Krystyne Tewksbury MRN: [id number] Date of Birth: 1940/11/14  [place] MAIN [facility] SERVICES [address] [place], [place], [address] Phone: [phone]   Fax:  [phone]  Physical Therapy Treatment  Patient Details  Name: Amber Bridges MRN: [id number] Date of Birth: 1940-11-08 Referring Provider: Juanda Bond, MD  Encounter Date: 08/16/2015      PT End of Session - 08/16/15 1415    Visit Number 19   Number of Visits 25   Date for PT Re-Evaluation 08/31/15   Authorization Type 4   Authorization Time Period Gcode 10    PT Start Time 1320   PT Stop Time 1415   PT Time Calculation (min) 55 min   Equipment Utilized During Treatment Gait belt   Activity Tolerance Patient tolerated treatment well   Behavior During Therapy [facility] for tasks assessed/performed      Past Medical History  Diagnosis Date  . Depression   . Hypertension   . Bronchitis     Past Surgical History  Procedure Laterality Date  . Spine surgery  1973, 1978, 2016    Most recent Laminectomy to T11 and L3    There were no vitals filed for this visit.  Visit Diagnosis:  Leg weakness, bilateral  Abnormality of gait  Bilateral low back pain without sciatica      Subjective Assessment - 08/16/15 1326    Subjective Patient reports that she is doing well upon arrival to PT. She states that she is still using the RW of ambulation within the community and her [facility] within the home. She also states that she is starting to notice an improvement in her single leg stance balance on the R LE. Mild increase in numbness of the L lateral foot    Patient is accompained by: Family member   Pertinent History Previous back surgery in 1973 at L5 for disc decompression and then scar tissue removal in 1978. She states that she has had back problems that would present every couple of years and usually get better with rest. Patient also has a history of multiple falls.   Limitations Standing;Walking   How long can you sit comfortably?  Able to sit as long as needed    How long can you stand comfortably? able to stand for for up to 6 minutes without holding on    How long can you walk comfortably? States that she can walk up to 100 ft at home with RW.    Diagnostic tests MRI 04/27/15 revealed severe spinal stenosis at L3-L4 and partial stenosis at T11-L3 and L5-S1. Patient underwent spinal Laminectomies of T11-T12 and L3-L4 on 04/28/15.    Patient Stated Goals Be able to walk with least restrictive device. Wants to be able to drive and go to store and shop for herself   Currently in Pain? No/denies           Nustep level 3, 3 minutes ( unbilled)   Therex:  Sit to stand with 2 # ball overhead press x 10  Sit to stand with therapy ball toss 2x 12   PT provided min verbal instruction for improved  Forward weight shift and decreased knee valgus with sit to stand motion. Patient was able to make all balance correction as needed with ball toss and catch with only supervision assistance.   Standing therex with red tband  Hip flexion x 12 BLE Hip abduction x 15 BLE  Hip extension x 12 BLE  PT provided min verbal and tactile instruction to increase ROM, decrease

## 2015-08-18 ENCOUNTER — Encounter: Payer: Self-pay | Admitting: Physical Therapy

## 2015-08-18 ENCOUNTER — Encounter: Payer: Medicare Other | Admitting: Occupational Therapy

## 2015-08-18 ENCOUNTER — Ambulatory Visit: Payer: Medicare Other | Admitting: Physical Therapy

## 2015-08-18 DIAGNOSIS — M545 Low back pain, unspecified: Secondary | ICD-10-CM

## 2015-08-18 DIAGNOSIS — R269 Unspecified abnormalities of gait and mobility: Secondary | ICD-10-CM

## 2015-08-18 DIAGNOSIS — R29898 Other symptoms and signs involving the musculoskeletal system: Secondary | ICD-10-CM | POA: Diagnosis not present

## 2015-08-18 NOTE — Therapy (Signed)
[place] [facility] [address] [place], [place], [address] Phone: [phone]   Fax:  [phone]  Physical Therapy Treatment  Patient Details  Name: Amber Bridges MRN: [id number] Date of Birth: 09/24/1941 Referring Provider: Lee Thomas Shuping, MD  Encounter Date: 08/18/2015      PT End of Session - 08/18/15 1349    Visit Number 20   Number of Visits 25   Date for PT Re-Evaluation 08/31/15   Authorization Type 5   Authorization Time Period Gcode 10    PT Start Time 1300   PT Stop Time 1340   PT Time Calculation (min) 40 min   Equipment Utilized During Treatment Gait belt   Activity Tolerance Patient tolerated treatment well   Behavior During Therapy WFL for tasks assessed/performed      Past Medical History  Diagnosis Date  . Depression   . Hypertension   . Bronchitis     Past Surgical History  Procedure Laterality Date  . Spine surgery  1973, 1978, 2016    Most recent Laminectomy to T11 and L3    There were no vitals filed for this visit.  Visit Diagnosis:  Leg weakness, bilateral  Abnormality of gait  Bilateral low back pain without sciatica      Subjective Assessment - 08/18/15 1304    Subjective Patient arrived to PT ambulating with SPC. She reports that she is a little tired from going to a stained glass glass this morning utilizing SPC for mobility.    Patient is accompained by: Family member   Pertinent History Previous back surgery in 1973 at L5 for disc decompression and then scar tissue removal in 1978. She states that she has had back problems that would present every couple of years and usually get better with rest. Patient also has a history of multiple falls.   Limitations Standing;Walking   How long can you sit comfortably? Able to sit as long as needed    How long can you stand comfortably? able to stand for for up to 6 minutes without holding on    How long can you walk  comfortably? States that she can walk up to 100 ft at home with RW.    Diagnostic tests MRI 04/27/15 revealed severe spinal stenosis at L3-L4 and partial stenosis at T11-L3 and L5-S1. Patient underwent spinal Laminectomies of T11-T12 and L3-L4 on 04/28/15.    Patient Stated Goals Be able to walk with least restrictive device. Wants to be able to drive and go to store and shop for herself   Currently in Pain? No/denies         Treatment:   Quantum leg press 2x 10, 90# Quantum calf press 2x 10, 90#  Cues for proper positioning with Leg press and decreased speed of movement to increase strengthening   Forward step ups x 10 BLE , 1 HHA Lateral step ups x 10 BLE, 2 HHA Forward step over half bolster and back x 11 BLE with SPC   Cues for proper UE support improved weight shift over the L LE, SPC positioning, increased step height and step length as well as improved heel contact. Patient Responded well to instruction from PT. She was noted to have mild difficulty with L LE step up and R LE step over.   Stair training 4 steps x 2 with 1 rail and SPC.  Side stepping in //bars x 5 each direction no HHA  Retro gait in //bars x   Clinical Impairments Affecting Rehab Potential Positive: good family support, high PLOF. Negative: recent surgery, decreased sensation    PT Frequency 2x / week   PT Duration 12 weeks   PT Treatment/Interventions ADLs/Self Care Home Management;Cryotherapy;Electrical Stimulation;Moist Heat;DME Instruction;Gait training;Stair training;Functional mobility training;Therapeutic activities;Therapeutic exercise;Balance training;Neuromuscular re-education;Patient/family education;Orthotic Fit/Training;Wheelchair mobility training;Manual techniques;Passive range of motion;Energy conservation   PT Next Visit Plan static and dynamic balance.    PT Home Exercise Plan continue as given    Consulted and Agree with Plan of Care Patient        Problem List There are no active problems to display for this patient.  Aarionna Germer SPT 08/18/2015   2:32 PM  This entire session was performed under direct supervision and direction of a licensed therapist . I have personally read, edited and approve of the note as written.  Hopkins,Margaret PT, DPT 08/18/2015, 2:32 PM    [facility] [address] [place], [place], [address] Phone: [phone]   Fax:  [phone]  Name: Andretta Elizabeth Witters MRN: [id number] Date of Birth: 05/06/1941       [place]  [facility] [address] [place], [place], [address] Phone: [phone]   Fax:  [phone]  Physical Therapy Treatment  Patient Details  Name: Amber Bridges MRN: [id number] Date of Birth: 08/12/1941 Referring Provider: Lee Thomas Shuping, MD  Encounter Date: 08/18/2015      PT End of Session - 08/18/15 1349    Visit Number 20   Number of Visits 25   Date for PT Re-Evaluation 08/31/15   Authorization Type 5   Authorization Time Period Gcode 10    PT Start Time 1300   PT Stop Time 1340   PT Time Calculation (min) 40 min   Equipment Utilized During Treatment Gait belt   Activity Tolerance Patient tolerated treatment well   Behavior During Therapy WFL for tasks assessed/performed      Past Medical History  Diagnosis Date  . Depression   . Hypertension   . Bronchitis     Past Surgical History  Procedure Laterality Date  . Spine surgery  1973, 1978, 2016    Most recent Laminectomy to T11 and L3    There were no vitals filed for this visit.  Visit Diagnosis:  Leg weakness, bilateral  Abnormality of gait  Bilateral low back pain without sciatica      Subjective Assessment - 08/18/15 1304    Subjective Patient arrived to PT ambulating with SPC. She reports that she is a little tired from going to a stained glass glass this morning utilizing SPC for mobility.    Patient is accompained by: Family member   Pertinent History Previous back surgery in 1973 at L5 for disc decompression and then scar tissue removal in 1978. She states that she has had back problems that would present every couple of years and usually get better with rest. Patient also has a history of multiple falls.   Limitations Standing;Walking   How long can you sit comfortably? Able to sit as long as needed    How long can you stand comfortably? able to stand for for up to 6 minutes without holding on    How long can you walk  comfortably? States that she can walk up to 100 ft at home with RW.    Diagnostic tests MRI 04/27/15 revealed severe spinal stenosis at L3-L4 and partial stenosis at T11-L3 and L5-S1. Patient underwent spinal Laminectomies of T11-T12 and L3-L4 on 04/28/15.    Patient Stated Goals Be able to walk with least restrictive device. Wants to be able to drive and go to store and shop for herself   Currently in Pain? No/denies         Treatment:   Quantum leg press 2x 10, 90# Quantum calf press 2x 10, 90#  Cues for proper positioning with Leg press and decreased speed of movement to increase strengthening   Forward step ups x 10 BLE , 1 HHA Lateral step ups x 10 BLE, 2 HHA Forward step over half bolster and back x 11 BLE with SPC   Cues for proper UE support improved weight shift over the L LE, SPC positioning, increased step height and step length as well as improved heel contact. Patient Responded well to instruction from PT. She was noted to have mild difficulty with L LE step up and R LE step over.   Stair training 4 steps x 2 with 1 rail and SPC.  Side stepping in //bars x 5 each direction no HHA  Retro gait in //bars x

## 2015-08-23 ENCOUNTER — Ambulatory Visit: Payer: Medicare Other | Admitting: Physical Therapy

## 2015-08-23 ENCOUNTER — Encounter: Payer: Self-pay | Admitting: Physical Therapy

## 2015-08-23 ENCOUNTER — Encounter: Payer: Medicare Other | Admitting: Occupational Therapy

## 2015-08-23 DIAGNOSIS — R269 Unspecified abnormalities of gait and mobility: Secondary | ICD-10-CM

## 2015-08-23 DIAGNOSIS — R29898 Other symptoms and signs involving the musculoskeletal system: Secondary | ICD-10-CM | POA: Diagnosis not present

## 2015-08-23 NOTE — Therapy (Signed)
[place] [facility] MAIN [facility] SERVICES [address] [place], [place], [address] Phone: [phone]   Fax:  [phone]  Physical Therapy Treatment  Patient Details  Name: Amber Bridges MRN: [id number] Date of Birth: April 06, 1941 Referring Provider: Lubertha South, MD  Encounter Date: 08/23/2015      PT End of Session - 08/23/15 1425    Visit Number 21   Number of Visits 25   Date for PT Re-Evaluation 08/31/15   Authorization Type 6   Authorization Time Period Gcode 10    PT Start Time 1300   PT Stop Time 1345   PT Time Calculation (min) 45 min   Equipment Utilized During Treatment Gait belt   Activity Tolerance Patient tolerated treatment well   Behavior During Therapy [facility] for tasks assessed/performed      Past Medical History  Diagnosis Date  . Depression   . Hypertension   . Bronchitis     Past Surgical History  Procedure Laterality Date  . Spine surgery  1973, 1978, 2016    Most recent Laminectomy to T11 and L3    There were no vitals filed for this visit.  Visit Diagnosis:  Leg weakness, bilateral  Abnormality of gait      Subjective Assessment - 08/23/15 1303    Subjective Patient reports that she is doing very well upon arrival to PT. She reports that she has started doing some laundry and vacuuming at home as well as walking in the home with out an assistive device. She reports that she feels like her steps are smaller when walking without an AD.    Patient is accompained by: Family member   Pertinent History Previous back surgery in 1973 at L5 for disc decompression and then scar tissue removal in 1978. She states that she has had back problems that would present every couple of years and usually get better with rest. Patient also has a history of multiple falls.   Limitations Standing;Walking   How long can you sit comfortably? Able to sit as long as needed    How long can you stand comfortably? able to stand  for for up to 6 minutes without holding on    How long can you walk comfortably? States that she can walk up to 100 ft at home with RW.    Diagnostic tests MRI 04/27/15 revealed severe spinal stenosis at L3-L4 and partial stenosis at T11-L3 and L5-S1. Patient underwent spinal Laminectomies of T11-T12 and L3-L4 on 04/28/15.    Patient Stated Goals Be able to walk with least restrictive device. Wants to be able to drive and go to store and shop for herself   Currently in Pain? No/denies         Treatment:   Quantum leg press 120# 2x 8  Quantum calf press 95# 2x 8  Step up to 5 inch step. RLE x 10, L LE x 8  No UE support Lateral lunges with finger tip support. X 10 BLE.   PT provided min Cues for decreased UE support, improved step length with lunges, improved control with eccentric movement of leg press and step downs. Patient responded very well instruction and was able to perform step ups/down without HHA.   Gait training within PT gym 45ft. CGA, no AD Gait training in hall way 168ft CGA. No AD Walking in step ladder one foot in each rung x 4 laps  on HHA, CGA provided by PT Side stepping in step ladder  x 4 laps no HHA, CGA provided by PT  Ascend and descend 4 steps x 3 . Step over step pattern with ascent and step to pattern with descent ( leading with the L LE) supervision assist provided by PT  Ascend and descend 12 steps x 1 . Step to pattern leading with the R LE for ascent and leading with the L LE for descent. Supervision assist provided by PT.    Patient demonstrated mild foot slap and decreased R terminal knee extension. PT provided min verbal instruction to improve step length, increase L LE heel contact and improve terminal knee extension. Patient also required min verbal instruction for proper AD management with stair training. Patient responded well to instruction from PT and demonstrated improved terminal knee extension and heel contact as well as improved use of AD on steps.                           PT Education - 08/23/15 1308    Education provided Yes   Education Details LE strengthening. gait training.    Person(s) Educated Patient   Methods Explanation;Demonstration;Tactile cues;Verbal cues   Comprehension Verbalized understanding;Returned demonstration;Verbal cues required;Tactile cues required             PT Long Term Goals - 08/04/15 1344    PT LONG TERM GOAL #1   Title Patient will be independent with HEP to improve strength, balance and gait to increase function with tasks at home by 08/31/15.   Time 12   Period Weeks   Status On-going   PT LONG TERM GOAL #2   Title Patient will increase bilateral LE strength to at least 4+/5 throughout the LE to allow patient to access stairs in the community with greater ease by 08/31/15.   Time 12   Period Weeks   Status Partially Met   PT LONG TERM GOAL #3   Title Patient will score >46 on the Berg balance scale to reduce fall risk by 08/31/15.   Time 12   Period Weeks   Status Partially Met   PT LONG TERM GOAL #4   Title Patient will improve gait speed to >1.0 m/s with Least restrictive assistive device to reduce fall risk and increase ease of access to the community by 08/31/15.   Time 12   Period Weeks   Status Partially Met   PT LONG TERM GOAL #5   Title Patient will score >40/80 on the LEFS to indicate improved function with daily tasks and decreased impairment by 08/31/15.   Time 12   Period Weeks   Status Partially Met   PT LONG TERM GOAL #6   Title Patient will increase 6 minute walk test by >300 ft to indicate improved function and increased access to the community. by 08/31/15   Time 8   Period Weeks   Status Partially Met               Plan - 08/23/15 1425    Clinical Impression Statement Patient instructed in gait training without AD as well as stair training with 1 rail support and LE strengthening. Patient was able to ambulate up to 150 ft without  AD on tile floor and carpet. Mild instruction needed to improve terminal knee extension increased heel contact on the L LE. Patient was able to ascend and descend 12 steps using SPC and 1 rail assist with supervision assist with step to gait pattern; min verbal instruction  required for AD management. Patient was also able to complete increased LE strengthening exercises including step ups on 4" step without HHA. Continued skilled PT is recommended to improve LE strength, improve balance and decrease AD use to allow return to PLOF.   Pt will benefit from skilled therapeutic intervention in order to improve on the following deficits Decreased activity tolerance;Decreased balance;Decreased coordination;Decreased endurance;Decreased knowledge of precautions;Decreased knowledge of use of DME;Decreased mobility;Decreased range of motion;Decreased strength;Difficulty walking;Increased muscle spasms;Impaired perceived functional ability;Impaired flexibility;Impaired sensation;Improper body mechanics;Postural dysfunction;Obesity   Rehab Potential Good   Clinical Impairments Affecting Rehab Potential Positive: good family support, high PLOF. Negative: recent surgery, decreased sensation    PT Frequency 2x / week   PT Duration 12 weeks   PT Treatment/Interventions ADLs/Self Care Home Management;Cryotherapy;Electrical Stimulation;Moist Heat;DME Instruction;Gait training;Stair training;Functional mobility training;Therapeutic activities;Therapeutic exercise;Balance training;Neuromuscular re-education;Patient/family education;Orthotic Fit/Training;Wheelchair mobility training;Manual techniques;Passive range of motion;Energy conservation   PT Next Visit Plan increased gait training.    PT Home Exercise Plan continue as given    Consulted and Agree with Plan of Care Patient        Problem List There are no active problems to display for this patient.  Grier Rocher SPT 08/23/2015   4:36 PM  This entire session  was performed under direct supervision and direction of a licensed therapist/therapist assistant . I have personally read, edited and approve of the note as written.  Hopkins,Margaret PT, DPT 08/23/2015, 4:36 PM  [place] [facility] MAIN [facility] SERVICES [address] [place], [place], [address] Phone: [phone]   Fax:  [phone]  Name: Amber Bridges MRN: [id number] Date of Birth: Oct 23, 1940

## 2015-08-25 ENCOUNTER — Encounter: Payer: Medicare Other | Admitting: Occupational Therapy

## 2015-08-25 ENCOUNTER — Ambulatory Visit: Payer: Medicare Other

## 2015-08-25 VITALS — BP 138/62 | HR 60

## 2015-08-25 DIAGNOSIS — R29898 Other symptoms and signs involving the musculoskeletal system: Secondary | ICD-10-CM | POA: Diagnosis not present

## 2015-08-25 DIAGNOSIS — R269 Unspecified abnormalities of gait and mobility: Secondary | ICD-10-CM

## 2015-08-25 NOTE — Therapy (Signed)
[place] MAIN [facility] SERVICES [address] [place], [place], [address] Phone: [phone]   Fax:  [phone]  Physical Therapy Treatment  Patient Details  Name: Amber Bridges MRN: [id number] Date of Birth: [birth year] Referring Provider: Juanda Bond, MD  Encounter Date: 08/25/2015      PT End of Session - 08/25/15 1407    Visit Number 22   Number of Visits 25   Date for PT Re-Evaluation 08/31/15   Authorization Type 7   Authorization Time Period Gcode 10    PT Start Time 1301   PT Stop Time 1346   PT Time Calculation (min) 45 min   Equipment Utilized During Treatment Gait belt   Activity Tolerance Patient tolerated treatment well   Behavior During Therapy [facility] for tasks assessed/performed      Past Medical History  Diagnosis Date  . Depression   . Hypertension   . Bronchitis     Past Surgical History  Procedure Laterality Date  . Spine surgery  1973, 1978, 2016    Most recent Laminectomy to T11 and L3    Filed Vitals:   08/25/15 1307  BP: 138/62  Pulse: 60  SpO2: 100%    Visit Diagnosis:  Leg weakness, bilateral  Abnormality of gait      Subjective Assessment - 08/25/15 1304    Subjective Pt reports she is doing well. No pain currently. She had a follow-up phone call with [facility] who was very pleased with her progress. Pt may be interested in finishing therapy next week. Pt reports she is considering going out to walk in the yard while her kids are in town this weekend. Pt is performing HEP without issue and has no specific questions or concerns at this time.    Patient is accompained by: Family member   Pertinent History Previous back surgery in 1973 at L5 for disc decompression and then scar tissue removal in 1978. She states that she has had back problems that would present every couple of years and usually get better with rest. Patient also has a history of multiple falls.   Limitations Standing;Walking    How long can you sit comfortably? Able to sit as long as needed    How long can you stand comfortably? able to stand for for up to 6 minutes without holding on    How long can you walk comfortably? States that she can walk up to 100 ft at home with RW.    Diagnostic tests MRI 04/27/15 revealed severe spinal stenosis at L3-L4 and partial stenosis at T11-L3 and L5-S1. Patient underwent spinal Laminectomies of T11-T12 and L3-L4 on 04/28/15.    Patient Stated Goals Be able to walk with least restrictive device. Wants to be able to drive and go to store and shop for herself   Currently in Pain? No/denies          Neuromuscular Re-education  Airex: NBOS eyes open, closed, horizontal head turns, and vertical head turns x 60 seconds each; Cone taps with 1 cone sequencing alternating LE on command, bilateral UE 1 finger support followed by single UE and then no UE support; NBOS forward reaching with SAEBO alternating UEs and progressively increasing reaching distance;  Perturbations: WBOS balance with forward/backward perturbations and R/L perturbations working on stepping strategy;  Rocker board A/P and R/L orientation, weight shifts, static balance, balance with horizontal and vertical head turns;  Ambulation Ambulation in hallway without spc with horizontal and vertical head turns  PT Duration 12 weeks   PT Treatment/Interventions ADLs/Self Care Home Management;Cryotherapy;Electrical Stimulation;Moist Heat;DME Instruction;Gait training;Stair training;Functional mobility training;Therapeutic activities;Therapeutic exercise;Balance training;Neuromuscular re-education;Patient/family education;Orthotic Fit/Training;Wheelchair mobility training;Manual techniques;Passive range of motion;Energy conservation   PT Next Visit Plan Repeat outcome measures and discuss patient's wishes for continued therapy vs discharge, increased gait training across uneven surfaces.    PT Home Exercise Plan continue as given    Consulted and Agree with Plan of Care Patient        Problem List There are no active problems to display for this patient.  Phillips Grout PT, DPT   Amber Bridges 08/25/2015, 2:13 PM  [place] MAIN [facility] SERVICES [address] [place], [place], [address] Phone: [phone]   Fax:  [phone]  Name: Amber Bridges MRN: [id number] Date of Birth: 1941/01/21  PT Duration 12 weeks   PT Treatment/Interventions ADLs/Self Care Home Management;Cryotherapy;Electrical Stimulation;Moist Heat;DME Instruction;Gait training;Stair training;Functional mobility training;Therapeutic activities;Therapeutic exercise;Balance training;Neuromuscular re-education;Patient/family education;Orthotic Fit/Training;Wheelchair mobility training;Manual techniques;Passive range of motion;Energy conservation   PT Next Visit Plan Repeat outcome measures and discuss patient's wishes for continued therapy vs discharge, increased gait training across uneven surfaces.    PT Home Exercise Plan continue as given    Consulted and Agree with Plan of Care Patient        Problem List There are no active problems to display for this patient.  Phillips Grout PT, DPT   Amber Bridges 08/25/2015, 2:13 PM  [place] MAIN [facility] SERVICES [address] [place], [place], [address] Phone: [phone]   Fax:  [phone]  Name: Amber Bridges MRN: [id number] Date of Birth: 1941/01/21

## 2015-08-30 ENCOUNTER — Encounter: Payer: Self-pay | Admitting: Physical Therapy

## 2015-08-30 ENCOUNTER — Encounter: Payer: Medicare Other | Admitting: Occupational Therapy

## 2015-08-30 ENCOUNTER — Ambulatory Visit: Payer: Medicare Other | Admitting: Physical Therapy

## 2015-08-30 DIAGNOSIS — M545 Low back pain, unspecified: Secondary | ICD-10-CM

## 2015-08-30 DIAGNOSIS — R29898 Other symptoms and signs involving the musculoskeletal system: Secondary | ICD-10-CM

## 2015-08-30 DIAGNOSIS — R269 Unspecified abnormalities of gait and mobility: Secondary | ICD-10-CM

## 2015-08-30 NOTE — Therapy (Addendum)
m/s with Least restrictive assistive device to reduce fall risk and increase ease of access to the community by 08/31/15.   Time 12   Period Weeks   Status Achieved   PT LONG TERM GOAL #5   Title Patient will score >40/80 on the LEFS to indicate improved function with daily tasks and decreased impairment by 08/31/15.   Time 12   Period Weeks   Status Achieved   PT LONG TERM GOAL #6   Title Patient will increase 6 minute walk test by >300 ft to indicate improved function and increased access to the community. by 08/31/15   Time 8   Period Weeks   Status Achieved               Plan - 2015-09-23 1346    Clinical Impression Statement Patient demonstrates significant improvement in balance and gait ability. PT instructed patient in outcome measures to assess progress towards goals. Patient was able to walk at a community ambulator speed without AD and without AFOs. She tested as a low fall risk today. PT recommends discharge from PT at this time as patient as met all goals. Patient is agareeable.   Pt will benefit from skilled therapeutic intervention in order to improve on the following deficits Decreased activity tolerance;Decreased balance;Decreased coordination;Decreased endurance;Decreased knowledge of precautions;Decreased knowledge of use of DME;Decreased mobility;Decreased range of motion;Decreased strength;Difficulty walking;Increased muscle spasms;Impaired perceived functional ability;Impaired flexibility;Impaired sensation;Improper body mechanics;Postural dysfunction;Obesity   Rehab Potential Good   Clinical Impairments Affecting Rehab  Potential Positive: good family support, high PLOF. Negative: recent surgery, decreased sensation    PT Frequency 2x / week   PT Duration 12 weeks   PT Treatment/Interventions ADLs/Self Care Home Management;Cryotherapy;Electrical Stimulation;Moist Heat;DME Instruction;Gait training;Stair training;Functional mobility training;Therapeutic activities;Therapeutic exercise;Balance training;Neuromuscular re-education;Patient/family education;Orthotic Fit/Training;Wheelchair mobility training;Manual techniques;Passive range of motion;Energy conservation   PT Next Visit Plan discharge from [facility] continue as given    Consulted and Agree with Plan of Care Patient          G-Codes - 09/23/2015 1347    Functional Assessment Tool Used LEFS, 44mwalk, MMT,Berg    Functional Limitation Mobility: Walking and moving around   Mobility: Walking and Moving Around Goal Status ([phone] At least 20 percent but less than 40 percent impaired, limited or restricted   Mobility: Walking and Moving Around Discharge Status ([id number] At least 1 percent but less than 20 percent impaired, limited or restricted      Problem List There are no active problems to display for this patient.   Hopkins,Amber Bridges PT, DPT 12016-12-16 1:48 PM  [place] [facility] [address] [place] [address]Phone: [phone]  Fax:  [phone] Name: PNavdeep FessendenMRN: [id number] of Birth: 413-Dec-1942  [place] MAIN [facility] SERVICES [address] [place], [place], [address] Phone: [phone]   Fax:  [phone]  Physical Therapy Treatment/Discharge summary  Patient Details  Name: Amber Bridges MRN: [id number] Date of Birth: 30-Apr-1941 Referring Provider: Juanda Bond, MD  Encounter Date: 08/30/2015      PT End of Session - 08/30/15 1340    Visit Number 23   Number of Visits 25   Date for PT Re-Evaluation 08/31/15   Authorization Type 7   Authorization Time Period Gcode 10    PT Start Time 1302   PT Stop Time 1345   PT Time Calculation (min) 43 min   Equipment Utilized During Treatment Gait belt   Activity Tolerance Patient tolerated treatment well   Behavior During Therapy [facility] for tasks assessed/performed      Past Medical History  Diagnosis Date  . Depression   . Hypertension   . Bronchitis     Past Surgical History  Procedure Laterality Date  . Spine surgery  1973, 1978, 2016    Most recent Laminectomy to T11 and L3    There were no vitals filed for this visit.  Visit Diagnosis:  Leg weakness, bilateral  Abnormality of gait  Bilateral low back pain without sciatica      Subjective Assessment - 08/30/15 1317    Subjective Patient reports doing well. She reports compliance with HEP. Patient denies any new falls. She is able to walk short distances at home without AD and without AFOs.    Patient is accompained by: Family member   Pertinent History Previous back surgery in 1973 at L5 for disc decompression and then scar tissue removal in 1978. She states that she has had back problems that would present every couple of years and usually get better with rest. Patient also has a history of multiple falls.   Limitations Standing;Walking   How long can you sit comfortably? Able to sit as long as needed    How long can you stand comfortably? able to stand for for up to 6 minutes without holding on    How long  can you walk comfortably? States that she can walk up to 100 ft at home with RW.    Diagnostic tests MRI 04/27/15 revealed severe spinal stenosis at L3-L4 and partial stenosis at T11-L3 and L5-S1. Patient underwent spinal Laminectomies of T11-T12 and L3-L4 on 04/28/15.    Patient Stated Goals Be able to walk with least restrictive device. Wants to be able to drive and go to store and shop for herself   Currently in Pain? No/denies            [facility] PT Assessment - 08/30/15 0001    Observation/Other Assessments   Lower Extremity Functional Scale  55/80 (the higher the score the greater the mobility); improved from last reassessment on 07/08/15 which was 39/80   Strength   Right Hip Flexion 4+/5   Right Hip Extension 4+/5   Right Hip ABduction 5/5   Right Hip ADduction 5/5   Left Hip Flexion 4+/5   Left Hip Extension 4+/5   Left Hip ABduction 5/5   Left Hip ADduction 5/5   Right Knee Flexion 5/5   Right Knee Extension 5/5   Left Knee Flexion 5/5   Left Knee Extension 5/5   Right Ankle Dorsiflexion 4+/5   Right Ankle Plantar Flexion 4+/5   Left Ankle Dorsiflexion 4+/5   Left Ankle Plantar Flexion 4+/5   6  m/s with Least restrictive assistive device to reduce fall risk and increase ease of access to the community by 08/31/15.   Time 12   Period Weeks   Status Achieved   PT LONG TERM GOAL #5   Title Patient will score >40/80 on the LEFS to indicate improved function with daily tasks and decreased impairment by 08/31/15.   Time 12   Period Weeks   Status Achieved   PT LONG TERM GOAL #6   Title Patient will increase 6 minute walk test by >300 ft to indicate improved function and increased access to the community. by 08/31/15   Time 8   Period Weeks   Status Achieved               Plan - 2015-09-23 1346    Clinical Impression Statement Patient demonstrates significant improvement in balance and gait ability. PT instructed patient in outcome measures to assess progress towards goals. Patient was able to walk at a community ambulator speed without AD and without AFOs. She tested as a low fall risk today. PT recommends discharge from PT at this time as patient as met all goals. Patient is agareeable.   Pt will benefit from skilled therapeutic intervention in order to improve on the following deficits Decreased activity tolerance;Decreased balance;Decreased coordination;Decreased endurance;Decreased knowledge of precautions;Decreased knowledge of use of DME;Decreased mobility;Decreased range of motion;Decreased strength;Difficulty walking;Increased muscle spasms;Impaired perceived functional ability;Impaired flexibility;Impaired sensation;Improper body mechanics;Postural dysfunction;Obesity   Rehab Potential Good   Clinical Impairments Affecting Rehab  Potential Positive: good family support, high PLOF. Negative: recent surgery, decreased sensation    PT Frequency 2x / week   PT Duration 12 weeks   PT Treatment/Interventions ADLs/Self Care Home Management;Cryotherapy;Electrical Stimulation;Moist Heat;DME Instruction;Gait training;Stair training;Functional mobility training;Therapeutic activities;Therapeutic exercise;Balance training;Neuromuscular re-education;Patient/family education;Orthotic Fit/Training;Wheelchair mobility training;Manual techniques;Passive range of motion;Energy conservation   PT Next Visit Plan discharge from [facility] continue as given    Consulted and Agree with Plan of Care Patient          G-Codes - 09/23/2015 1347    Functional Assessment Tool Used LEFS, 44mwalk, MMT,Berg    Functional Limitation Mobility: Walking and moving around   Mobility: Walking and Moving Around Goal Status ([phone] At least 20 percent but less than 40 percent impaired, limited or restricted   Mobility: Walking and Moving Around Discharge Status ([id number] At least 1 percent but less than 20 percent impaired, limited or restricted      Problem List There are no active problems to display for this patient.   Hopkins,Amber Bridges PT, DPT 12016-12-16 1:48 PM  [place] [facility] [address] [place] [address]Phone: [phone]  Fax:  [phone] Name: PNavdeep FessendenMRN: [id number] of Birth: 413-Dec-1942

## 2015-10-09 HISTORY — PX: CATARACT EXTRACTION W/ INTRAOCULAR LENS IMPLANT: SHX1309

## 2016-01-05 DIAGNOSIS — R7303 Prediabetes: Secondary | ICD-10-CM | POA: Insufficient documentation

## 2016-02-05 DIAGNOSIS — Z96651 Presence of right artificial knee joint: Secondary | ICD-10-CM | POA: Insufficient documentation

## 2016-03-20 ENCOUNTER — Emergency Department: Payer: Medicare Other

## 2016-03-20 ENCOUNTER — Emergency Department
Admission: EM | Admit: 2016-03-20 | Discharge: 2016-03-20 | Disposition: A | Discharge status: Home / Self Care | Payer: Medicare Other | Attending: Emergency Medicine | Admitting: Emergency Medicine

## 2016-03-20 DIAGNOSIS — Z7982 Long term (current) use of aspirin: Secondary | ICD-10-CM | POA: Diagnosis not present

## 2016-03-20 DIAGNOSIS — S99922A Unspecified injury of left foot, initial encounter: Secondary | ICD-10-CM | POA: Insufficient documentation

## 2016-03-20 DIAGNOSIS — M545 Low back pain, unspecified: Secondary | ICD-10-CM

## 2016-03-20 DIAGNOSIS — Y929 Unspecified place or not applicable: Secondary | ICD-10-CM | POA: Diagnosis not present

## 2016-03-20 DIAGNOSIS — I10 Essential (primary) hypertension: Secondary | ICD-10-CM | POA: Insufficient documentation

## 2016-03-20 DIAGNOSIS — F329 Major depressive disorder, single episode, unspecified: Secondary | ICD-10-CM | POA: Diagnosis not present

## 2016-03-20 DIAGNOSIS — M79605 Pain in left leg: Secondary | ICD-10-CM | POA: Insufficient documentation

## 2016-03-20 DIAGNOSIS — Z87891 Personal history of nicotine dependence: Secondary | ICD-10-CM | POA: Insufficient documentation

## 2016-03-20 DIAGNOSIS — W230XXA Caught, crushed, jammed, or pinched between moving objects, initial encounter: Secondary | ICD-10-CM | POA: Insufficient documentation

## 2016-03-20 DIAGNOSIS — Y999 Unspecified external cause status: Secondary | ICD-10-CM | POA: Diagnosis not present

## 2016-03-20 DIAGNOSIS — M79672 Pain in left foot: Secondary | ICD-10-CM | POA: Diagnosis present

## 2016-03-20 DIAGNOSIS — S9782XA Crushing injury of left foot, initial encounter: Secondary | ICD-10-CM

## 2016-03-20 DIAGNOSIS — Y9389 Activity, other specified: Secondary | ICD-10-CM | POA: Diagnosis not present

## 2016-03-20 DIAGNOSIS — M25572 Pain in left ankle and joints of left foot: Secondary | ICD-10-CM

## 2016-03-20 DIAGNOSIS — Z79899 Other long term (current) drug therapy: Secondary | ICD-10-CM | POA: Insufficient documentation

## 2016-03-20 MED ORDER — OXYCODONE-ACETAMINOPHEN 5-325 MG PO TABS: ORAL_TABLET | ORAL | Status: DC

## 2016-03-20 MED ORDER — OXYCODONE-ACETAMINOPHEN 5-325 MG PO TABS
1.0000 | ORAL_TABLET | Freq: Once | ORAL | Status: AC
  Administered 2016-03-20: 1 via ORAL
  Filled 2016-03-20: qty 1
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
## 2016-03-20 NOTE — Discharge Instructions (Signed)
Cryotherapy Cryotherapy is when you put ice on your injury. Ice helps lessen pain and puffiness (swelling) after an injury. Ice works the best when you start using it in the first 24 to 48 hours after an injury. HOME CARE  Put a dry or damp towel between the ice pack and your skin.  You may press gently on the ice pack.  Leave the ice on for no more than 10 to 20 minutes at a time.  Check your skin after 5 minutes to make sure your skin is okay.  Rest at least 20 minutes between ice pack uses.  Stop using ice when your skin loses feeling (numbness).  Do not use ice on someone who cannot tell you when it hurts. This includes small children and people with memory problems (dementia). GET HELP RIGHT AWAY IF:  You have white spots on your skin.  Your skin turns blue or pale.  Your skin feels waxy or hard.  Your puffiness gets worse. MAKE SURE YOU:   Understand these instructions.  Will watch your condition.  Will get help right away if you are not doing well or get worse.   This information is not intended to replace advice given to you by your health care provider. Make sure you discuss any questions you have with your health care provider.   Document Released: 03/12/2008 Document Revised: 12/17/2011 Document Reviewed: 05/17/2011 Elsevier Interactive Patient Education 2016 [place] and elevate if needed for pain and swelling. Percocet as needed for severe pain. Did not take any other pain medication with this medication. The aware of this medication could cause drowsiness and increase her risk for falling. Follow-up with your primary doctor if any continued problems. Tomorrow you can expect more stiffness and soreness then your experiencing now.
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
## 2016-08-07 ENCOUNTER — Encounter: Payer: Self-pay | Admitting: *Deleted

## 2016-08-07 NOTE — H&P (Signed)
See scanned note.

## 2016-08-08 ENCOUNTER — Ambulatory Visit: Payer: Medicare Other | Admitting: Anesthesiology

## 2016-08-08 ENCOUNTER — Ambulatory Visit
Admission: RE | Admit: 2016-08-08 | Discharge: 2016-08-08 | Disposition: A | Discharge status: Home / Self Care | Payer: Medicare Other | Source: Ambulatory Visit | Attending: Ophthalmology | Admitting: Ophthalmology

## 2016-08-08 ENCOUNTER — Encounter: Payer: Self-pay | Admitting: *Deleted

## 2016-08-08 ENCOUNTER — Encounter
Admission: RE | Disposition: A | Discharge status: Home / Self Care | Payer: Self-pay | Source: Ambulatory Visit | Attending: Ophthalmology

## 2016-08-08 DIAGNOSIS — Z96651 Presence of right artificial knee joint: Secondary | ICD-10-CM | POA: Insufficient documentation

## 2016-08-08 DIAGNOSIS — F329 Major depressive disorder, single episode, unspecified: Secondary | ICD-10-CM | POA: Insufficient documentation

## 2016-08-08 DIAGNOSIS — H2512 Age-related nuclear cataract, left eye: Secondary | ICD-10-CM | POA: Insufficient documentation

## 2016-08-08 DIAGNOSIS — Z87891 Personal history of nicotine dependence: Secondary | ICD-10-CM | POA: Diagnosis not present

## 2016-08-08 DIAGNOSIS — Z6836 Body mass index (BMI) 36.0-36.9, adult: Secondary | ICD-10-CM | POA: Diagnosis not present

## 2016-08-08 DIAGNOSIS — Z79899 Other long term (current) drug therapy: Secondary | ICD-10-CM | POA: Diagnosis not present

## 2016-08-08 DIAGNOSIS — I10 Essential (primary) hypertension: Secondary | ICD-10-CM | POA: Diagnosis not present

## 2016-08-08 DIAGNOSIS — I2510 Atherosclerotic heart disease of native coronary artery without angina pectoris: Secondary | ICD-10-CM | POA: Insufficient documentation

## 2016-08-08 DIAGNOSIS — E7800 Pure hypercholesterolemia, unspecified: Secondary | ICD-10-CM | POA: Diagnosis not present

## 2016-08-08 DIAGNOSIS — G4730 Sleep apnea, unspecified: Secondary | ICD-10-CM | POA: Insufficient documentation

## 2016-08-08 HISTORY — DX: Sleep apnea, unspecified: G47.30

## 2016-08-08 HISTORY — DX: Unspecified osteoarthritis, unspecified site: M19.90

## 2016-08-08 HISTORY — DX: Unspecified hearing loss, unspecified ear: H91.90

## 2016-08-08 HISTORY — PX: CATARACT EXTRACTION W/PHACO: SHX586

## 2016-08-08 HISTORY — DX: Atherosclerotic heart disease of native coronary artery without angina pectoris: I25.10

## 2016-08-08 SURGERY — PHACOEMULSIFICATION, CATARACT, WITH IOL INSERTION
Anesthesia: Monitor Anesthesia Care | Laterality: Left | Wound class: Clean

## 2016-08-08 MED ORDER — LIDOCAINE HCL (PF) 4 % IJ SOLN
INTRAMUSCULAR | Status: AC
  Filled 2016-08-08: qty 5

## 2016-08-08 MED ORDER — CEFUROXIME OPHTHALMIC INJECTION 1 MG/0.1 ML
INJECTION | OPHTHALMIC | Status: AC
Start: 2016-08-08 — End: 2016-08-08
  Filled 2016-08-08: qty 0.1

## 2016-08-08 MED ORDER — CARBACHOL 0.01 % IO SOLN
INTRAOCULAR | Status: DC | PRN
  Administered 2016-08-08: 0.5 mL via INTRAOCULAR

## 2016-08-08 MED ORDER — CYCLOPENTOLATE HCL 2 % OP SOLN
1.0000 [drp] | OPHTHALMIC | Status: AC | PRN
  Administered 2016-08-08 (×4): 1 [drp] via OPHTHALMIC

## 2016-08-08 MED ORDER — BUPIVACAINE HCL (PF) 0.75 % IJ SOLN
INTRAMUSCULAR | Status: AC
  Filled 2016-08-08: qty 10

## 2016-08-08 MED ORDER — TETRACAINE HCL 0.5 % OP SOLN
OPHTHALMIC | Status: DC | PRN
  Administered 2016-08-08: 2 [drp] via OPHTHALMIC

## 2016-08-08 MED ORDER — TETRACAINE HCL 0.5 % OP SOLN
OPHTHALMIC | Status: AC
  Filled 2016-08-08: qty 2

## 2016-08-08 MED ORDER — ALFENTANIL 500 MCG/ML IJ INJ
INJECTION | INTRAMUSCULAR | Status: DC | PRN
Start: 2016-08-08 — End: 2016-08-08
  Administered 2016-08-08 (×2): 250 ug via INTRAVENOUS
  Administered 2016-08-08: 500 ug via INTRAVENOUS

## 2016-08-08 MED ORDER — SODIUM CHLORIDE 0.9 % IV SOLN
INTRAVENOUS | Status: DC
  Administered 2016-08-08 (×2): via INTRAVENOUS

## 2016-08-08 MED ORDER — LIDOCAINE HCL (PF) 4 % IJ SOLN
INTRAMUSCULAR | Status: DC | PRN
  Administered 2016-08-08: 6 mL

## 2016-08-08 MED ORDER — POVIDONE-IODINE 5 % OP SOLN
OPHTHALMIC | Status: DC | PRN
  Administered 2016-08-08: 1 via OPHTHALMIC

## 2016-08-08 MED ORDER — POVIDONE-IODINE 5 % OP SOLN
OPHTHALMIC | Status: AC
  Filled 2016-08-08: qty 30

## 2016-08-08 MED ORDER — NA CHONDROIT SULF-NA HYALURON 40-17 MG/ML IO SOLN
INTRAOCULAR | Status: DC | PRN
  Administered 2016-08-08: 1 mL via INTRAOCULAR

## 2016-08-08 MED ORDER — PHENYLEPHRINE HCL 10 % OP SOLN
1.0000 [drp] | OPHTHALMIC | Status: AC | PRN
  Administered 2016-08-08 (×4): 1 [drp] via OPHTHALMIC

## 2016-08-08 MED ORDER — PROPOFOL 10 MG/ML IV BOLUS
INTRAVENOUS | Status: DC | PRN
  Administered 2016-08-08: 20 mg via INTRAVENOUS
  Administered 2016-08-08: 10 mg via INTRAVENOUS

## 2016-08-08 MED ORDER — CYCLOPENTOLATE HCL 2 % OP SOLN
OPHTHALMIC | Status: AC
  Filled 2016-08-08: qty 2

## 2016-08-08 MED ORDER — EPINEPHRINE PF 1 MG/ML IJ SOLN
INTRAMUSCULAR | Status: AC
  Filled 2016-08-08: qty 2

## 2016-08-08 MED ORDER — HYALURONIDASE HUMAN 150 UNIT/ML IJ SOLN
INTRAMUSCULAR | Status: AC
  Filled 2016-08-08: qty 1

## 2016-08-08 MED ORDER — MIDAZOLAM HCL 2 MG/2ML IJ SOLN
INTRAMUSCULAR | Status: DC | PRN
Start: 2016-08-08 — End: 2016-08-08
  Administered 2016-08-08: 1 mg via INTRAVENOUS

## 2016-08-08 MED ORDER — MOXIFLOXACIN HCL 0.5 % OP SOLN
OPHTHALMIC | Status: AC
  Filled 2016-08-08: qty 3

## 2016-08-08 MED ORDER — EPINEPHRINE PF 1 MG/ML IJ SOLN
INTRAOCULAR | Status: DC | PRN
  Administered 2016-08-08: 250 mL via OPHTHALMIC

## 2016-08-08 MED ORDER — LIDOCAINE HCL (PF) 4 % IJ SOLN
INTRAOCULAR | Status: DC | PRN
  Administered 2016-08-08: .5 mL via OPHTHALMIC

## 2016-08-08 MED ORDER — MOXIFLOXACIN HCL 0.5 % OP SOLN
OPHTHALMIC | Status: DC | PRN
  Administered 2016-08-08: 2 [drp] via OPHTHALMIC

## 2016-08-08 MED ORDER — MOXIFLOXACIN HCL 0.5 % OP SOLN
1.0000 [drp] | OPHTHALMIC | Status: AC | PRN
  Administered 2016-08-08 (×3): 1 [drp] via OPHTHALMIC

## 2016-08-08 MED ORDER — PHENYLEPHRINE HCL 10 % OP SOLN
OPHTHALMIC | Status: AC
  Filled 2016-08-08: qty 5

## 2016-08-08 MED ORDER — NA CHONDROIT SULF-NA HYALURON 40-17 MG/ML IO SOLN
INTRAOCULAR | Status: AC
  Filled 2016-08-08: qty 1

## 2016-08-08 SURGICAL SUPPLY — 29 items
CANNULA ANT/CHMB 27GA (MISCELLANEOUS) ×2 IMPLANT
CORD BIP STRL DISP 12FT (MISCELLANEOUS) ×2 IMPLANT
CUP MEDICINE 2OZ PLAST GRAD ST (MISCELLANEOUS) ×2 IMPLANT
DRAPE XRAY CASSETTE 23X24 (DRAPES) ×2 IMPLANT
ERASER HMR WETFIELD 18G (MISCELLANEOUS) ×2 IMPLANT
GLOVE BIO SURGEON STRL SZ8 (GLOVE) ×2 IMPLANT
GLOVE SURG LX 6.5 MICRO (GLOVE) ×1
GLOVE SURG LX 8.0 MICRO (GLOVE) ×1
GLOVE SURG LX STRL 6.5 MICRO (GLOVE) ×1 IMPLANT
GLOVE SURG LX STRL 8.0 MICRO (GLOVE) ×1 IMPLANT
GOWN STRL REUS W/ TWL LRG LVL3 (GOWN DISPOSABLE) ×1 IMPLANT
GOWN STRL REUS W/ TWL XL LVL3 (GOWN DISPOSABLE) ×1 IMPLANT
GOWN STRL REUS W/TWL LRG LVL3 (GOWN DISPOSABLE) ×1
GOWN STRL REUS W/TWL XL LVL3 (GOWN DISPOSABLE) ×1
LENS IOL ACRYSOF IQ 19.5 (Intraocular Lens) ×2 IMPLANT
PACK CATARACT (MISCELLANEOUS) ×2 IMPLANT
PACK CATARACT DINGLEDEIN LX (MISCELLANEOUS) ×2 IMPLANT
PACK EYE AFTER SURG (MISCELLANEOUS) ×2 IMPLANT
SHLD EYE VISITEC  UNIV (MISCELLANEOUS) IMPLANT
SOL BSS BAG (MISCELLANEOUS) ×2
SOL PREP PVP 2OZ (MISCELLANEOUS)
SOLUTION BSS BAG (MISCELLANEOUS) ×1 IMPLANT
SOLUTION PREP PVP 2OZ (MISCELLANEOUS) IMPLANT
SUT SILK 5-0 (SUTURE) ×2 IMPLANT
SYR 3ML LL SCALE MARK (SYRINGE) ×2 IMPLANT
SYR 5ML LL (SYRINGE) ×2 IMPLANT
SYR TB 1ML 27GX1/2 LL (SYRINGE) ×2 IMPLANT
WATER STERILE IRR 250ML POUR (IV SOLUTION) ×2 IMPLANT
WIPE NON LINTING 3.25X3.25 (MISCELLANEOUS) ×2 IMPLANT

## 2016-08-08 NOTE — Anesthesia Preprocedure Evaluation (Signed)
Anesthesia Evaluation  Patient identified by MRN, date of birth, ID band Patient awake    Reviewed: Allergy & Precautions, NPO status , Patient's Chart, lab work & pertinent test results  Airway Mallampati: III       Dental  (+) Teeth Intact, Partial Upper   Pulmonary sleep apnea and Continuous Positive Airway Pressure Ventilation , former smoker,     + decreased breath sounds      Cardiovascular Exercise Tolerance: Good hypertension, Pt. on medications + CAD   Rhythm:Regular     Neuro/Psych Depression    GI/Hepatic negative GI ROS, Neg liver ROS,   Endo/Other  Morbid obesity  Renal/GU negative Renal ROS     Musculoskeletal   Abdominal (+) + obese,   Peds  Hematology negative hematology ROS (+)   Anesthesia Other Findings   Reproductive/Obstetrics                             Anesthesia Physical Anesthesia Plan  ASA: III  Anesthesia Plan: MAC   Post-op Pain Management:    Induction: Intravenous  Airway Management Planned: Natural Airway and Nasal Cannula  Additional Equipment:   Intra-op Plan:   Post-operative Plan:   Informed Consent: I have reviewed the patients History and Physical, chart, labs and discussed the procedure including the risks, benefits and alternatives for the proposed anesthesia with the patient or authorized representative who has indicated his/her understanding and acceptance.     Plan Discussed with: CRNA  Anesthesia Plan Comments:         Anesthesia Quick Evaluation

## 2016-08-08 NOTE — OR Nursing (Signed)
Belongings taken to postop nursing desk, son left without taking

## 2016-08-08 NOTE — Op Note (Signed)
Date of Surgery: 08/08/2016 Date of Dictation: 08/08/2016 10:56 AM Pre-operative Diagnosis:  Nuclear Sclerotic Cataract Left Eye Post-operative Diagnosis: same Procedure performed: Extra-capsular Cataract Extraction (ECCE) with placement of a posterior chamber intraocular lens (IOL) left Eye IOL:  Implant Name Type Inv. Item Serial No. Manufacturer Lot No. LRB No. Used  LENS IOL ACRYSOF IQ 19.5 - [id number] Intraocular Lens LENS IOL ACRYSOF IQ 19.5 [id number] ALCON [id number] Left 1   Anesthesia: 2% Lidocaine and 4% Marcaine in a 50/50 mixture with 10 unites/ml of Hylenex given as a peribulbar Anesthesiologist: Anesthesiologist: Iver Nestle, MD CRNA: Courtney Paris, CRNA Complications: none Estimated Blood Loss: less than 1 ml  Description of procedure:  The patient was given anesthesia and sedation via intravenous access. The patient was then prepped and draped in the usual fashion. A 25-gauge needle was bent for initiating the capsulorhexis. A 5-0 silk suture was placed through the conjunctiva superior and inferiorly to serve as bridle sutures. Hemostasis was obtained at the superior limbus using an eraser cautery. A partial thickness groove was made at the anterior surgical limbus with a 64 Beaver blade and this was dissected anteriorly with an Avaya. The anterior chamber was entered at 10 o'clock with a 1.0 mm paracentesis knife and through the lamellar dissection with a 2.6 mm Alcon keratome. Epi-Shugarcaine 0.5 CC [9 cc BSS Plus (Alcon), 3 cc 4% preservative-free lidocaine (Hospira) and 4 cc 1:1000 preservative-free, bisulfite-free epinephrine] was injected into the anterior chamber via the paracentesis tract. Epi-Shugarcaine 0.5 CC [9 cc BSS Plus (Alcon), 3 cc 4% preservative-free lidocaine (Hospira) and 4 cc 1:1000 preservative-free, bisulfite-free epinephrine] was injected into the anterior chamber via the paracentesis tract. DiscoVisc was injected to  replace the aqueous and a continuous tear curvilinear capsulorhexis was performed using a bent 25-gauge needle.  Balance salt on a syringe was used to perform hydro-dissection and phacoemulsification was carried out using a divide and conquer technique. Procedure(s) with comments: CATARACT EXTRACTION PHACO AND INTRAOCULAR LENS PLACEMENT ([facility]) (Left) - fluid lot # [id number]  exp02/28/2019 [place]    01:15.3 AP%   25.2 CDE   35.06 . Irrigation/aspiration was used to remove the residual cortex and the capsular bag was inflated with DiscoVisc. The intraocular lens was inserted into the capsular bag using a pre-loaded UltraSert Delivery System. Irrigation/aspiration was used to remove the residual DiscoVisc. The wound was inflated with balanced salt and checked for leaks. None were found. Miostat was injected via the paracentesis track and 0.1 ml of Vigamox containing 1 mg of drug  was injected via the paracentesis track. The wound was checked for leaks again and none were found.   The bridal sutures were removed and two drops of Vigamox were placed on the eye. An eye shield was placed to protect the eye and the patient was discharged to the recovery area in good condition.   Tziporah Knoke MD

## 2016-08-08 NOTE — Discharge Instructions (Addendum)
Eye Surgery Discharge Instructions  Expect mild scratchy sensation or mild soreness. DO NOT RUB YOUR EYE!  The day of surgery:  Minimal physical activity, but bed rest is not required  No reading, computer work, or close hand work  No bending, lifting, or straining.  May watch TV  For 24 hours:  No driving, legal decisions, or alcoholic beverages  Safety precautions  Eat anything you prefer: It is better to start with liquids, then soup then solid foods.  _____ Eye patch should be worn until postoperative exam tomorrow.  ____ Solar shield eyeglasses should be worn for comfort in the sunlight/patch while sleeping  Resume all regular medications including aspirin or Coumadin if these were discontinued prior to surgery. You may shower, bathe, shave, or wash your hair. Tylenol may be taken for mild discomfort.  Call your doctor if you experience significant pain, nausea, or vomiting, fever > 101 or other signs of infection. [phone] or [phone] Specific instructions:  Follow-up Information    DINGELDEIN,STEVEN, MD .   Specialty:  Ophthalmology Why:  08-09-16 at 10:45 Contact information: [place] [address] [phone]          Eye Surgery Discharge Instructions  Expect mild scratchy sensation or mild soreness. DO NOT RUB YOUR EYE!  The day of surgery:  Minimal physical activity, but bed rest is not required  No reading, computer work, or close hand work  No bending, lifting, or straining.  May watch TV  For 24 hours:  No driving, legal decisions, or alcoholic beverages  Safety precautions  Eat anything you prefer: It is better to start with liquids, then soup then solid foods.  _____ Eye patch should be worn until postoperative exam tomorrow.  ____ Solar shield eyeglasses should be worn for comfort in the sunlight/patch while sleeping  Resume all regular medications including aspirin or Coumadin if these were  discontinued prior to surgery. You may shower, bathe, shave, or wash your hair. Tylenol may be taken for mild discomfort.  Call your doctor if you experience significant pain, nausea, or vomiting, fever > 101 or other signs of infection. [phone] or [phone] Specific instructions:  Follow-up Information    DINGELDEIN,STEVEN, MD .   Specialty:  Ophthalmology Why:  08-09-16 at 10:45 Contact information: [address]   [place] [address] [phone]         See handout.

## 2016-08-08 NOTE — Interval H&P Note (Signed)
History and Physical Interval Note:  08/08/2016 10:15 AM  [name]  has presented today for surgery, with the diagnosis of cataract  The various methods of treatment have been discussed with the patient and family. After consideration of risks, benefits and other options for treatment, the patient has consented to  Procedure(s): CATARACT EXTRACTION PHACO AND INTRAOCULAR LENS PLACEMENT ([name]) (Left) as a surgical intervention .  The patient's history has been reviewed, patient examined, no change in status, stable for surgery.  I have reviewed the patient's chart and labs.  Questions were answered to the patient's satisfaction.     Amber Bridges

## 2016-08-08 NOTE — Transfer of Care (Signed)
Immediate Anesthesia Transfer of Care Note  Patient: Amber Bridges  Procedure(s) Performed: Procedure(s) with comments: CATARACT EXTRACTION PHACO AND INTRAOCULAR LENS PLACEMENT (I[facility]) (Left) - fluid lot # [id number]  exp02/28/2019 [place]    01:15.3 AP%   25.2 CDE   35.06   Patient Location: PACU and Short Stay  Anesthesia Type:MAC  Level of Consciousness: awake, oriented and patient cooperative  Airway & Oxygen Therapy: Patient Spontanous Breathing  Post-op Assessment: Report given to RN and Post -op Vital signs reviewed and stable  Post vital signs: Reviewed and stable  Last Vitals:  Vitals:   08/08/16 0832  BP: (!) 150/69  Pulse: (!) 55  Resp: 16  Temp: 36.6 C    Last Pain:  Vitals:   08/08/16 0832  TempSrc: Oral         Complications: No apparent anesthesia complications

## 2016-08-08 NOTE — Anesthesia Postprocedure Evaluation (Signed)
Anesthesia Post Note  Patient: Amber Bridges  Procedure(s) Performed: Procedure(s) (LRB): CATARACT EXTRACTION PHACO AND INTRAOCULAR LENS PLACEMENT ([facility]) (Left)  Patient location during evaluation: PACU Anesthesia Type: MAC Level of consciousness: awake Pain management: pain level controlled Vital Signs Assessment: post-procedure vital signs reviewed and stable Respiratory status: spontaneous breathing Cardiovascular status: stable Anesthetic complications: no    Last Vitals:  Vitals:   08/08/16 1102 08/08/16 1106  BP:  (!) 159/56  Pulse: (!) 54 (!) 53  Resp: 16   Temp: 36.4 C     Last Pain:  Vitals:   08/08/16 1059  TempSrc: Temporal                 VAN STAVEREN,Adisynn Suleiman

## 2017-03-07 ENCOUNTER — Other Ambulatory Visit (INDEPENDENT_AMBULATORY_CARE_PROVIDER_SITE_OTHER): Payer: Medicare Other

## 2017-03-07 ENCOUNTER — Ambulatory Visit (INDEPENDENT_AMBULATORY_CARE_PROVIDER_SITE_OTHER): Payer: Self-pay | Admitting: Vascular Surgery

## 2017-03-07 ENCOUNTER — Other Ambulatory Visit (INDEPENDENT_AMBULATORY_CARE_PROVIDER_SITE_OTHER): Payer: Self-pay | Admitting: Vascular Surgery

## 2017-03-07 DIAGNOSIS — I6523 Occlusion and stenosis of bilateral carotid arteries: Secondary | ICD-10-CM

## 2017-03-07 LAB — VAS US CAROTID
LCCADSYS: 64 cm/s
LCCAPDIAS: 12 cm/s
LCCAPSYS: 89 cm/s
LEFT ECA DIAS: -8 cm/s
LICADDIAS: -12 cm/s
LICAPDIAS: -18 cm/s
Left CCA dist dias: 12 cm/s
Left ICA dist sys: -74 cm/s
Left ICA prox sys: -59 cm/s
RIGHT CCA MID DIAS: 10 cm/s
RIGHT ECA DIAS: -9 cm/s
Right CCA prox dias: 7 cm/s
Right CCA prox sys: 65 cm/s
Right cca dist sys: -72 cm/s

## 2017-03-21 ENCOUNTER — Encounter (INDEPENDENT_AMBULATORY_CARE_PROVIDER_SITE_OTHER): Payer: Self-pay | Admitting: Vascular Surgery
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
## 2018-03-06 ENCOUNTER — Encounter (INDEPENDENT_AMBULATORY_CARE_PROVIDER_SITE_OTHER): Payer: Medicare Other

## 2018-03-06 ENCOUNTER — Ambulatory Visit (INDEPENDENT_AMBULATORY_CARE_PROVIDER_SITE_OTHER): Payer: Medicare Other | Admitting: Vascular Surgery

## 2018-03-10 ENCOUNTER — Ambulatory Visit (INDEPENDENT_AMBULATORY_CARE_PROVIDER_SITE_OTHER): Payer: Medicare Other | Admitting: Vascular Surgery

## 2018-03-10 ENCOUNTER — Encounter (INDEPENDENT_AMBULATORY_CARE_PROVIDER_SITE_OTHER): Payer: Self-pay | Admitting: Vascular Surgery

## 2018-03-10 ENCOUNTER — Ambulatory Visit (INDEPENDENT_AMBULATORY_CARE_PROVIDER_SITE_OTHER): Payer: Medicare Other

## 2018-03-10 ENCOUNTER — Other Ambulatory Visit (INDEPENDENT_AMBULATORY_CARE_PROVIDER_SITE_OTHER): Payer: Self-pay | Admitting: Vascular Surgery

## 2018-03-10 VITALS — BP 146/66 | HR 53 | Resp 16 | Ht 67.5 in | Wt 237.0 lb

## 2018-03-10 DIAGNOSIS — I6523 Occlusion and stenosis of bilateral carotid arteries: Secondary | ICD-10-CM | POA: Insufficient documentation

## 2018-03-10 DIAGNOSIS — I779 Disorder of arteries and arterioles, unspecified: Secondary | ICD-10-CM | POA: Insufficient documentation

## 2018-03-10 DIAGNOSIS — I10 Essential (primary) hypertension: Secondary | ICD-10-CM | POA: Diagnosis not present

## 2018-03-10 DIAGNOSIS — E785 Hyperlipidemia, unspecified: Secondary | ICD-10-CM | POA: Diagnosis not present

## 2018-03-10 NOTE — Progress Notes (Signed)
Subjective:    Patient ID: Amber Bridges, female    DOB: 29-Jul-1941, 77 y.o.   MRN: [id number] Chief Complaint  Patient presents with  . Follow-up    9yr carotid   Patient presents for a yearly non-invasive study follow up for carotid stenosis. The stenosis has been followed by surveillance duplexes. The patient underwent a bilateral carotid duplex scan which showed no change from the previous exam on 03/07/17. Duplex is stable at Right ICA stenosis (40-59%) and Left ICA stenosis (1-39%). The patient denies experiencing Amaurosis Fugax, TIA like symptoms or focal motor deficits.  The patient denies any fever, nausea vomiting.  Review of Systems  Constitutional: Negative.   HENT: Negative.   Eyes: Negative.   Respiratory: Negative.   Cardiovascular: Negative.   Gastrointestinal: Negative.   Endocrine: Negative.   Genitourinary: Negative.   Musculoskeletal: Negative.   Skin: Negative.   Allergic/Immunologic: Negative.   Neurological: Negative.   Hematological: Negative.   Psychiatric/Behavioral: Negative.       Objective:   Physical Exam  Constitutional: She is oriented to person, place, and time. She appears well-developed and well-nourished. No distress.  HENT:  Head: Normocephalic and atraumatic.  Right Ear: External ear normal.  Left Ear: External ear normal.  Eyes: Pupils are equal, round, and reactive to light. Conjunctivae and EOM are normal.  Neck: Normal range of motion.  No carotid bruits auscultated on exam  Cardiovascular: Normal rate, regular rhythm, normal heart sounds and intact distal pulses.  Pulses:      Radial pulses are 2+ on the right side, and 2+ on the left side.  Pulmonary/Chest: Effort normal and breath sounds normal.  Musculoskeletal: Normal range of motion. She exhibits no edema.  Neurological: She is alert and oriented to person, place, and time.  Skin: Skin is warm and dry. She is not diaphoretic.  Psychiatric: She has a normal mood and  affect. Her behavior is normal. Judgment and thought content normal.  Vitals reviewed.  BP (!) 146/66 (BP Location: Left Arm)   Pulse (!) 53   Resp 16   Ht 5' 7.5" (1.715 m)   Wt 237 lb (107.5 kg)   BMI 36.57 kg/m   Past Medical History:  Diagnosis Date  . Arthritis   . Bronchitis   . Coronary artery disease   . Depression   . HOH (hard of hearing)    AIDS  . Hypertension   . Sleep apnea    CPAP   Social History   Socioeconomic History  . Marital status: Married    Spouse name: Not on file  . Number of children: Not on file  . Years of education: Not on file  . Highest education level: Not on file  Occupational History  . Not on file  Social Needs  . Financial resource strain: Not on file  . Food insecurity:    Worry: Not on file    Inability: Not on file  . Transportation needs:    Medical: Not on file    Non-medical: Not on file  Tobacco Use  . Smoking status: Former Smoker    Last attempt to quit: 10/08/1960    Years since quitting: 57.4  . Smokeless tobacco: Never Used  Substance and Sexual Activity  . Alcohol use: Yes    Comment: OCCAS  . Drug use: Never  . Sexual activity: Not on file  Lifestyle  . Physical activity:    Days per week: Not on file  Minutes per session: Not on file  . Stress: Not on file  Relationships  . Social connections:    Talks on phone: Not on file    Gets together: Not on file    Attends religious service: Not on file    Active member of club or organization: Not on file    Attends meetings of clubs or organizations: Not on file    Relationship status: Not on file  . Intimate partner violence:    Fear of current or ex partner: Not on file    Emotionally abused: Not on file    Physically abused: Not on file    Forced sexual activity: Not on file  Other Topics Concern  . Not on file  Social History Narrative  . Not on file   Past Surgical History:  Procedure Laterality Date  . ABDOMINAL HYSTERECTOMY    . BACK  SURGERY     X 3  . CATARACT EXTRACTION W/PHACO Left 08/08/2016   Procedure: CATARACT EXTRACTION PHACO AND INTRAOCULAR LENS PLACEMENT ([facility]);  Surgeon: Sallee Lange, MD;  Location: [facility] ORS;  Service: Ophthalmology;  Laterality: Left;  fluid lot # [id number]  exp02/28/2019 [place]    01:15.3 AP%   25.2 CDE   35.06   . CTR Right   . FOOT SURGERY    . JOINT REPLACEMENT     TKR  . NOSE SURGERY    . RCR    . SPINE SURGERY  1973, 1978, 2016   Most recent Laminectomy to T11 and L3   Family History  Problem Relation Age of Onset  . Congestive Heart Failure Mother   . Diabetes Father   . Congestive Heart Failure Father   . Congestive Heart Failure Sister   . Congestive Heart Failure Brother    Allergies  Allergen Reactions  . Penicillins Diarrhea    Has patient had a PCN reaction causing immediate rash, facial/tongue/throat swelling, SOB or lightheadedness with hypotension: No Has patient had a PCN reaction causing severe rash involving mucus membranes or skin necrosis: No Has patient had a PCN reaction that required hospitalization No Has patient had a PCN reaction occurring within the last 10 years: No If all of the above answers are "NO", then may proceed with Cephalosporin use.   . Egg Latanya Presser, Egg] Nausea Only  . 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 Lisinopril Cough      Assessment & Plan:  Patient presents for a yearly non-invasive study follow up for carotid stenosis. The stenosis has been followed by surveillance duplexes. The patient underwent a bilateral carotid duplex scan which showed no change from the previous exam on 03/07/17. Duplex is stable at Right ICA stenosis (40-59%) and Left ICA stenosis (1-39%). The patient denies experiencing Amaurosis Fugax, TIA like symptoms or focal motor deficits.  The patient denies any fever, nausea vomiting.  1. Bilateral carotid artery stenosis - Stable Studies reviewed with  patient. Patient asymptomatic with stable duplex. No intervention at this time. Patient to return in one year for surveillance carotid duplex. Patient to remain abstinent of tobacco use. I have discussed with the patient at length the risk factors for and pathogenesis of atherosclerotic disease and encouraged a healthy diet, regular exercise regimen and blood pressure / glucose control.  Patient was instructed to contact our office in the interim with problems such as arm / leg weakness or numbness, speech / swallowing difficulty or temporary  monocular blindness. The patient expresses their understanding.  2. Hyperlipidemia, unspecified hyperlipidemia type - Stable Encouraged good control as its slows the progression of atherosclerotic disease  3. Essential hypertension - Stable Encouraged good control as its slows the progression of atherosclerotic disease  Current Outpatient Medications on File Prior to Visit  Medication Sig Dispense Refill  . amLODipine (NORVASC) 5 MG tablet Take 1 tablet by mouth daily.    Marland Kitchen amoxicillin (AMOXIL) 500 MG capsule Take 2,000 mg by mouth daily as needed (dental procedure).    Marland Kitchen aspirin EC 81 MG tablet Take 1 tablet by mouth every evening.     Marland Kitchen atorvastatin (LIPITOR) 40 MG tablet Take 1 tablet by mouth at bedtime.    . Doxylamine Succinate, Sleep, (SLEEP AID PO) Take 1 tablet by mouth at bedtime as needed (sleep).    Marland Kitchen FLUoxetine (PROZAC) 40 MG capsule Take 1 capsule by mouth daily.    . fluticasone (FLONASE) 50 MCG/ACT nasal spray Place 1-2 sprays into both nostrils daily as needed for allergies or rhinitis.    . hydrochlorothiazide (HYDRODIURIL) 25 MG tablet Take 1 tablet by mouth every evening.     Marland Kitchen ibuprofen (ADVIL,MOTRIN) 200 MG tablet Take 400 mg by mouth every 6 (six) hours as needed for headache or moderate pain.    Marland Kitchen losartan (COZAAR) 50 MG tablet Take 1 tablet by mouth every evening.     . meloxicam (MOBIC) 7.5 MG tablet Take 7.5 mg by mouth 2  (two) times daily as needed for pain.    . Multiple Vitamins-Minerals (CENTRUM SILVER PO) Take 1 tablet by mouth daily.    . Polyethylene Glycol 3350 (MIRALAX PO) Take 17 g by mouth daily as needed (constipation).    . traZODone (DESYREL) 50 MG tablet Take 1 tablet by mouth at bedtime as needed for sleep.     Marland Kitchen oxyCODONE-acetaminophen (PERCOCET) 5-325 MG tablet Take 1 tablet every 4-6 hours prn pain (Patient not taking: Reported on 08/02/2016) 20 tablet 0   No current facility-administered medications on file prior to visit.    There are no Patient Instructions on file for this visit. No follow-ups on file.  Mahima Hottle A Dashton Czerwinski, PA-C

## 2018-08-03 DIAGNOSIS — M1712 Unilateral primary osteoarthritis, left knee: Secondary | ICD-10-CM | POA: Insufficient documentation

## 2018-09-08 DIAGNOSIS — F418 Other specified anxiety disorders: Secondary | ICD-10-CM | POA: Insufficient documentation

## 2018-10-15 DIAGNOSIS — H269 Unspecified cataract: Secondary | ICD-10-CM | POA: Insufficient documentation

## 2018-11-10 NOTE — Discharge Instructions (Signed)
°  Instructions after Total Knee Replacement ° ° Airica Schwartzkopf P. Jace Fermin, Jr., M.D.    ° Dept. of Orthopaedics & Sports Medicine ° [facility] ° [address] ° [place], [place]  [address] ° Phone: [phone]   Fax: [phone] ° °  °DIET: °• Drink plenty of non-alcoholic fluids. °• Resume your normal diet. Include foods high in fiber. ° °ACTIVITY:  °• You may use crutches or a walker with weight-bearing as tolerated, unless instructed otherwise. °• You may be weaned off of the walker or crutches by your Physical Therapist.  °• Do NOT place pillows under the knee. Anything placed under the knee could limit your ability to straighten the knee.   °• Continue doing gentle exercises. Exercising will reduce the pain and swelling, increase motion, and prevent muscle weakness.   °• Please continue to use the TED compression stockings for 6 weeks. You may remove the stockings at night, but should reapply them in the morning. °• Do not drive or operate any equipment until instructed. ° °WOUND CARE:  °• Continue to use the PolarCare or ice packs periodically to reduce pain and swelling. °• You may bathe or shower after the staples are removed at the first office visit following surgery. ° °MEDICATIONS: °• You may resume your regular medications. °• Please take the pain medication as prescribed on the medication. °• Do not take pain medication on an empty stomach. °• You have been given a prescription for a blood thinner (Lovenox or Coumadin). Please take the medication as instructed. (NOTE: After completing a 2 week course of Lovenox, take one Enteric-coated aspirin once a day. This along with elevation will help reduce the possibility of phlebitis in your operated leg.) °• Do not drive or drink alcoholic beverages when taking pain medications. ° °CALL THE OFFICE FOR: °• Temperature above 101 degrees °• Excessive bleeding or drainage on the dressing. °• Excessive swelling, coldness, or paleness of the toes. °• Persistent  nausea and vomiting. ° °FOLLOW-UP:  °• You should have an appointment to return to the office in 10-14 days after surgery. °• Arrangements have been made for continuation of Physical Therapy (either home therapy or outpatient therapy). °  °

## 2018-11-12 ENCOUNTER — Encounter
Admission: RE | Admit: 2018-11-12 | Discharge: 2018-11-12 | Disposition: A | Discharge status: Home / Self Care | Payer: Medicare Other | Source: Ambulatory Visit | Attending: Orthopedic Surgery | Admitting: Orthopedic Surgery

## 2018-11-12 ENCOUNTER — Other Ambulatory Visit: Payer: Self-pay

## 2018-11-12 DIAGNOSIS — Z01818 Encounter for other preprocedural examination: Secondary | ICD-10-CM | POA: Insufficient documentation

## 2018-11-12 DIAGNOSIS — I10 Essential (primary) hypertension: Secondary | ICD-10-CM | POA: Insufficient documentation

## 2018-11-12 LAB — URINALYSIS, ROUTINE W REFLEX MICROSCOPIC
BACTERIA UA: NONE SEEN
BILIRUBIN URINE: NEGATIVE
Glucose, UA: NEGATIVE mg/dL
Hgb urine dipstick: NEGATIVE
Ketones, ur: 5 mg/dL — AB
Nitrite: NEGATIVE
Protein, ur: NEGATIVE mg/dL
Specific Gravity, Urine: 1.023 (ref 1.005–1.030)
Squamous Epithelial / LPF: NONE SEEN (ref 0–5)
pH: 5 (ref 5.0–8.0)

## 2018-11-12 LAB — CBC
HCT: 41.6 % (ref 36.0–46.0)
Hemoglobin: 13.4 g/dL (ref 12.0–15.0)
MCH: 30.6 pg (ref 26.0–34.0)
MCHC: 32.2 g/dL (ref 30.0–36.0)
MCV: 95 fL (ref 80.0–100.0)
Platelets: 251 10*3/uL (ref 150–400)
RBC: 4.38 MIL/uL (ref 3.87–5.11)
RDW: 12.2 % (ref 11.5–15.5)
WBC: 5.9 10*3/uL (ref 4.0–10.5)
nRBC: 0 % (ref 0.0–0.2)

## 2018-11-12 LAB — COMPREHENSIVE METABOLIC PANEL
ALT: 20 U/L (ref 0–44)
ANION GAP: 10 (ref 5–15)
AST: 22 U/L (ref 15–41)
Albumin: 4.8 g/dL (ref 3.5–5.0)
Alkaline Phosphatase: 58 U/L (ref 38–126)
BUN: 22 mg/dL (ref 8–23)
CALCIUM: 9.3 mg/dL (ref 8.9–10.3)
CO2: 23 mmol/L (ref 22–32)
CREATININE: 1.27 mg/dL — AB (ref 0.44–1.00)
Chloride: 104 mmol/L (ref 98–111)
GFR calc non Af Amer: 41 mL/min — ABNORMAL LOW (ref >60)
GFR, EST AFRICAN AMERICAN: 47 mL/min — AB (ref >60)
Glucose, Bld: 105 mg/dL — ABNORMAL HIGH (ref 70–99)
Potassium: 4 mmol/L (ref 3.5–5.1)
Sodium: 137 mmol/L (ref 135–145)
Total Bilirubin: 1 mg/dL (ref 0.3–1.2)
Total Protein: 7.6 g/dL (ref 6.5–8.1)

## 2018-11-12 LAB — PROTIME-INR
INR: 0.96
Prothrombin Time: 12.7 seconds (ref 11.4–15.2)

## 2018-11-12 LAB — C-REACTIVE PROTEIN: CRP: <0.8 mg/dL (ref <1.0)

## 2018-11-12 LAB — SURGICAL PCR SCREEN
MRSA, PCR: NEGATIVE
Staphylococcus aureus: NEGATIVE

## 2018-11-12 LAB — APTT: APTT: 27 s (ref 24–36)

## 2018-11-12 LAB — SEDIMENTATION RATE: Sed Rate: 24 mm/hr (ref 0–30)

## 2018-11-12 NOTE — Pre-Procedure Instructions (Signed)
Abnormal labs- cmp and urinalysis faxed to Dr Clydell Hakim office

## 2018-11-12 NOTE — Patient Instructions (Signed)
Your procedure is scheduled on: November 24, 2018 [place] Report to Day Surgery on the 2nd floor of the [facility]. To find out your arrival time, please call [phone] between 1PM - 3PM on: Friday November 21, 2018  REMEMBER: Instructions that are not followed completely may result in serious medical risk, up to and including death; or upon the discretion of your surgeon and anesthesiologist your surgery may need to be rescheduled.  Do not eat food after midnight the night before surgery.  No gum chewing, lozengers or hard candies.  You may however, drink CLEAR liquids up to 2 hours before you are scheduled to arrive for your surgery. Do not drink anything within 2 hours of the start of your surgery.  Clear liquids include: - water  - apple juice without pulp -  CLEAR gatorade - black coffee or tea (Do NOT add milk or creamers to the coffee or tea) Do NOT drink anything that is not on this list.  Type 1 and Type 2 diabetics should only drink water.  No Alcohol for 24 hours before or after surgery.  No Smoking including e-cigarettes for 24 hours prior to surgery.  No chewable tobacco products for at least 6 hours prior to surgery.  No nicotine patches on the day of surgery.  On the morning of surgery brush your teeth with toothpaste and water, you may rinse your mouth with mouthwash if you wish. Do not swallow any toothpaste or mouthwash.  Notify your doctor if there is any change in your medical condition (cold, fever, infection).  Do not wear jewelry, make-up, hairpins, clips or nail polish.  Do not wear lotions, powders, or perfumes DEODORANT   Do not shave 48 hours prior to surgery.   Contacts and dentures may not be worn into surgery.  Do not bring valuables to the hospital, including drivers license, insurance or credit cards.  [facility] is not responsible for any belongings or valuables.   TAKE THESE MEDICATIONS THE MORNING OF  SURGERY: AMLODIPINE PROZAC  Use CHG Soap or wipes as directed on instruction sheet.   USE FLONASE  Bring your C-PAP to the hospital with you in case you may have to spend the night.    Follow recommendations from Cardiologist, Pulmonologist or PCP regarding stopping Aspirin, Coumadin, Plavix, Eliquis, Pradaxa, or Pletal.  Stop Anti-inflammatories (NSAIDS) such as Advil, Aleve, Ibuprofen, Motrin, Naproxen, Naprosyn and Aspirin based products such as Excedrin, Goodys Powder, BC Powder. (May take Tylenol or Acetaminophen if needed.) NONE AFTER November 17, 2018  Stop ANY OVER THE COUNTER supplements until after surgery. (May continue Vitamin D, Vitamin B, and multivitamin.)  Wear comfortable clothing (specific to your surgery type) to the hospital.  Plan for stool softeners for home use.  If you are being admitted to the hospital overnight, leave your suitcase in the car. After surgery it may be brought to your room.  If you are being discharged the day of surgery, you will not be allowed to drive home. You will need a responsible adult to drive you home and stay with you that night.   If you are taking public transportation, you will need to have a responsible adult with you. Please confirm with your physician that it is acceptable to use public transportation.   Please call [phone] if you have any questions about these instructions.

## 2018-11-13 LAB — URINE CULTURE: Special Requests: NORMAL

## 2018-11-13 LAB — TYPE AND SCREEN
ABO/RH(D): B POS
Antibody Screen: NEGATIVE

## 2018-11-13 NOTE — Pre-Procedure Instructions (Signed)
REQUEST/ EKG FOR CARDIAC CLEARANCE CALLED AND FAXED TO [facility] AT DR Clydell Hakim

## 2018-11-14 LAB — IGE: IGE (IMMUNOGLOBULIN E), SERUM: 103 [IU]/mL (ref 6–495)

## 2018-11-14 NOTE — Pre-Procedure Instructions (Signed)
Urine culture results sent to Dr. Marry Guan for review.  Asked if wanted it recollected?

## 2018-11-14 NOTE — Pre-Procedure Instructions (Signed)
Most recent EKG to compare from one completed on 11/12/2018.     ECG 12 Lead7/21/2016 [facility] Component Name Value Ref Range  EKG Ventricular Rate 62 BPM   EKG Atrial Rate 62 BPM   EKG P-R Interval 222 ms  EKG QRS Duration 100 ms  EKG Q-T Interval 424 ms  EKG QTC Calculation 430 ms  EKG Calculated P Axis 64 degrees   EKG Calculated R Axis 37 degrees   EKG Calculated T Axis 85 degrees   Result Narrative  SINUS RHYTHM WITH 1ST DEGREE AV BLOCK OTHERWISE NORMAL ECG NO PREVIOUS ECGS AVAILABLE Confirmed by ROSE-JONES, LISA ([id number]) on 04/28/2015 5:31:38 PM

## 2018-11-14 NOTE — Progress Notes (Signed)
Cardiac clearance cancelled per Dr. Rosey Bath, EKG confirmed, NSR and unchanged from previous, no cardiac symptoms.  Dr. Clydell Hakim office notified.

## 2018-11-17 NOTE — Pre-Procedure Instructions (Signed)
PATIENT COMING IN AM FOR REPEAT UC

## 2018-11-18 ENCOUNTER — Encounter
Admission: RE | Admit: 2018-11-18 | Discharge: 2018-11-18 | Disposition: A | Discharge status: Home / Self Care | Payer: Medicare Other | Source: Ambulatory Visit | Attending: Orthopedic Surgery | Admitting: Orthopedic Surgery

## 2018-11-18 DIAGNOSIS — Z01812 Encounter for preprocedural laboratory examination: Secondary | ICD-10-CM | POA: Insufficient documentation

## 2018-11-19 LAB — URINE CULTURE: Culture: NO GROWTH

## 2018-11-23 MED ORDER — CLINDAMYCIN PHOSPHATE 900 MG/50ML IV SOLN
900.0000 mg | INTRAVENOUS | Status: AC
  Administered 2018-11-24: 900 mg via INTRAVENOUS

## 2018-11-23 MED ORDER — TRANEXAMIC ACID-NACL 1000-0.7 MG/100ML-% IV SOLN
1000.0000 mg | INTRAVENOUS | Status: AC
  Administered 2018-11-24: 1000 mg via INTRAVENOUS
  Filled 2018-11-23: qty 100

## 2018-11-24 ENCOUNTER — Inpatient Hospital Stay: Payer: Medicare Other | Admitting: Anesthesiology

## 2018-11-24 ENCOUNTER — Other Ambulatory Visit: Payer: Self-pay

## 2018-11-24 ENCOUNTER — Inpatient Hospital Stay: Payer: Medicare Other

## 2018-11-24 ENCOUNTER — Encounter
Admission: RE | Disposition: A | Discharge status: Skilled Nursing Facility | Payer: Self-pay | Source: Home / Self Care | Attending: Orthopedic Surgery

## 2018-11-24 ENCOUNTER — Inpatient Hospital Stay
Admission: RE | Admit: 2018-11-24 | Discharge: 2018-11-27 | DRG: 470 | Disposition: A | Discharge status: Skilled Nursing Facility | Payer: Medicare Other | Attending: Orthopedic Surgery | Admitting: Orthopedic Surgery

## 2018-11-24 ENCOUNTER — Encounter: Payer: Self-pay | Admitting: Orthopedic Surgery

## 2018-11-24 DIAGNOSIS — E669 Obesity, unspecified: Secondary | ICD-10-CM | POA: Diagnosis present

## 2018-11-24 DIAGNOSIS — Z96659 Presence of unspecified artificial knee joint: Secondary | ICD-10-CM

## 2018-11-24 DIAGNOSIS — Z6837 Body mass index (BMI) 37.0-37.9, adult: Secondary | ICD-10-CM

## 2018-11-24 DIAGNOSIS — F329 Major depressive disorder, single episode, unspecified: Secondary | ICD-10-CM | POA: Diagnosis present

## 2018-11-24 DIAGNOSIS — F330 Major depressive disorder, recurrent, mild: Secondary | ICD-10-CM | POA: Insufficient documentation

## 2018-11-24 DIAGNOSIS — Z791 Long term (current) use of non-steroidal anti-inflammatories (NSAID): Secondary | ICD-10-CM | POA: Diagnosis not present

## 2018-11-24 DIAGNOSIS — M1712 Unilateral primary osteoarthritis, left knee: Secondary | ICD-10-CM | POA: Diagnosis present

## 2018-11-24 DIAGNOSIS — H9190 Unspecified hearing loss, unspecified ear: Secondary | ICD-10-CM | POA: Diagnosis present

## 2018-11-24 DIAGNOSIS — M25762 Osteophyte, left knee: Secondary | ICD-10-CM | POA: Diagnosis present

## 2018-11-24 DIAGNOSIS — G4730 Sleep apnea, unspecified: Secondary | ICD-10-CM | POA: Diagnosis present

## 2018-11-24 DIAGNOSIS — Z7982 Long term (current) use of aspirin: Secondary | ICD-10-CM

## 2018-11-24 DIAGNOSIS — Z79899 Other long term (current) drug therapy: Secondary | ICD-10-CM

## 2018-11-24 DIAGNOSIS — Z21 Asymptomatic human immunodeficiency virus [HIV] infection status: Secondary | ICD-10-CM | POA: Diagnosis present

## 2018-11-24 DIAGNOSIS — I10 Essential (primary) hypertension: Secondary | ICD-10-CM | POA: Diagnosis present

## 2018-11-24 DIAGNOSIS — I2510 Atherosclerotic heart disease of native coronary artery without angina pectoris: Secondary | ICD-10-CM | POA: Diagnosis present

## 2018-11-24 HISTORY — PX: KNEE ARTHROPLASTY: SHX992

## 2018-11-24 LAB — ABO/RH: ABO/RH(D): B POS

## 2018-11-24 SURGERY — ARTHROPLASTY, KNEE, TOTAL, USING IMAGELESS COMPUTER-ASSISTED NAVIGATION
Anesthesia: Spinal | Site: Knee | Laterality: Left

## 2018-11-24 MED ORDER — MIDAZOLAM HCL 5 MG/5ML IJ SOLN
INTRAMUSCULAR | Status: DC | PRN
  Administered 2018-11-24: 0.5 mg via INTRAVENOUS

## 2018-11-24 MED ORDER — HYDROMORPHONE HCL 1 MG/ML IJ SOLN: 0.5000 mg | INTRAMUSCULAR | Status: DC | PRN

## 2018-11-24 MED ORDER — ACETAMINOPHEN 10 MG/ML IV SOLN
INTRAVENOUS | Status: DC | PRN
  Administered 2018-11-24: 1000 mg via INTRAVENOUS

## 2018-11-24 MED ORDER — SODIUM CHLORIDE 0.9 % IV SOLN
INTRAVENOUS | Status: DC
  Administered 2018-11-24 (×2): via INTRAVENOUS

## 2018-11-24 MED ORDER — METOCLOPRAMIDE HCL 10 MG PO TABS: 5.0000 mg | ORAL_TABLET | Freq: Three times a day (TID) | ORAL | Status: DC | PRN

## 2018-11-24 MED ORDER — GLYCOPYRROLATE 0.2 MG/ML IJ SOLN
INTRAMUSCULAR | Status: AC
  Filled 2018-11-24: qty 1

## 2018-11-24 MED ORDER — ACETAMINOPHEN 10 MG/ML IV SOLN
INTRAVENOUS | Status: AC
  Filled 2018-11-24: qty 100

## 2018-11-24 MED ORDER — FLUOXETINE HCL 20 MG PO CAPS
40.0000 mg | ORAL_CAPSULE | Freq: Every day | ORAL | Status: DC
  Administered 2018-11-25 – 2018-11-27 (×3): 40 mg via ORAL
  Filled 2018-11-24 (×3): qty 2

## 2018-11-24 MED ORDER — BUPIVACAINE HCL (PF) 0.25 % IJ SOLN
INTRAMUSCULAR | Status: DC | PRN
  Administered 2018-11-24: 60 mL

## 2018-11-24 MED ORDER — FENTANYL CITRATE (PF) 100 MCG/2ML IJ SOLN
INTRAMUSCULAR | Status: DC | PRN
  Administered 2018-11-24 (×8): 50 ug via INTRAVENOUS

## 2018-11-24 MED ORDER — LIDOCAINE HCL (PF) 2 % IJ SOLN
INTRAMUSCULAR | Status: AC
  Filled 2018-11-24: qty 10

## 2018-11-24 MED ORDER — ENOXAPARIN SODIUM 30 MG/0.3ML ~~LOC~~ SOLN
30.0000 mg | Freq: Two times a day (BID) | SUBCUTANEOUS | Status: DC
  Administered 2018-11-25 – 2018-11-27 (×5): 30 mg via SUBCUTANEOUS
  Filled 2018-11-24 (×6): qty 0.3

## 2018-11-24 MED ORDER — FENTANYL CITRATE (PF) 250 MCG/5ML IJ SOLN
INTRAMUSCULAR | Status: AC
  Filled 2018-11-24: qty 5

## 2018-11-24 MED ORDER — PROPOFOL 500 MG/50ML IV EMUL
INTRAVENOUS | Status: AC
  Filled 2018-11-24: qty 50

## 2018-11-24 MED ORDER — FENTANYL CITRATE (PF) 100 MCG/2ML IJ SOLN
INTRAMUSCULAR | Status: AC
  Filled 2018-11-24: qty 2

## 2018-11-24 MED ORDER — KETAMINE HCL 50 MG/ML IJ SOLN
INTRAMUSCULAR | Status: AC
  Filled 2018-11-24: qty 10

## 2018-11-24 MED ORDER — BUPIVACAINE HCL (PF) 0.25 % IJ SOLN
INTRAMUSCULAR | Status: AC
  Filled 2018-11-24: qty 60

## 2018-11-24 MED ORDER — METHOCARBAMOL 500 MG PO TABS
500.0000 mg | ORAL_TABLET | Freq: Three times a day (TID) | ORAL | Status: DC
  Administered 2018-11-24 – 2018-11-27 (×9): 500 mg via ORAL
  Filled 2018-11-24 (×9): qty 1

## 2018-11-24 MED ORDER — CLINDAMYCIN PHOSPHATE 600 MG/50ML IV SOLN
600.0000 mg | Freq: Four times a day (QID) | INTRAVENOUS | Status: AC
  Administered 2018-11-24 – 2018-11-25 (×4): 600 mg via INTRAVENOUS
  Filled 2018-11-24 (×4): qty 50

## 2018-11-24 MED ORDER — ACETAMINOPHEN 325 MG PO TABS: 325.0000 mg | ORAL_TABLET | Freq: Four times a day (QID) | ORAL | Status: DC | PRN

## 2018-11-24 MED ORDER — CHLORHEXIDINE GLUCONATE 4 % EX LIQD: 60.0000 mL | Freq: Once | CUTANEOUS | Status: DC

## 2018-11-24 MED ORDER — METOCLOPRAMIDE HCL 10 MG PO TABS
10.0000 mg | ORAL_TABLET | Freq: Three times a day (TID) | ORAL | Status: AC
  Administered 2018-11-24 – 2018-11-26 (×7): 10 mg via ORAL
  Filled 2018-11-24 (×7): qty 1

## 2018-11-24 MED ORDER — HYDROCHLOROTHIAZIDE 25 MG PO TABS
25.0000 mg | ORAL_TABLET | Freq: Every evening | ORAL | Status: DC
  Administered 2018-11-24 – 2018-11-26 (×3): 25 mg via ORAL
  Filled 2018-11-24 (×3): qty 1

## 2018-11-24 MED ORDER — SENNOSIDES-DOCUSATE SODIUM 8.6-50 MG PO TABS
1.0000 | ORAL_TABLET | Freq: Two times a day (BID) | ORAL | Status: DC
  Administered 2018-11-24 – 2018-11-27 (×5): 1 via ORAL
  Filled 2018-11-24 (×6): qty 1

## 2018-11-24 MED ORDER — MAGNESIUM HYDROXIDE 400 MG/5ML PO SUSP
30.0000 mL | Freq: Every day | ORAL | Status: DC
  Administered 2018-11-25 – 2018-11-27 (×2): 30 mL via ORAL
  Filled 2018-11-24 (×2): qty 30

## 2018-11-24 MED ORDER — DEXAMETHASONE SODIUM PHOSPHATE 10 MG/ML IJ SOLN
INTRAMUSCULAR | Status: AC
  Filled 2018-11-24: qty 1

## 2018-11-24 MED ORDER — DEXAMETHASONE SODIUM PHOSPHATE 10 MG/ML IJ SOLN
8.0000 mg | Freq: Once | INTRAMUSCULAR | Status: AC
  Administered 2018-11-24: 8 mg via INTRAVENOUS

## 2018-11-24 MED ORDER — CELECOXIB 200 MG PO CAPS
200.0000 mg | ORAL_CAPSULE | Freq: Two times a day (BID) | ORAL | Status: DC
  Administered 2018-11-24 – 2018-11-27 (×6): 200 mg via ORAL
  Filled 2018-11-24 (×6): qty 1

## 2018-11-24 MED ORDER — FAMOTIDINE 20 MG PO TABS
20.0000 mg | ORAL_TABLET | Freq: Once | ORAL | Status: AC
  Administered 2018-11-24: 20 mg via ORAL

## 2018-11-24 MED ORDER — CLINDAMYCIN PHOSPHATE 900 MG/50ML IV SOLN
INTRAVENOUS | Status: AC
  Filled 2018-11-24: qty 50

## 2018-11-24 MED ORDER — SODIUM CHLORIDE FLUSH 0.9 % IV SOLN
INTRAVENOUS | Status: AC
  Filled 2018-11-24: qty 40

## 2018-11-24 MED ORDER — CELECOXIB 200 MG PO CAPS
400.0000 mg | ORAL_CAPSULE | Freq: Once | ORAL | Status: AC
  Administered 2018-11-24: 400 mg via ORAL

## 2018-11-24 MED ORDER — AMLODIPINE BESYLATE 5 MG PO TABS
5.0000 mg | ORAL_TABLET | Freq: Every day | ORAL | Status: DC
  Administered 2018-11-25 – 2018-11-27 (×3): 5 mg via ORAL
  Filled 2018-11-24 (×3): qty 1

## 2018-11-24 MED ORDER — FAMOTIDINE 20 MG PO TABS
ORAL_TABLET | ORAL | Status: AC
  Filled 2018-11-24: qty 1

## 2018-11-24 MED ORDER — OXYCODONE HCL 5 MG PO TABS
5.0000 mg | ORAL_TABLET | ORAL | Status: DC | PRN
  Administered 2018-11-27: 5 mg via ORAL
  Filled 2018-11-24: qty 1

## 2018-11-24 MED ORDER — ALUM & MAG HYDROXIDE-SIMETH 200-200-20 MG/5ML PO SUSP: 30.0000 mL | ORAL | Status: DC | PRN

## 2018-11-24 MED ORDER — BISACODYL 10 MG RE SUPP: 10.0000 mg | Freq: Every day | RECTAL | Status: DC | PRN

## 2018-11-24 MED ORDER — ROCURONIUM BROMIDE 100 MG/10ML IV SOLN
INTRAVENOUS | Status: DC | PRN
  Administered 2018-11-24: 100 mg via INTRAVENOUS

## 2018-11-24 MED ORDER — ONDANSETRON HCL 4 MG PO TABS: 4.0000 mg | ORAL_TABLET | Freq: Four times a day (QID) | ORAL | Status: DC | PRN

## 2018-11-24 MED ORDER — FLUTICASONE PROPIONATE 50 MCG/ACT NA SUSP: 1.0000 | Freq: Every day | NASAL | Status: DC | PRN

## 2018-11-24 MED ORDER — MENTHOL 3 MG MT LOZG: 1.0000 | LOZENGE | OROMUCOSAL | Status: DC | PRN

## 2018-11-24 MED ORDER — ADULT MULTIVITAMIN W/MINERALS CH
1.0000 | ORAL_TABLET | Freq: Every day | ORAL | Status: DC
  Administered 2018-11-25 – 2018-11-27 (×3): 1 via ORAL
  Filled 2018-11-24 (×3): qty 1

## 2018-11-24 MED ORDER — OXYCODONE HCL 5 MG PO TABS
10.0000 mg | ORAL_TABLET | ORAL | Status: DC | PRN
  Administered 2018-11-24 – 2018-11-26 (×5): 10 mg via ORAL
  Filled 2018-11-24 (×5): qty 2

## 2018-11-24 MED ORDER — BUPIVACAINE LIPOSOME 1.3 % IJ SUSP
INTRAMUSCULAR | Status: AC
  Filled 2018-11-24: qty 20

## 2018-11-24 MED ORDER — ONDANSETRON HCL 4 MG/2ML IJ SOLN
4.0000 mg | Freq: Four times a day (QID) | INTRAMUSCULAR | Status: DC | PRN
  Administered 2018-11-26: 4 mg via INTRAVENOUS
  Filled 2018-11-24: qty 2

## 2018-11-24 MED ORDER — PANTOPRAZOLE SODIUM 40 MG PO TBEC
40.0000 mg | DELAYED_RELEASE_TABLET | Freq: Two times a day (BID) | ORAL | Status: DC
  Administered 2018-11-24 – 2018-11-27 (×6): 40 mg via ORAL
  Filled 2018-11-24 (×6): qty 1

## 2018-11-24 MED ORDER — ONDANSETRON HCL 4 MG/2ML IJ SOLN
INTRAMUSCULAR | Status: DC | PRN
  Administered 2018-11-24: 4 mg via INTRAVENOUS

## 2018-11-24 MED ORDER — PHENOL 1.4 % MT LIQD: 1.0000 | OROMUCOSAL | Status: DC | PRN

## 2018-11-24 MED ORDER — PHENYLEPHRINE HCL 10 MG/ML IJ SOLN
INTRAMUSCULAR | Status: AC
  Filled 2018-11-24: qty 2

## 2018-11-24 MED ORDER — LOSARTAN POTASSIUM 50 MG PO TABS
50.0000 mg | ORAL_TABLET | Freq: Every evening | ORAL | Status: DC
  Administered 2018-11-24 – 2018-11-26 (×3): 50 mg via ORAL
  Filled 2018-11-24 (×3): qty 1

## 2018-11-24 MED ORDER — CELECOXIB 200 MG PO CAPS
ORAL_CAPSULE | ORAL | Status: AC
  Filled 2018-11-24: qty 1

## 2018-11-24 MED ORDER — GENTAMICIN SULFATE 40 MG/ML IJ SOLN
INTRAMUSCULAR | Status: AC
  Filled 2018-11-24: qty 8

## 2018-11-24 MED ORDER — ACETAMINOPHEN 10 MG/ML IV SOLN
1000.0000 mg | Freq: Four times a day (QID) | INTRAVENOUS | Status: AC
  Administered 2018-11-24 – 2018-11-25 (×4): 1000 mg via INTRAVENOUS
  Filled 2018-11-24 (×4): qty 100

## 2018-11-24 MED ORDER — TRAMADOL HCL 50 MG PO TABS: 50.0000 mg | ORAL_TABLET | ORAL | Status: DC | PRN

## 2018-11-24 MED ORDER — BUPIVACAINE HCL (PF) 0.5 % IJ SOLN
INTRAMUSCULAR | Status: AC
  Filled 2018-11-24: qty 10

## 2018-11-24 MED ORDER — LIDOCAINE HCL (CARDIAC) PF 100 MG/5ML IV SOSY
PREFILLED_SYRINGE | INTRAVENOUS | Status: DC | PRN
  Administered 2018-11-24: 100 mg via INTRAVENOUS

## 2018-11-24 MED ORDER — FLEET ENEMA 7-19 GM/118ML RE ENEM: 1.0000 | ENEMA | Freq: Once | RECTAL | Status: DC | PRN

## 2018-11-24 MED ORDER — HYDROMORPHONE HCL 1 MG/ML IJ SOLN: 0.2500 mg | INTRAMUSCULAR | Status: DC | PRN

## 2018-11-24 MED ORDER — GABAPENTIN 300 MG PO CAPS
300.0000 mg | ORAL_CAPSULE | Freq: Once | ORAL | Status: AC
  Administered 2018-11-24: 300 mg via ORAL

## 2018-11-24 MED ORDER — DOXYLAMINE SUCCINATE (SLEEP) 25 MG PO TABS
25.0000 mg | ORAL_TABLET | Freq: Every evening | ORAL | Status: DC | PRN
  Filled 2018-11-24: qty 1

## 2018-11-24 MED ORDER — PROPOFOL 10 MG/ML IV BOLUS
INTRAVENOUS | Status: DC | PRN
  Administered 2018-11-24: 120 mg via INTRAVENOUS

## 2018-11-24 MED ORDER — METOCLOPRAMIDE HCL 5 MG/ML IJ SOLN: 5.0000 mg | Freq: Three times a day (TID) | INTRAMUSCULAR | Status: DC | PRN

## 2018-11-24 MED ORDER — SODIUM CHLORIDE 0.9 % IV SOLN
INTRAVENOUS | Status: DC | PRN
  Administered 2018-11-24: 1000 mL

## 2018-11-24 MED ORDER — TRAZODONE HCL 50 MG PO TABS: 50.0000 mg | ORAL_TABLET | Freq: Every evening | ORAL | Status: DC | PRN

## 2018-11-24 MED ORDER — TRANEXAMIC ACID-NACL 1000-0.7 MG/100ML-% IV SOLN
1000.0000 mg | Freq: Once | INTRAVENOUS | Status: AC
  Administered 2018-11-24: 1000 mg via INTRAVENOUS
  Filled 2018-11-24: qty 100

## 2018-11-24 MED ORDER — LACTATED RINGERS IV SOLN
INTRAVENOUS | Status: DC
  Administered 2018-11-24: 09:00:00 via INTRAVENOUS
  Administered 2018-11-24: 1000 mL via INTRAVENOUS

## 2018-11-24 MED ORDER — SODIUM CHLORIDE 0.9 % IV SOLN
INTRAVENOUS | Status: DC | PRN
  Administered 2018-11-24: 60 mL

## 2018-11-24 MED ORDER — KETAMINE HCL 50 MG/ML IJ SOLN
INTRAMUSCULAR | Status: DC | PRN
  Administered 2018-11-24: 50 mg via INTRAMUSCULAR

## 2018-11-24 MED ORDER — FERROUS SULFATE 325 (65 FE) MG PO TABS
325.0000 mg | ORAL_TABLET | Freq: Two times a day (BID) | ORAL | Status: DC
  Administered 2018-11-24 – 2018-11-27 (×6): 325 mg via ORAL
  Filled 2018-11-24 (×6): qty 1

## 2018-11-24 MED ORDER — GABAPENTIN 300 MG PO CAPS
300.0000 mg | ORAL_CAPSULE | Freq: Every day | ORAL | Status: DC
  Administered 2018-11-24 – 2018-11-26 (×3): 300 mg via ORAL
  Filled 2018-11-24 (×3): qty 1

## 2018-11-24 MED ORDER — DIPHENHYDRAMINE HCL 12.5 MG/5ML PO ELIX: 12.5000 mg | ORAL_SOLUTION | ORAL | Status: DC | PRN

## 2018-11-24 MED ORDER — GABAPENTIN 300 MG PO CAPS
ORAL_CAPSULE | ORAL | Status: AC
  Filled 2018-11-24: qty 1

## 2018-11-24 MED ORDER — ATORVASTATIN CALCIUM 20 MG PO TABS
40.0000 mg | ORAL_TABLET | Freq: Every day | ORAL | Status: DC
  Administered 2018-11-24 – 2018-11-26 (×3): 40 mg via ORAL
  Filled 2018-11-24 (×3): qty 2

## 2018-11-24 MED ORDER — MIDAZOLAM HCL 2 MG/2ML IJ SOLN
INTRAMUSCULAR | Status: AC
  Filled 2018-11-24: qty 2

## 2018-11-24 SURGICAL SUPPLY — 69 items
BATTERY INSTRU NAVIGATION (MISCELLANEOUS) ×8 IMPLANT
BLADE SAW 70X12.5 (BLADE) ×2 IMPLANT
BLADE SAW 90X13X1.19 OSCILLAT (BLADE) ×2 IMPLANT
BLADE SAW 90X25X1.19 OSCILLAT (BLADE) ×2 IMPLANT
CANISTER SUCT 1200ML W/VALVE (MISCELLANEOUS) ×2 IMPLANT
CANISTER SUCT 3000ML PPV (MISCELLANEOUS) ×4 IMPLANT
CEMENT HV SMART SET (Cement) ×4 IMPLANT
CEMENT TIBIA MBT (Knees) ×1 IMPLANT
COOLER POLAR GLACIER W/PUMP (MISCELLANEOUS) ×2 IMPLANT
COVER WAND RF STERILE (DRAPES) ×2 IMPLANT
CUFF TOURN 24 STER (MISCELLANEOUS) IMPLANT
CUFF TOURN 30 STER DUAL PORT (MISCELLANEOUS) IMPLANT
CUFF TOURN SGL QUICK 34 (TOURNIQUET CUFF) ×1
CUFF TRNQT CYL 34X4.125X (TOURNIQUET CUFF) ×1 IMPLANT
DRAPE SHEET LG 3/4 BI-LAMINATE (DRAPES) ×2 IMPLANT
DRSG DERMACEA 8X12 NADH (GAUZE/BANDAGES/DRESSINGS) ×2 IMPLANT
DRSG OPSITE POSTOP 4X14 (GAUZE/BANDAGES/DRESSINGS) ×2 IMPLANT
DRSG TEGADERM 4X4.75 (GAUZE/BANDAGES/DRESSINGS) ×2 IMPLANT
DURAPREP 26ML APPLICATOR (WOUND CARE) ×4 IMPLANT
ELECT CAUTERY BLADE 6.4 (BLADE) ×2 IMPLANT
ELECT REM PT RETURN 9FT ADLT (ELECTROSURGICAL) ×2
ELECTRODE REM PT RTRN 9FT ADLT (ELECTROSURGICAL) ×1 IMPLANT
EX-PIN ORTHOLOCK NAV 4X150 (PIN) ×4 IMPLANT
GLOVE BIOGEL M STRL SZ7.5 (GLOVE) ×4 IMPLANT
GLOVE INDICATOR 8.0 STRL GRN (GLOVE) ×2 IMPLANT
GOWN STRL REUS W/ TWL LRG LVL3 (GOWN DISPOSABLE) ×2 IMPLANT
GOWN STRL REUS W/TWL LRG LVL3 (GOWN DISPOSABLE) ×2
HEMOVAC 400CC 10FR (MISCELLANEOUS) ×2 IMPLANT
HOLDER FOLEY CATH W/STRAP (MISCELLANEOUS) ×2 IMPLANT
HOOD PEEL AWAY FLYTE STAYCOOL (MISCELLANEOUS) ×4 IMPLANT
IMPL FEMUR SIGMA LT PS SZ 3 (Knees) ×1 IMPLANT
IMPLANT FEMUR SIGMA LT PS SZ 3 (Knees) ×2 IMPLANT
INSERT TIBIAL PFC SIG SZ3 10MM (Knees) ×2 IMPLANT
KIT TURNOVER KIT A (KITS) ×2 IMPLANT
KNIFE SCULPS 14X20 (INSTRUMENTS) ×2 IMPLANT
LABEL OR SOLS (LABEL) ×2 IMPLANT
MANIFOLD NEPTUNE WASTE (CANNULA) ×2 IMPLANT
NDL SAFETY ECLIPSE 18X1.5 (NEEDLE) ×1 IMPLANT
NEEDLE HYPO 18GX1.5 SHARP (NEEDLE) ×1
NEEDLE SPNL 20GX3.5 QUINCKE YW (NEEDLE) ×4 IMPLANT
NS IRRIG 500ML POUR BTL (IV SOLUTION) ×2 IMPLANT
PACK TOTAL KNEE (MISCELLANEOUS) ×2 IMPLANT
PAD WRAPON POLAR KNEE (MISCELLANEOUS) ×1 IMPLANT
PATELLA DOME PFC 35MM (Knees) ×2 IMPLANT
PENCIL SMOKE ULTRAEVAC 22 CON (MISCELLANEOUS) ×2 IMPLANT
PIN DRILL QUICK PACK ×2 IMPLANT
PIN FIXATION 1/8DIA X 3INL (PIN) ×6 IMPLANT
PULSAVAC PLUS IRRIG FAN TIP (DISPOSABLE) ×2
SOL .9 NS 3000ML IRR  AL (IV SOLUTION) ×1
SOL .9 NS 3000ML IRR UROMATIC (IV SOLUTION) ×1 IMPLANT
SOL PREP PVP 2OZ (MISCELLANEOUS) ×2
SOLUTION PREP PVP 2OZ (MISCELLANEOUS) ×1 IMPLANT
SPONGE DRAIN TRACH 4X4 STRL 2S (GAUZE/BANDAGES/DRESSINGS) ×2 IMPLANT
STAPLER SKIN PROX 35W (STAPLE) ×2 IMPLANT
STOCKINETTE IMPERV 14X48 (MISCELLANEOUS) IMPLANT
STRAP TIBIA SHORT (MISCELLANEOUS) ×2 IMPLANT
SUCTION FRAZIER HANDLE 10FR (MISCELLANEOUS) ×1
SUCTION TUBE FRAZIER 10FR DISP (MISCELLANEOUS) ×1 IMPLANT
SUT VIC AB 0 CT1 36 (SUTURE) ×2 IMPLANT
SUT VIC AB 1 CT1 36 (SUTURE) ×4 IMPLANT
SUT VIC AB 2-0 CT2 27 (SUTURE) ×2 IMPLANT
SYR 20CC LL (SYRINGE) ×2 IMPLANT
SYR 30ML LL (SYRINGE) ×4 IMPLANT
TIBIA MBT CEMENT (Knees) ×2 IMPLANT
TIP FAN IRRIG PULSAVAC PLUS (DISPOSABLE) ×1 IMPLANT
TOWEL OR 17X26 4PK STRL BLUE (TOWEL DISPOSABLE) ×2 IMPLANT
TOWER CARTRIDGE SMART MIX (DISPOSABLE) ×2 IMPLANT
TRAY FOLEY MTR SLVR 16FR STAT (SET/KITS/TRAYS/PACK) ×2 IMPLANT
WRAPON POLAR PAD KNEE (MISCELLANEOUS) ×2

## 2018-11-24 NOTE — Anesthesia Post-op Follow-up Note (Signed)
Anesthesia QCDR form completed.        

## 2018-11-24 NOTE — Anesthesia Procedure Notes (Signed)
Procedure Name: Intubation Date/Time: 11/24/2018 7:34 AM Performed by: Bernardo Heater, CRNA Pre-anesthesia Checklist: Patient identified, Emergency Drugs available, Suction available and Patient being monitored Patient Re-evaluated:Patient Re-evaluated prior to induction Oxygen Delivery Method: Circle system utilized Preoxygenation: Pre-oxygenation with 100% oxygen Induction Type: IV induction Laryngoscope Size: Mac and 3 Grade View: Grade I Tube size: 7.0 mm Number of attempts: 1 Placement Confirmation: ETT inserted through vocal cords under direct vision,  positive ETCO2 and breath sounds checked- equal and bilateral Secured at: 21 cm Tube secured with: Tape Dental Injury: Teeth and Oropharynx as per pre-operative assessment

## 2018-11-24 NOTE — Op Note (Signed)
OPERATIVE NOTE  DATE OF SURGERY:  11/24/2018  PATIENT NAME:  Amber Bridges   DOB: Dec 10, 1940  MRN: [id number]  PRE-OPERATIVE DIAGNOSIS: Degenerative arthrosis of the left knee, primary  POST-OPERATIVE DIAGNOSIS:  Same  PROCEDURE:  Left total knee arthroplasty using computer-assisted navigation  SURGEON:  Jena Gauss. M.D.  ASSISTANT: Benancio Deeds, PA-C (present and scrubbed throughout the case, critical for assistance with exposure, retraction, instrumentation, and closure)  ANESTHESIA: general  ESTIMATED BLOOD LOSS: 50 mL  FLUIDS REPLACED: 1300 mL of crystalloid  TOURNIQUET TIME: 116 minutes  DRAINS: 2 medium Hemovac drains  SOFT TISSUE RELEASES: Anterior cruciate ligament, posterior cruciate ligament, deep medial collateral ligament, patellofemoral ligament  IMPLANTS UTILIZED: DePuy PFC Sigma size 3 posterior stabilized femoral component (cemented), size 3 MBT tibial component (cemented), 35 mm 3 peg oval dome patella (cemented), and a 10 mm stabilized rotating platform polyethylene insert.  INDICATIONS FOR SURGERY: Amber Bridges is a 78 y.o. year old female with a long history of progressive knee pain. X-rays demonstrated severe degenerative changes in tricompartmental fashion. The patient had not seen any significant improvement despite conservative nonsurgical intervention. After discussion of the risks and benefits of surgical intervention, the patient expressed understanding of the risks benefits and agree with plans for total knee arthroplasty.   The risks, benefits, and alternatives were discussed at length including but not limited to the risks of infection, bleeding, nerve injury, stiffness, blood clots, the need for revision surgery, cardiopulmonary complications, among others, and they were willing to proceed.  PROCEDURE IN DETAIL: The patient was brought into the operating room and, after adequate general anesthesia was achieved, a tourniquet  was placed on the patient's upper thigh. The patient's knee and leg were cleaned and prepped with alcohol and DuraPrep and draped in the usual sterile fashion. A "timeout" was performed as per usual protocol. The lower extremity was exsanguinated using an Esmarch, and the tourniquet was inflated to 300 mmHg. An anterior longitudinal incision was made followed by a standard mid vastus approach. The deep fibers of the medial collateral ligament were elevated in a subperiosteal fashion off of the medial flare of the tibia so as to maintain a continuous soft tissue sleeve. The patella was subluxed laterally and the patellofemoral ligament was incised. Inspection of the knee demonstrated severe degenerative changes with full-thickness loss of articular cartilage. Osteophytes were debrided using a rongeur. Anterior and posterior cruciate ligaments were excised. Two 4.0 mm Schanz pins were inserted in the femur and into the tibia for attachment of the array of trackers used for computer-assisted navigation. Hip center was identified using a circumduction technique. Distal landmarks were mapped using the computer. The distal femur and proximal tibia were mapped using the computer. The distal femoral cutting guide was positioned using computer-assisted navigation so as to achieve a 5 distal valgus cut. The femur was sized and it was felt that a size 3 femoral component was appropriate. A size 3 femoral cutting guide was positioned and the anterior cut was performed and verified using the computer. This was followed by completion of the posterior and chamfer cuts. Femoral cutting guide for the central box was then positioned in the center box cut was performed.  Attention was then directed to the proximal tibia. Medial and lateral menisci were excised. The extramedullary tibial cutting guide was positioned using computer-assisted navigation so as to achieve a 0 varus-valgus alignment and 0 posterior slope. The cut was  performed and verified using the computer. The proximal  tibia was sized and it was felt that a size 3 tibial tray was appropriate. Tibial and femoral trials were inserted followed by insertion of a 10 mm polyethylene insert.  The knee was tight both in flexion and extension.  Components were removed.  The extramedullary tibial cutting guide was reapplied so as to remove an additional 4 mm of bone from the proximal tibia.  Trial components were reinserted along with a 10 mm polyethylene trial.  This allowed for excellent mediolateral soft tissue balancing both in flexion and in full extension. Finally, the patella was cut and prepared so as to accommodate a 35 mm 3 peg oval dome patella. A patella trial was placed and the knee was placed through a range of motion with excellent patellar tracking appreciated. The femoral trial was removed after debridement of posterior osteophytes. The central post-hole for the tibial component was reamed followed by insertion of a keel punch. Tibial trials were then removed. Cut surfaces of bone were irrigated with copious amounts of normal saline with antibiotic solution using pulsatile lavage and then suctioned dry. Polymethylmethacrylate cement was prepared in the usual fashion using a vacuum mixer. Cement was applied to the cut surface of the proximal tibia as well as along the undersurface of a size 3 MBT tibial component. Tibial component was positioned and impacted into place. Excess cement was removed using Personal assistant. Cement was then applied to the cut surfaces of the femur as well as along the posterior flanges of the size 3 femoral component. The femoral component was positioned and impacted into place. Excess cement was removed using Personal assistant. A 10 mm polyethylene trial was inserted and the knee was brought into full extension with steady axial compression applied. Finally, cement was applied to the backside of a 35 mm 3 peg oval dome patella and the patellar  component was positioned and patellar clamp applied. Excess cement was removed using Personal assistant. After adequate curing of the cement, the tourniquet was deflated after a total tourniquet time of 116 minutes. Hemostasis was achieved using electrocautery. The knee was irrigated with copious amounts of normal saline with antibiotic solution using pulsatile lavage and then suctioned dry. 20 mL of 1.3% Exparel and 60 mL of 0.25% Marcaine in 40 mL of normal saline was injected along the posterior capsule, medial and lateral gutters, and along the arthrotomy site. A 10 mm stabilized rotating platform polyethylene insert was inserted and the knee was placed through a range of motion with excellent mediolateral soft tissue balancing appreciated and excellent patellar tracking noted. 2 medium drains were placed in the wound bed and brought out through separate stab incisions. The medial parapatellar portion of the incision was reapproximated using interrupted sutures of #1 Vicryl. Subcutaneous tissue was approximated in layers using first #0 Vicryl followed #2-0 Vicryl. The skin was approximated with skin staples. A sterile dressing was applied.  The patient tolerated the procedure well and was transported to the recovery room in stable condition.    James P. Angie Fava., M.D.

## 2018-11-24 NOTE — Transfer of Care (Signed)
Immediate Anesthesia Transfer of Care Note  Patient: Amber Bridges  Procedure(s) Performed: COMPUTER ASSISTED TOTAL KNEE ARTHROPLASTY-LEFT (Left Knee)  Patient Location: PACU  Anesthesia Type:General  Level of Consciousness: awake, alert , oriented and patient cooperative  Airway & Oxygen Therapy: Patient Spontanous Breathing and Patient connected to nasal cannula oxygen  Post-op Assessment: Report given to RN and Post -op Vital signs reviewed and stable  Post vital signs: Reviewed and stable  Last Vitals:  Vitals Value Taken Time  BP    Temp    Pulse 87 11/24/2018 11:17 AM  Resp    SpO2 98 % 11/24/2018 11:17 AM  Vitals shown include unvalidated device data.  Last Pain:  Vitals:   11/24/18 0627  TempSrc: Tympanic  PainSc: 0-No pain         Complications: No apparent anesthesia complications

## 2018-11-24 NOTE — Anesthesia Preprocedure Evaluation (Addendum)
Anesthesia Evaluation  Patient identified by MRN, date of birth, ID band Patient awake    Reviewed: Allergy & Precautions, H&P , NPO status , Patient's Chart, lab work & pertinent test results  Airway Mallampati: III       Dental  (+) Partial Upper   Pulmonary sleep apnea , former smoker,           Cardiovascular hypertension, + CAD  (-) Past MI, (-) Cardiac Stents, (-) CABG and (-) CHF (-) dysrhythmias      Neuro/Psych PSYCHIATRIC DISORDERS Anxiety Depression  Neuromuscular disease (h/o cauda equina syndrome requiring decompression 2 years ago.  Reports residual numbness in b/l feet)    GI/Hepatic negative GI ROS, Neg liver ROS,   Endo/Other  negative endocrine ROS  Renal/GU      Musculoskeletal  (+) Arthritis ,   Abdominal   Peds  Hematology  (+) JEHOVAH'S WITNESS  Anesthesia Other Findings Obese  Past Medical History: No date: Arthritis No date: Bronchitis No date: Coronary artery disease No date: Depression No date: HOH (hard of hearing)     Comment:  AIDS No date: Hypertension No date: Sleep apnea     Comment:  CPAP  Past Surgical History: No date: ABDOMINAL HYSTERECTOMY No date: APPENDECTOMY No date: BACK SURGERY     Comment:  X 3 2017: CATARACT EXTRACTION W/ INTRAOCULAR LENS IMPLANT; Right 08/08/2016: CATARACT EXTRACTION W/PHACO; Left     Comment:  Procedure: CATARACT EXTRACTION PHACO AND INTRAOCULAR               LENS PLACEMENT (IOC);  Surgeon: Estill Cotta, MD;                Location: [facility] ORS;  Service: Ophthalmology;  Laterality:              Left;  fluid lot # [id number]  exp02/28/2019 [place]                  01:15.3 AP%   25.2 CDE   35.06  No date: CTR; Right No date: FOOT SURGERY No date: JOINT REPLACEMENT     Comment:  TKR No date: NOSE SURGERY No date: RCR; Left No date: SHOULDER ARTHROSCOPY; Right     Comment:  for bursitis 1973, 1978, 2016: SPINE SURGERY     Comment:  Most  recent Laminectomy to T11 and L3  BMI    Body Mass Index:  37.25 kg/m      Reproductive/Obstetrics negative OB ROS                           Anesthesia Physical Anesthesia Plan  ASA: III  Anesthesia Plan: General ETT   Post-op Pain Management:    Induction:   PONV Risk Score and Plan: Ondansetron and Dexamethasone  Airway Management Planned:   Additional Equipment:   Intra-op Plan:   Post-operative Plan:   Informed Consent: I have reviewed the patients History and Physical, chart, labs and discussed the procedure including the risks, benefits and alternatives for the proposed anesthesia with the patient or authorized representative who has indicated his/her understanding and acceptance.     Dental Advisory Given  Plan Discussed with: Anesthesiologist and CRNA  Anesthesia Plan Comments: (Discussed spinal vs GA.  As she has residual BLE numbness relating to her cauda equina syndrome, will proceed with GA.)       Anesthesia Quick Evaluation

## 2018-11-24 NOTE — H&P (Signed)
The patient has been re-examined, and the chart reviewed, and there have been no interval changes to the documented history and physical.    The risks, benefits, and alternatives have been discussed at length. The patient expressed understanding of the risks benefits and agreed with plans for surgical intervention.  James P. Hooten, Jr. M.D.    

## 2018-11-24 NOTE — Evaluation (Signed)
Physical Therapy Evaluation Patient Details Name: Amber Bridges MRN: [id number] DOB: Sep 09, 1941 Today's Date: 11/24/2018   History of Present Illness  Amber Bridges is a 78yo female who comes to [facility] for elective Left TKA. PMH: Right TKA (10ya), Rt sciatica . PTA pt lived alone performed all IADL, ADL, driving, housework. SPC for AMB outside of house.   Clinical Impression  Pt admitted with above diagnosis. Pt currently with functional limitations due to the deficits listed below (see "PT Problem List"). Upon entry, pt in bed, no family/caregiver present. The pt is awake and agreeable to participate.  The pt is alert and oriented x3, pleasant, conversational, and following simple commands consistently. Bed mobility at supervision level, minGuard assist for AMB and transfers, elevated surface required for transfers. Mild nausea limiting additional AMB as well as some Left knee buckling. Functional mobility assessment demonstrates increased effort/time requirements, poor tolerance, and need for physical assistance, whereas the patient performed these at a higher level of independence PTA. Pt will benefit from skilled PT intervention to increase independence and safety with basic mobility in preparation for discharge to the venue listed below.      Follow Up Recommendations Home health PT;Follow surgeon's recommendation for DC plan and follow-up therapies    Equipment Recommendations  None recommended by PT    Recommendations for Other Services       Precautions / Restrictions Precautions Precautions: Fall;Knee Restrictions Weight Bearing Restrictions: Yes LLE Weight Bearing: Weight bearing as tolerated      Mobility  Bed Mobility Overal bed mobility: Needs Assistance Bed Mobility: Supine to Sit     Supine to sit: Supervision     General bed mobility comments: additional time and effort, no assist  Transfers Overall transfer level: Needs assistance Equipment used:  Rolling walker (2 wheeled) Transfers: Sit to/from Stand Sit to Stand: From elevated surface;Supervision         General transfer comment: unable to perform form standard height  Ambulation/Gait Ambulation/Gait assistance: Min guard Gait Distance (Feet): 25 Feet Assistive device: Rolling walker (2 wheeled)       General Gait Details: Left knee buckling without instability. limited by weakness and nausea, pain well managed.   Stairs            Wheelchair Mobility    Modified Rankin (Stroke Patients Only)       Balance Overall balance assessment: History of Falls;Mild deficits observed, not formally tested(Left knee buckling without gait instability. )                                           Pertinent Vitals/Pain Pain Assessment: 0-10 Pain Score: 0-No pain    Home Living Family/patient expects to be discharged to:: Private residence Living Arrangements: Alone(plans to stay with DTR at DTR's home at DC ) Available Help at Discharge: Family(DTR, SIL plan to help) Type of Home: House Home Access: Stairs to enter Entrance Stairs-Rails: Left;Right;Can reach both Entrance Stairs-Number of Steps: 5 Home Layout: One level Home Equipment: Walker - 2 wheels;Cane - single point;Shower seat;Bedside commode      Prior Function Level of Independence: Independent with assistive device(s)         Comments: SPC in community, driving, grocery shopping; using a RW since January this year d/t acute onst sciatica.      Hand Dominance   Dominant Hand: Left    Extremity/Trunk  Assessment                Communication   Communication: No difficulties  Cognition Arousal/Alertness: Awake/alert Behavior During Therapy: WFL for tasks assessed/performed Overall Cognitive Status: Within Functional Limits for tasks assessed                                        General Comments      Exercises Total Joint Exercises Ankle  Circles/Pumps: AROM;Both;15 reps Quad Sets: AROM;Strengthening;Left;10 reps Short Arc Quad: Left;10 reps;AAROM Heel Slides: AAROM;Left;10 reps Hip ABduction/ADduction: AAROM;Left;10 reps Goniometric ROM: Left knee flexion P/ROM: -4 to 72 degrees.    Assessment/Plan    PT Assessment Patient needs continued PT services  PT Problem List Decreased range of motion;Decreased strength;Decreased activity tolerance;Decreased balance       PT Treatment Interventions Therapeutic exercise;DME instruction;Gait training;Balance training;Stair training;Functional mobility training;Therapeutic activities;Patient/family education    PT Goals (Current goals can be found in the Care Plan section)  Acute Rehab PT Goals Patient Stated Goal: return to DTRs home and do HHPT PT Goal Formulation: With patient Time For Goal Achievement: 12/08/18 Potential to Achieve Goals: Good    Frequency BID   Barriers to discharge        Co-evaluation               AM-PAC PT "6 Clicks" Mobility  Outcome Measure Help needed turning from your back to your side while in a flat bed without using bedrails?: None Help needed moving from lying on your back to sitting on the side of a flat bed without using bedrails?: None Help needed moving to and from a bed to a chair (including a wheelchair)?: A Little Help needed standing up from a chair using your arms (e.g., wheelchair or bedside chair)?: A Little Help needed to walk in hospital room?: A Little Help needed climbing 3-5 steps with a railing? : A Lot 6 Click Score: 19    End of Session Equipment Utilized During Treatment: Gait belt Activity Tolerance: Treatment limited secondary to medical complications (Comment);Patient tolerated treatment well;No increased pain(nausea) Patient left: in chair;with family/visitor present;with call bell/phone within reach;with chair alarm set;with SCD's reapplied(RT in room) Nurse Communication: Mobility status PT Visit  Diagnosis: Unsteadiness on feet (R26.81);Other abnormalities of gait and mobility (R26.89);Difficulty in walking, not elsewhere classified (R26.2)    Time: 2841-3244 PT Time Calculation (min) (ACUTE ONLY): 42 min   Charges:   PT Evaluation $PT Eval Moderate Complexity: 1 Mod PT Treatments $Gait Training: 8-22 mins $Therapeutic Exercise: 8-22 mins        4:25 PM, 11/24/18 Rosamaria Lints, PT, DPT Physical Therapist - [facility]  [phone] (ASCOM)    , C 11/24/2018, 4:23 PM

## 2018-11-24 NOTE — Anesthesia Postprocedure Evaluation (Signed)
Anesthesia Post Note  Patient: Amber Bridges  Procedure(s) Performed: COMPUTER ASSISTED TOTAL KNEE ARTHROPLASTY-LEFT (Left Knee)  Patient location during evaluation: PACU Anesthesia Type: Spinal and General Level of consciousness: awake and alert Pain management: pain level controlled Vital Signs Assessment: post-procedure vital signs reviewed and stable Respiratory status: spontaneous breathing, nonlabored ventilation, respiratory function stable and patient connected to nasal cannula oxygen Cardiovascular status: blood pressure returned to baseline and stable Postop Assessment: no apparent nausea or vomiting Anesthetic complications: no     Last Vitals:  Vitals:   11/24/18 1455 11/24/18 1552  BP: (!) 140/50 (!) 166/67  Pulse: 82 73  Resp: 17 17  Temp: 36.7 C   SpO2: 95% 97%    Last Pain:  Vitals:   11/24/18 1455  TempSrc: Oral  PainSc:                  [name]

## 2018-11-25 MED ORDER — TRAMADOL HCL 50 MG PO TABS: 50.0000 mg | ORAL_TABLET | ORAL | 0 refills | Status: DC | PRN

## 2018-11-25 MED ORDER — ENOXAPARIN SODIUM 40 MG/0.4ML ~~LOC~~ SOLN: 40.0000 mg | SUBCUTANEOUS | 0 refills | Status: DC

## 2018-11-25 NOTE — Progress Notes (Signed)
Physical Therapy Treatment Patient Details Name: Amber Bridges MRN: [id number] DOB: 03/04/1941 Today's Date: 11/25/2018    History of Present Illness Amber Bridges is a 78 yo female who comes to [facility] for elective Left TKA. PMH: Right TKA (10ya), Rt sciatica . PTA pt lived alone performed all IADL, ADL, driving, housework. SPC for AMB outside of house.     PT Comments    Pt presents with deficits in strength, transfers, mobility, gait, balance, L knee ROM, and activity tolerance.  Pt CGA with transfers with cues for sequencing.  Pt was able to amb 60 feet this session with a RW but ambulation was very effortful with step-through pattern regressing to step-to pattern with slow cadence.  Pt's L knee buckled during stance phase on one occasion with pt able to self-correct.  Pt's BP taken in standing this session at 150/52 mmHg and no c/o dizziness but pt did c/o nausea during amb, nursing notified.  Recommendation at discharge updated to SNF secondary to pt having difficulty with amb limited distances on a level surface with occasional L knee buckling and has 5 steps to enter her home which would not be safe for pt to attempt at this time.         Follow Up Recommendations  SNF;Supervision for mobility/OOB     Equipment Recommendations  None recommended by PT    Recommendations for Other Services       Precautions / Restrictions Precautions Precautions: Fall;Knee Restrictions Weight Bearing Restrictions: Yes LLE Weight Bearing: Weight bearing as tolerated    Mobility  Bed Mobility Overal bed mobility: Needs Assistance Bed Mobility: Supine to Sit;Sit to Supine     Supine to sit: Supervision Sit to supine: Min assist   General bed mobility comments: NT, pt in recliner  Transfers Overall transfer level: Needs assistance Equipment used: Rolling walker (2 wheeled) Transfers: Sit to/from Stand Sit to Stand: From elevated surface;Min guard         General  transfer comment: Min verbal and visual cues for proper sequencing with transfers  Ambulation/Gait Ambulation/Gait assistance: Min guard Gait Distance (Feet): 60 Feet Assistive device: Rolling walker (2 wheeled) Gait Pattern/deviations: Step-to pattern;Step-through pattern;Decreased stance time - left;Decreased step length - right;Antalgic;Trunk flexed Gait velocity: decreased   General Gait Details: Amb effortful with slow cadence and decreased LLE stance time with one instance of L knee buckling that pt was able to self-correct.     Stairs             Wheelchair Mobility    Modified Rankin (Stroke Patients Only)       Balance Overall balance assessment: Needs assistance;History of Falls Sitting-balance support: No upper extremity supported;Feet supported Sitting balance-Leahy Scale: Good     Standing balance support: Bilateral upper extremity supported Standing balance-Leahy Scale: Fair                              Cognition Arousal/Alertness: Awake/alert Behavior During Therapy: WFL for tasks assessed/performed Overall Cognitive Status: Within Functional Limits for tasks assessed                                        Exercises Total Joint Exercises Ankle Circles/Pumps: AROM;Both;15 reps;10 reps Quad Sets: AROM;Strengthening;Left;10 reps;15 reps Gluteal Sets: Strengthening;Both;10 reps Hip ABduction/ADduction: Left;10 reps;AROM Straight Leg Raises: AROM;Left;10 reps Long Arc Quad:  AROM;Strengthening;Left;10 reps;15 reps Knee Flexion: AROM;Strengthening;Left;10 reps;15 reps Goniometric ROM: L knee AROM: 0-52 deg Marching in Standing: AROM;Both;10 reps;Standing Other Exercises Other Exercises: HEP education review per handout Other Exercises: Car transfer sequencing education provided verbally Other Exercises: Positioning education provided    General Comments        Pertinent Vitals/Pain Pain Assessment: 0-10 Pain Score:  2  Pain Location: 0/10 L knee pain at rest, 2/10 during amb Pain Descriptors / Indicators: Aching Pain Intervention(s): Monitored during session    Home Living                      Prior Function            PT Goals (current goals can now be found in the care plan section) Progress towards PT goals: Progressing toward goals    Frequency    BID      PT Plan Discharge plan needs to be updated    Co-evaluation              AM-PAC PT "6 Clicks" Mobility   Outcome Measure  Help needed turning from your back to your side while in a flat bed without using bedrails?: A Little Help needed moving from lying on your back to sitting on the side of a flat bed without using bedrails?: A Little Help needed moving to and from a bed to a chair (including a wheelchair)?: A Little Help needed standing up from a chair using your arms (e.g., wheelchair or bedside chair)?: A Little Help needed to walk in hospital room?: A Little Help needed climbing 3-5 steps with a railing? : A Lot 6 Click Score: 17    End of Session Equipment Utilized During Treatment: Gait belt Activity Tolerance: Other (comment)(Pt limited by nausea, nursing notified) Patient left: Other (comment)(Pt left on [facility] with nursing present to assist) Nurse Communication: Mobility status;Other (comment)(Pt's c/o nausea) PT Visit Diagnosis: Unsteadiness on feet (R26.81);Other abnormalities of gait and mobility (R26.89);Difficulty in walking, not elsewhere classified (R26.2)     Time: 1610-9604 PT Time Calculation (min) (ACUTE ONLY): 26 min  Charges:  $Gait Training: 8-22 mins $Therapeutic Exercise: 8-22 mins $Therapeutic Activity: 8-22 mins                     D. Scott Cleatis Fandrich PT, DPT 11/25/18, 2:55 PM

## 2018-11-25 NOTE — Clinical Social Work Note (Signed)
CSW received referral for SNF.  Case discussed with case manager and plan is to discharge home with home health.  CSW to sign off please re-consult if social work needs arise.  Bayler Gehrig R. Kasia Trego, MSW, LCSW [phone]  

## 2018-11-25 NOTE — Progress Notes (Signed)
OT Cancellation Note  Patient Details Name: Amber Bridges MRN: [id number] DOB: Feb 23, 1941   Cancelled Treatment:    Reason Eval/Treat Not Completed: Other (comment). Pt working with PT upon attempt. Will re-attempt OT evaluation at later time as pt is available and medically appropriate.   Jeni Salles, MPH, MS, OTR/L ascom [phone] 11/25/18, 9:42 AM

## 2018-11-25 NOTE — Progress Notes (Signed)
   Subjective: 1 Day Post-Op Procedure(s) (LRB): COMPUTER ASSISTED TOTAL KNEE ARTHROPLASTY-LEFT (Left) Patient reports pain as 0 on 0-10 scale.   Patient is well, and has had no acute complaints or problems Patient did well with therapy yesterday.  Was able to ambulate 25 feet. Plan is to go Home after hospital stay. no nausea and no vomiting Patient denies any chest pains or shortness of breath. Patient rested well during the night. No complaints.  Objective: Vital signs in last 24 hours: Temp:  [97.3 F (36.3 C)-98.6 F (37 C)] 97.6 F (36.4 C) (02/18 0728) Pulse Rate:  [66-87] 66 (02/18 0728) Resp:  [14-21] 18 (02/18 0728) BP: (134-166)/(41-67) 161/60 (02/18 0728) SpO2:  [93 %-99 %] 99 % (02/18 0728) Heels are non tender and elevated off the bed using rolled towels along with bone foam under operative heel  Intake/Output from previous day: 02/17 0701 - 02/18 0700 In: 3362.8 [I.V.:2912.8; IV Piggyback:450] Out: 2165 [Urine:1905; Drains:210; Blood:50] Intake/Output this shift: No intake/output data recorded.  No results for input(s): HGB in the last 72 hours. No results for input(s): WBC, RBC, HCT, PLT in the last 72 hours. No results for input(s): NA, K, CL, CO2, BUN, CREATININE, GLUCOSE, CALCIUM in the last 72 hours. No results for input(s): LABPT, INR in the last 72 hours.  EXAM General - Patient is Alert, Appropriate and Oriented Extremity - Neurologically intact Neurovascular intact Sensation intact distally Intact pulses distally Dorsiflexion/Plantar flexion intact Compartment soft Dressing - dressing C/D/I Motor Function - intact, moving foot and toes well on exam.  Able to do straight leg raise on her own  Past Medical History:  Diagnosis Date  . Arthritis   . Bronchitis   . Coronary artery disease   . Depression   . HOH (hard of hearing)    AIDS  . Hypertension   . Sleep apnea    CPAP    Assessment/Plan: 1 Day Post-Op Procedure(s)  (LRB): COMPUTER ASSISTED TOTAL KNEE ARTHROPLASTY-LEFT (Left) Active Problems:   Total knee replacement status  Estimated body mass index is 37.25 kg/m as calculated from the following:   Height as of this encounter: 5' 6.5" (1.689 m).   Weight as of this encounter: 106.3 kg. Advance diet Up with therapy D/C IV fluids Plan for discharge tomorrow Discharge home with home health  Labs: None DVT Prophylaxis - Lovenox, Foot Pumps and TED hose Weight-Bearing as tolerated to left leg D/C O2 and Pulse OX and try on Room Air Begin working on bowel movement  Dominique Ressel R. Nambe Joplin 11/25/2018, 7:45 AM

## 2018-11-25 NOTE — Care Management (Signed)
Called [facility] at [phone] to call in a verbal order for the Lovenox and get the price for the patient. Arbie Cookey stated that she needs a BIN number, Group Number ID number and PCN number, will call back with that information

## 2018-11-25 NOTE — Evaluation (Signed)
Occupational Therapy Evaluation Patient Details Name: Amber Bridges MRN: [id number] DOB: [birth year] Today's Date: 11/25/2018    History of Present Illness Amber Bridges is a 78yo female who comes to [facility] for elective Left TKA. PMH: Right TKA (10ya), Rt sciatica . PTA pt lived alone performed all IADL, ADL, driving, housework. SPC for AMB outside of house.    Clinical Impression   Pt seen for OT evaluation this date, POD#1 from above surgery. Pt was independent in all ADL prior to surgery, however using RW since January 2/2 sciatica. Pt is eager to return to PLOF with less pain and improved safety and independence. Pt currently requires minimal assist for LB dressing/bathing while in seated position due to pain and limited AROM of L knee. Pt mildly dizzy upon initial sup>sit but with brief rest, it resolved. BP supine 141/49 HR 64; BP sitting 152/50 HR 73; standing BP 158/54 HR 69. Pt denied lightheadedness in standing and able to perform toilet transfer to [facility] with CGA and cues for hand placement, and ambulated approx 15 ft to recliner with CGA and RW. Pt instructed in polar care mgt, falls prevention strategies, home/routines modifications, DME/AE for LB bathing and dressing tasks, pet care considerations, and compression stocking mgt. Pt verbalized understanding. Pt would benefit from skilled OT services including additional instruction in dressing techniques with or without assistive devices for dressing and bathing skills to support recall and carryover prior to discharge and ultimately to maximize safety, independence, and minimize falls risk and caregiver burden. Do not currently anticipate any OT needs following this hospitalization.      Follow Up Recommendations  No OT follow up    Equipment Recommendations  None recommended by OT    Recommendations for Other Services       Precautions / Restrictions Precautions Precautions: Fall;Knee Restrictions Weight Bearing  Restrictions: Yes LLE Weight Bearing: Weight bearing as tolerated      Mobility Bed Mobility Overal bed mobility: Needs Assistance Bed Mobility: Supine to Sit     Supine to sit: Supervision     General bed mobility comments: additional time and effort, no assist  Transfers Overall transfer level: Needs assistance Equipment used: Rolling walker (2 wheeled) Transfers: Sit to/from Stand Sit to Stand: From elevated surface;Min guard         General transfer comment: elevated to approximate bed height at home, pt able to perform with CGA and initial cues for hand/foot placement     Balance Overall balance assessment: Needs assistance;History of Falls Sitting-balance support: No upper extremity supported;Feet supported Sitting balance-Leahy Scale: Good     Standing balance support: Bilateral upper extremity supported Standing balance-Leahy Scale: Fair                             ADL either performed or assessed with clinical judgement   ADL Overall ADL's : Needs assistance/impaired                                       General ADL Comments: CGA to Min A for BSC transfer, Min A for LB ADL, Max A for compression stocking mgt     Vision Baseline Vision/History: Wears glasses Wears Glasses: At all times Patient Visual Report: No change from baseline       Perception     Praxis      Pertinent  Vitals/Pain Pain Assessment: 0-10 Pain Score: 1  Pain Location: L knee with functional mobility Pain Descriptors / Indicators: Aching Pain Intervention(s): Limited activity within patient's tolerance;Monitored during session;Repositioned;Ice applied     Hand Dominance Left   Extremity/Trunk Assessment Upper Extremity Assessment Upper Extremity Assessment: Overall WFL for tasks assessed   Lower Extremity Assessment Lower Extremity Assessment: Overall WFL for tasks assessed;LLE deficits/detail LLE Deficits / Details: expected post-op  strength/ROM deficits in LLE   Cervical / Trunk Assessment Cervical / Trunk Assessment: Normal   Communication Communication Communication: No difficulties   Cognition Arousal/Alertness: Awake/alert Behavior During Therapy: WFL for tasks assessed/performed Overall Cognitive Status: Within Functional Limits for tasks assessed                                     General Comments  polar care and hemovac in place at start and end of session    Exercises Other Exercises Other Exercises: pt instructed in polar care mgt, compression stocking mgt, AE/DME, home/routines modifications, pet care considerations, and falls prevention strategies   Shoulder Instructions      Home Living Family/patient expects to be discharged to:: Private residence Living Arrangements: Alone(plans to stay with DTR at DTR's home at [place] ) Available Help at Discharge: Family(DTR, SIL plan to help) Type of Home: House Home Access: Stairs to enter [employer] of Steps: 5 Entrance Stairs-Rails: Left;Right;Can reach both Home Layout: One level     Bathroom Shower/Tub: Producer, television/film/video: Handicapped height Bathroom Accessibility: Yes   Home Equipment: Environmental consultant - 2 wheels;Cane - single point;Shower seat;Bedside commode;Hand held shower head;Adaptive equipment Adaptive Equipment: Reacher;Sock aid        Prior Functioning/Environment Level of Independence: Independent with assistive device(s)        Comments: SPC in community, driving, grocery shopping; using a RW since January this year d/t acute onst sciatica.         OT Problem List: Decreased strength;Decreased range of motion;Decreased knowledge of use of DME or AE;Pain;Impaired balance (sitting and/or standing)      OT Treatment/Interventions: Self-care/ADL training;Balance training;Therapeutic exercise;Therapeutic activities;DME and/or AE instruction;Patient/family education    OT Goals(Current goals can be  found in the care plan section) Acute Rehab OT Goals Patient Stated Goal: go to daughter's home and recover  OT Goal Formulation: With patient Time For Goal Achievement: 12/09/18 Potential to Achieve Goals: Good ADL Goals Pt Will Transfer to Toilet: with supervision;ambulating(LRAD for amb, comfort height toilet) Additional ADL Goal #1: Pt will independently instruct family in compression stocking mgt including wear schedule, donning/doffing, and positioning. Additional ADL Goal #2: Pt will independently instruct family in polar care mgt including wear schedule, donning/doffing, and positioning.  OT Frequency: Min 1X/week   Barriers to D/C:            Co-evaluation              AM-PAC OT "6 Clicks" Daily Activity     Outcome Measure Help from another person eating meals?: None Help from another person taking care of personal grooming?: None Help from another person toileting, which includes using toliet, bedpan, or urinal?: A Little Help from another person bathing (including washing, rinsing, drying)?: A Little Help from another person to put on and taking off regular upper body clothing?: None Help from another person to put on and taking off regular lower body clothing?: A Little 6 Click Score: 21  End of Session Equipment Utilized During Treatment: Gait belt;Rolling walker  Activity Tolerance: Patient tolerated treatment well Patient left: in chair;with call bell/phone within reach;with chair alarm set;with SCD's reapplied;Other (comment)(polar care in place, rolled towel under L ankle)  OT Visit Diagnosis: Other abnormalities of gait and mobility (R26.89);Pain;History of falling (Z91.81) Pain - Right/Left: Left Pain - part of body: Knee                Time: 1610-9604 OT Time Calculation (min): 43 min Charges:  OT General Charges $OT Visit: 1 Visit OT Evaluation $OT Eval Low Complexity: 1 Low OT Treatments $Self Care/Home Management : 23-37 mins  Richrd Prime,  MPH, MS, OTR/L ascom [phone] 11/25/18, 10:51 AM

## 2018-11-25 NOTE — Progress Notes (Signed)
Physical Therapy Treatment Patient Details Name: Amber Bridges MRN: [id number] DOB: September 11, 1941 Today's Date: 11/25/2018    History of Present Illness Rilee "Peggy" Lamprecht is a 78yo female who comes to [facility] for elective Left TKA. PMH: Right TKA (10ya), Rt sciatica . PTA pt lived alone performed all IADL, ADL, driving, housework. SPC for AMB outside of house.     PT Comments    Pt presents with deficits in strength, transfers, mobility, gait, balance, L knee ROM, and activity tolerance.  Pt required extra time and effort but no physical assistance during sup to sit but required min A for BLEs into bed during sit to sup.  Pt was steady during transfers with good eccentric and concentric control.  Pt was able to amb 15 feet near the EOB with c/o mild dizziness that improved when returned to sitting.  Pt's BP taken in sitting after amb at 130/55.  Pt was steady with amb with a RW with no L knee buckling.  Pt will benefit from HHPT services upon discharge to safely address above deficits for decreased caregiver assistance and eventual return to PLOF.     Follow Up Recommendations  Home health PT;Follow surgeon's recommendation for DC plan and follow-up therapies     Equipment Recommendations  None recommended by PT    Recommendations for Other Services       Precautions / Restrictions Precautions Precautions: Fall;Knee Restrictions Weight Bearing Restrictions: Yes LLE Weight Bearing: Weight bearing as tolerated  Pt able to perform 10 Ind LLE SLRs without extensor lag, no KI required   Mobility  Bed Mobility Overal bed mobility: Needs Assistance Bed Mobility: Supine to Sit;Sit to Supine     Supine to sit: Supervision Sit to supine: Min assist   General bed mobility comments: Min A for BLEs into bed during sit to sup but no assistance required during sup to sit  Transfers Overall transfer level: Needs assistance Equipment used: Rolling walker (2 wheeled) Transfers: Sit  to/from Stand Sit to Stand: From elevated surface;Min guard         General transfer comment: Min verbal and visual cues for proper sequencing with transfers  Ambulation/Gait Ambulation/Gait assistance: Min guard Gait Distance (Feet): 15 Feet Assistive device: Rolling walker (2 wheeled) Gait Pattern/deviations: Step-to pattern Gait velocity: decreased   General Gait Details: Pt ambulated near the EOB this session secondary to c/o minor dizziness with BP taken in sitting at 130/55 mmHg   Stairs             Wheelchair Mobility    Modified Rankin (Stroke Patients Only)       Balance Overall balance assessment: Needs assistance;History of Falls Sitting-balance support: No upper extremity supported;Feet supported Sitting balance-Leahy Scale: Good     Standing balance support: Bilateral upper extremity supported Standing balance-Leahy Scale: Fair                              Cognition Arousal/Alertness: Awake/alert Behavior During Therapy: WFL for tasks assessed/performed Overall Cognitive Status: Within Functional Limits for tasks assessed                                        Exercises Total Joint Exercises Ankle Circles/Pumps: AROM;Both;15 reps Quad Sets: AROM;Strengthening;Left;10 reps Hip ABduction/ADduction: Left;10 reps;AROM Straight Leg Raises: AROM;Left;10 reps Long Arc Quad: AROM;Strengthening;Left;10 reps;15 reps Knee Flexion:  AROM;Strengthening;Left;10 reps;15 reps Goniometric ROM: L knee AROM: 0-52 deg Marching in Standing: AROM;Both;10 reps Other Exercises Other Exercises: HEP education review per handout Other Exercises: Car transfer sequencing education provided verbally Other Exercises: Positioning education provided    General Comments General comments (skin integrity, edema, etc.): polar care and hemovac in place at start and end of session      Pertinent Vitals/Pain Pain Assessment: No/denies pain Pain  Score: 1  Pain Location: L knee with functional mobility Pain Descriptors / Indicators: Aching Pain Intervention(s): Limited activity within patient's tolerance;Monitored during session;Repositioned;Ice applied    Home Living Family/patient expects to be discharged to:: Private residence Living Arrangements: Alone(plans to stay with DTR at DTR's home at [place] ) Available Help at Discharge: Family(DTR, SIL plan to help) Type of Home: House Home Access: Stairs to enter Entrance Stairs-Rails: Left;Right;Can reach both Home Layout: One level Home Equipment: Environmental consultant - 2 wheels;Cane - single point;Shower seat;Bedside commode;Hand held shower head;Adaptive equipment      Prior Function Level of Independence: Independent with assistive device(s)      Comments: SPC in community, driving, grocery shopping; using a RW since January this year d/t acute onst sciatica.    PT Goals (current goals can now be found in the care plan section) Acute Rehab PT Goals Patient Stated Goal: go to daughter's home and recover     Frequency    BID      PT Plan Current plan remains appropriate    Co-evaluation              AM-PAC PT "6 Clicks" Mobility   Outcome Measure  Help needed turning from your back to your side while in a flat bed without using bedrails?: A Little Help needed moving from lying on your back to sitting on the side of a flat bed without using bedrails?: A Little Help needed moving to and from a bed to a chair (including a wheelchair)?: A Little Help needed standing up from a chair using your arms (e.g., wheelchair or bedside chair)?: A Little Help needed to walk in hospital room?: A Little Help needed climbing 3-5 steps with a railing? : A Lot 6 Click Score: 17    End of Session Equipment Utilized During Treatment: Gait belt Activity Tolerance: Other (comment)(Pt mildly dizzy in standing) Patient left: in bed;with call bell/phone within reach;with bed alarm set;with SCD's  reapplied;Other (comment)(Polar care donned to L knee) Nurse Communication: Mobility status PT Visit Diagnosis: Unsteadiness on feet (R26.81);Other abnormalities of gait and mobility (R26.89);Difficulty in walking, not elsewhere classified (R26.2)     Time: 3244-0102 PT Time Calculation (min) (ACUTE ONLY): 39 min  Charges:  $Gait Training: 8-22 mins $Therapeutic Exercise: 8-22 mins $Therapeutic Activity: 8-22 mins                     D. Scott Alaynah Schutter PT, DPT 11/25/18, 12:03 PM

## 2018-11-25 NOTE — Plan of Care (Signed)

## 2018-11-25 NOTE — Care Management (Signed)
The patient does not have her insurance card with her, she stated that she would have her daughter call the pharmacy with the needed information, I let her know that they need the BIN #, PCN # Group ID# and ID#, she stated she would call her.

## 2018-11-25 NOTE — Discharge Summary (Addendum)
Physician Discharge Summary  Patient ID: Amber Bridges MRN: [id number] DOB/AGE: 78-Jan-1942 78 y.o.  Admit date: 11/24/2018 Discharge date: 11/27/2018  Admission Diagnoses:  PRIMARY OSTEOARTHRITIS OF LEFT KNEE   Discharge Diagnoses: Patient Active Problem List   Diagnosis Date Noted  . Mild episode of recurrent major depressive disorder (HCC) 11/24/2018  . Total knee replacement status 11/24/2018  . Bilateral cataracts 10/15/2018  . Situational anxiety 09/08/2018  . Primary osteoarthritis of left knee 08/03/2018  . Hyperlipidemia 03/10/2018  . Essential hypertension 03/10/2018  . Bilateral carotid artery stenosis 03/10/2018  . Status post total right knee replacement 02/05/2016  . Borderline diabetes mellitus 01/05/2016  . Cauda equina compression (HCC) 04/27/2015  . History of depression 04/27/2015  . 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  . Adiposity 04/27/2015  . Paraparesis of both lower limbs (HCC) 04/27/2015  . Sleep apnea 04/27/2015  . Urinary retention 04/27/2015    Past Medical History:  Diagnosis Date  . Arthritis   . Bronchitis   . Coronary artery disease   . Depression   . HOH (hard of hearing)    AIDS  . Hypertension   . Sleep apnea    CPAP     Transfusion: No transfusions during this admission   Consultants (if any):   Discharged Condition: Improved  Hospital Course: Amber Bridges is an 78 y.o. female who was admitted 11/24/2018 with a diagnosis of degenerative arthrosis left knee and went to the operating room on 11/24/2018 and underwent the above named procedures.    Surgeries:Procedure(s): COMPUTER ASSISTED TOTAL KNEE ARTHROPLASTY-LEFT on 11/24/2018  PRE-OPERATIVE DIAGNOSIS: Degenerative arthrosis of the left knee, primary  POST-OPERATIVE DIAGNOSIS:  Same  PROCEDURE:  Left total knee arthroplasty using computer-assisted navigation  SURGEON:  Jena Gauss. M.D.  ASSISTANT: Benancio Deeds, PA-C  (present and scrubbed throughout the case, critical for assistance with exposure, retraction, instrumentation, and closure)  ANESTHESIA: general  ESTIMATED BLOOD LOSS: 50 mL  FLUIDS REPLACED: 1300 mL of crystalloid  TOURNIQUET TIME: 116 minutes  DRAINS: 2 medium Hemovac drains  SOFT TISSUE RELEASES: Anterior cruciate ligament, posterior cruciate ligament, deep medial collateral ligament, patellofemoral ligament  IMPLANTS UTILIZED: DePuy PFC Sigma size 3 posterior stabilized femoral component (cemented), size 3 MBT tibial component (cemented), 35 mm 3 peg oval dome patella (cemented), and a 10 mm stabilized rotating platform polyethylene insert.  INDICATIONS FOR SURGERY: Amber Bridges is a 78 y.o. year old female with a long history of progressive knee pain. X-rays demonstrated severe degenerative changes in tricompartmental fashion. The patient had not seen any significant improvement despite conservative nonsurgical intervention. After discussion of the risks and benefits of surgical intervention, the patient expressed understanding of the risks benefits and agree with plans for total knee arthroplasty.   The risks, benefits, and alternatives were discussed at length including but not limited to the risks of infection, bleeding, nerve injury, stiffness, blood clots, the need for revision surgery, cardiopulmonary complications, among others, and they were willing to proceed. Patient tolerated the surgery well. No complications .Patient was taken to PACU where she was stabilized and then transferred to the orthopedic floor.  Patient started on Lovenox 30 mg q 12 hrs. Foot pumps applied bilaterally at 80 mm hgb. Heels elevated off bed with rolled towels. No evidence of DVT. Calves non tender. Negative Homan. Physical therapy started on day #1 for gait training and transfer with OT starting on  day #1 for ADL and assisted devices. Patient has done well with therapy.  Ambulated 60  feet upon being discharged.    Patient's IV And Foley were discontinued on day #1 with Hemovac being discontinued on day #2. Dressing was changed on day 3 prior to patient being discharged.  The patient went to [facility].   She was given perioperative antibiotics:  Anti-infectives (From admission, onward)   Start     Dose/Rate Route Frequency Ordered Stop   11/24/18 1400  clindamycin (CLEOCIN) IVPB 600 mg     600 mg 100 mL/hr over 30 Minutes Intravenous Every 6 hours 11/24/18 1219 11/25/18 0919   11/24/18 1019  gentamicin (GARAMYCIN) 80 mg in sodium chloride 0.9 % 500 mL irrigation  Status:  Discontinued       As needed 11/24/18 1020 11/24/18 1114   11/24/18 0600  clindamycin (CLEOCIN) IVPB 900 mg     900 mg 100 mL/hr over 30 Minutes Intravenous On call to O.R. 11/23/18 2221 11/24/18 0752   11/24/18 0549  clindamycin (CLEOCIN) 900 MG/50ML IVPB    Note to Pharmacy:  Vanessa Ralphs   : cabinet override      11/24/18 0549 11/24/18 0740    .  She was fitted with AV 1 compression foot pump devices, instructed on heel pumps, early ambulation, and fitted with TED stockings bilaterally for DVT prophylaxis.  She benefited maximally from the hospital stay and there were no complications.    Recent vital signs:  Vitals:   11/26/18 0847 11/27/18 0029  BP: (!) 151/51 (!) 146/55  Pulse: 79 74  Resp: 18 17  Temp: 98.4 F (36.9 C) 98.2 F (36.8 C)  SpO2: 94% 98%    Recent laboratory studies:  Lab Results  Component Value Date   HGB 13.4 11/12/2018   HGB 13.2 04/27/2015   HGB 12.9 09/11/2012   Lab Results  Component Value Date   WBC 5.9 11/12/2018   PLT 251 11/12/2018   Lab Results  Component Value Date   INR 0.96 11/12/2018   Lab Results  Component Value Date   NA 137 11/12/2018   K 4.0 11/12/2018   CL 104 11/12/2018   CO2 23 11/12/2018   BUN 22 11/12/2018   CREATININE 1.27 (H) 11/12/2018   GLUCOSE 105 (H) 11/12/2018    Discharge Medications:   Allergies as of  11/27/2018      Reactions   Flu Virus Vaccine Other (See Comments), Swelling   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 Can take flu vaccine that does not relate to eggs Site of flu vaccine. Can take flu vaccine that does not relate to eggs   Penicillins Diarrhea   Has patient had a PCN reaction causing immediate rash, facial/tongue/throat swelling, SOB or lightheadedness with hypotension: No Has patient had a PCN reaction causing severe rash involving mucus membranes or skin necrosis: No Has patient had a PCN reaction that required hospitalization No Has patient had a PCN reaction occurring within the last 10 years: No If all of the above answers are "NO", then may proceed with Cephalosporin use.   Egg White [albumen, Egg] Nausea Only   Lisinopril Cough      Medication List    STOP taking these medications   aspirin EC 81 MG tablet   ibuprofen 200 MG tablet Commonly known as:  ADVIL,MOTRIN   meloxicam 7.5 MG tablet Commonly known as:  MOBIC   naproxen sodium 220 MG tablet Commonly known as:  ALEVE     TAKE these medications   amLODipine 5 MG  tablet Commonly known as:  NORVASC Take 1 tablet by mouth daily.   amoxicillin 500 MG capsule Commonly known as:  AMOXIL Take 2,000 mg by mouth daily as needed (dental procedure).   atorvastatin 40 MG tablet Commonly known as:  LIPITOR Take 1 tablet by mouth at bedtime.   CENTRUM SILVER PO Take 1 tablet by mouth daily.   enoxaparin 40 MG/0.4ML injection Commonly known as:  LOVENOX Inject 0.4 mLs (40 mg total) into the skin daily for 14 days.   FLUoxetine 40 MG capsule Commonly known as:  PROZAC Take 1 capsule by mouth daily.   fluticasone 50 MCG/ACT nasal spray Commonly known as:  FLONASE Place 1-2 sprays into both nostrils daily as needed for allergies or rhinitis.   hydrochlorothiazide 25 MG tablet Commonly known as:  HYDRODIURIL Take 1 tablet by mouth every evening.   losartan 50 MG  tablet Commonly known as:  COZAAR Take 1 tablet by mouth every evening.   methocarbamol 500 MG tablet Commonly known as:  ROBAXIN Take 1 tablet by mouth 3 (three) times daily.   MIRALAX PO Take 17 g by mouth daily as needed (constipation).   senna 8.6 MG tablet Commonly known as:  SENOKOT Take 1 tablet by mouth daily.   SLEEP AID PO Take 1 tablet by mouth at bedtime as needed (sleep).   traMADol 50 MG tablet Commonly known as:  ULTRAM Take 1-2 tablets (50-100 mg total) by mouth every 4 (four) hours as needed for moderate pain.   traZODone 50 MG tablet Commonly known as:  DESYREL Take 1 tablet by mouth at bedtime as needed for sleep.            Durable Medical Equipment  (From admission, onward)         Start     Ordered   11/24/18 1220  DME Walker rolling  Once    Question:  Patient needs a walker to treat with the following condition  Answer:  Total knee replacement status   11/24/18 1219   11/24/18 1220  DME Bedside commode  Once    Question:  Patient needs a bedside commode to treat with the following condition  Answer:  Total knee replacement status   11/24/18 1219          Diagnostic Studies: Dg Knee Left Port  Result Date: 11/24/2018 CLINICAL DATA:  Status post left total knee replacement. EXAM: PORTABLE LEFT KNEE - 1-2 VIEW COMPARISON:  Radiographs of March 20, 2016. FINDINGS: The femoral and tibial components appear to be well situated. Expected postoperative changes are seen in the soft tissues anteriorly including surgical drain. IMPRESSION: Status post left total knee arthroplasty. Electronically Signed   By: Lupita Raider, M.D.   On: 11/24/2018 11:39    Disposition: Discharge disposition: 03-Skilled Nursing Facility       Discharge Instructions    Increase activity slowly   Complete by:  As directed    Increase activity slowly   Complete by:  As directed        Contact information for follow-up providers    Tera Partridge, PA On  12/09/2018.   Specialty:  Physician Assistant Why:  at 10:15am Contact information: [address] [place] [address] [phone]        Donato Heinz, MD On 01/06/2019.   Specialty:  Orthopedic Surgery Why:  at 10:45am Contact information: [address] [facility] [place] [address] [phone]  Contact information for after-discharge care    Destination    [facility] .   Service:  Skilled Nursing Contact information: [address] [place] [address] [phone]                   Signed: Lenard Forth, Debera Sterba 11/27/2018, 6:01 AM

## 2018-11-25 NOTE — Care Management Note (Signed)
Case Management Note  Patient Details  Name: Tecora Elizabeth Hellberg MRN: [id number] Date of Birth: 08/21/1941  Subjective/Objective:                  Met with the patient to discuss DC plan and needs The HH list was provided per CMS.gov [facility]  Has all of the DME at home she needs including RW, BSC, shower chair and cane Member has son and son in law and daughter to provide transportation when needed Patient will be staying at her daughters house until the 28th Notified Teresa with [facility] of the choice and the address that the patient will be stayin at  Pharmacy is [facility] [address] PCP is [place]    Action/Plan: HH list provided, chooses [facility], notified Teresa, no DME needs  Expected Discharge Date:                  Expected Discharge Plan:     In-House Referral:     Discharge planning Services  CM Consult  Post Acute Care Choice:  Home Health Choice offered to:     DME Arranged:    DME Agency:     HH Arranged:  PT HH Agency:     Status of Service:  In process, will continue to follow  If discussed at Long Length of Stay Meetings, dates discussed:    Additional Comments:  Deliliah J Gregory, RN 11/25/2018, 11:09 AM  

## 2018-11-26 NOTE — [place] FL2 (Signed)
[place] MEDICAID FL2 LEVEL OF CARE SCREENING TOOL     IDENTIFICATION  Patient Name: Amber Bridges Birthdate: [birth year] Sex: female Admission Date (Current Location): 11/24/2018  [place] and [place] Number:  Chiropodist and Address:  [facility], [address] [place], [place] [address]      Provider Number: [id number]  Attending Physician Name and Address:  Donato Heinz, MD  Relative Name and Phone Number:       Current Level of Care: Hospital Recommended Level of Care: Skilled Nursing Facility Prior Approval Number:    Date Approved/Denied:   PASRR Number: [id number])  Discharge Plan: SNF    Current Diagnoses: Patient Active Problem List   Diagnosis Date Noted  . Mild episode of recurrent major depressive disorder (HCC) 11/24/2018  . Total knee replacement status 11/24/2018  . Bilateral cataracts 10/15/2018  . Situational anxiety 09/08/2018  . Primary osteoarthritis of left knee 08/03/2018  . Hyperlipidemia 03/10/2018  . Essential hypertension 03/10/2018  . Bilateral carotid artery stenosis 03/10/2018  . Status post total right knee replacement 02/05/2016  . Borderline diabetes mellitus 01/05/2016  . Cauda equina compression (HCC) 04/27/2015  . History of depression 04/27/2015  . 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  . Adiposity 04/27/2015  . Paraparesis of both lower limbs (HCC) 04/27/2015  . Sleep apnea 04/27/2015  . Urinary retention 04/27/2015    Orientation RESPIRATION BLADDER Height & Weight     Self, Time, Situation, Place  Normal Continent Weight: 234 lb 5 oz (106.3 kg) Height:  5' 6.5" (168.9 cm)  BEHAVIORAL SYMPTOMS/MOOD NEUROLOGICAL BOWEL NUTRITION STATUS      Continent Diet(Diet: Regular )  AMBULATORY STATUS COMMUNICATION OF NEEDS Skin   Extensive Assist Verbally Surgical wounds(Incision: Left Knee. )                       Personal Care Assistance Level of Assistance   Bathing, Feeding, Dressing Bathing Assistance: Limited assistance Feeding assistance: Independent Dressing Assistance: Limited assistance     Functional Limitations Info  Sight, Hearing, Speech Sight Info: Adequate Hearing Info: Adequate Speech Info: Adequate    SPECIAL CARE FACTORS FREQUENCY  PT (By licensed PT), OT (By licensed OT)     PT Frequency: (5) OT Frequency: (5)            Contractures      Additional Factors Info  Code Status, Allergies Code Status Info: (Full Code. ) Allergies Info: (Flu Virus Vaccine, Penicillins, Egg White Albumen, Egg, Lisinopril)           Current Medications (11/26/2018):  This is the current hospital active medication list Current Facility-Administered Medications  Medication Dose Route Frequency Provider Last Rate Last Dose  . 0.9 %  sodium chloride infusion   Intravenous Continuous Hooten, Illene Labrador, MD   Stopped at 11/25/18 1122  . acetaminophen (TYLENOL) tablet 325-650 mg  325-650 mg Oral Q6H PRN Hooten, Illene Labrador, MD      . alum & mag hydroxide-simeth (MAALOX/MYLANTA) 200-200-20 MG/5ML suspension 30 mL  30 mL Oral Q4H PRN Hooten, Illene Labrador, MD      . amLODipine (NORVASC) tablet 5 mg  5 mg Oral Daily Hooten, Illene Labrador, MD   5 mg at 11/25/18 0843  . atorvastatin (LIPITOR) tablet 40 mg  40 mg Oral QHS Hooten, Illene Labrador, MD   40 mg at 11/25/18 2148  . bisacodyl (DULCOLAX) suppository 10 mg  10 mg Rectal Daily PRN Francesco Sor  P, MD      . celecoxib (CELEBREX) capsule 200 mg  200 mg Oral BID Donato Heinz, MD   200 mg at 11/25/18 2149  . diphenhydrAMINE (BENADRYL) 12.5 MG/5ML elixir 12.5-25 mg  12.5-25 mg Oral Q4H PRN Hooten, Illene Labrador, MD      . doxylamine (Sleep) (UNISOM) tablet 25 mg  25 mg Oral QHS PRN Hooten, Illene Labrador, MD      . enoxaparin (LOVENOX) injection 30 mg  30 mg Subcutaneous Q12H Hooten, Illene Labrador, MD   30 mg at 11/25/18 2023  . ferrous sulfate tablet 325 mg  325 mg Oral BID WC Hooten, Illene Labrador, MD   325 mg at 11/25/18 1613  .  FLUoxetine (PROZAC) capsule 40 mg  40 mg Oral Daily Hooten, Illene Labrador, MD   40 mg at 11/25/18 0843  . fluticasone (FLONASE) 50 MCG/ACT nasal spray 1-2 spray  1-2 spray Each Nare Daily PRN Hooten, Illene Labrador, MD      . gabapentin (NEURONTIN) capsule 300 mg  300 mg Oral QHS Hooten, Illene Labrador, MD   300 mg at 11/25/18 2148  . hydrochlorothiazide (HYDRODIURIL) tablet 25 mg  25 mg Oral QPM Hooten, Illene Labrador, MD   25 mg at 11/25/18 1613  . HYDROmorphone (DILAUDID) injection 0.5-1 mg  0.5-1 mg Intravenous Q4H PRN Hooten, Illene Labrador, MD      . losartan (COZAAR) tablet 50 mg  50 mg Oral QPM Hooten, Illene Labrador, MD   50 mg at 11/25/18 1614  . magnesium hydroxide (MILK OF MAGNESIA) suspension 30 mL  30 mL Oral Daily Hooten, Illene Labrador, MD   30 mL at 11/25/18 0844  . menthol-cetylpyridinium (CEPACOL) lozenge 3 mg  1 lozenge Oral PRN Hooten, Illene Labrador, MD       Or  . phenol (CHLORASEPTIC) mouth spray 1 spray  1 spray Mouth/Throat PRN Hooten, Illene Labrador, MD      . methocarbamol (ROBAXIN) tablet 500 mg  500 mg Oral TID Donato Heinz, MD   500 mg at 11/25/18 2149  . metoCLOPramide (REGLAN) tablet 5-10 mg  5-10 mg Oral Q8H PRN Hooten, Illene Labrador, MD       Or  . metoCLOPramide (REGLAN) injection 5-10 mg  5-10 mg Intravenous Q8H PRN Hooten, Illene Labrador, MD      . metoCLOPramide (REGLAN) tablet 10 mg  10 mg Oral TID AC & HS Hooten, Illene Labrador, MD   10 mg at 11/25/18 2149  . multivitamin with minerals tablet 1 tablet  1 tablet Oral Daily Hooten, Illene Labrador, MD   1 tablet at 11/25/18 0840  . ondansetron (ZOFRAN) tablet 4 mg  4 mg Oral Q6H PRN Hooten, Illene Labrador, MD       Or  . ondansetron (ZOFRAN) injection 4 mg  4 mg Intravenous Q6H PRN Hooten, Illene Labrador, MD      . oxyCODONE (Oxy IR/ROXICODONE) immediate release tablet 10 mg  10 mg Oral Q4H PRN Donato Heinz, MD   10 mg at 11/25/18 2154  . oxyCODONE (Oxy IR/ROXICODONE) immediate release tablet 5 mg  5 mg Oral Q4H PRN Hooten, Illene Labrador, MD      . pantoprazole (PROTONIX) EC tablet 40 mg  40 mg Oral BID  Donato Heinz, MD   40 mg at 11/25/18 2148  . senna-docusate (Senokot-S) tablet 1 tablet  1 tablet Oral BID Donato Heinz, MD   1 tablet at 11/25/18 2149  . sodium phosphate (FLEET) 7-19 GM/118ML enema  1 enema  1 enema Rectal Once PRN Hooten, Illene Labrador, MD      . traMADol Janean Sark) tablet 50-100 mg  50-100 mg Oral Q4H PRN Hooten, Illene Labrador, MD      . traZODone (DESYREL) tablet 50 mg  50 mg Oral QHS PRN Hooten, Illene Labrador, MD         Discharge Medications: Please see discharge summary for a list of discharge medications.  Relevant Imaging Results:  Relevant Lab Results:   Additional Information (SSN: [id number])  Jasmia Angst, Darleen Crocker, LCSW

## 2018-11-26 NOTE — Progress Notes (Signed)
   Subjective: 2 Days Post-Op Procedure(s) (LRB): COMPUTER ASSISTED TOTAL KNEE ARTHROPLASTY-LEFT (Left) Patient reports pain as mild.   Patient is well, and has had no acute complaints or problems Patient was not able to do a lot yesterday with therapy.  States her knee Buckling.  Was able to ambulate 60 feet.  May need to use knee immobilizer only when ambulating Plan is to go Rehab after hospital stay. no nausea and no vomiting Patient denies any chest pains or shortness of breath. Objective: Vital signs in last 24 hours: Temp:  [97.4 F (36.3 C)-98.4 F (36.9 C)] 98.4 F (36.9 C) (02/19 0029) Pulse Rate:  [65-76] 70 (02/19 0029) Resp:  [17-18] 17 (02/19 0029) BP: (130-153)/(44-61) 140/44 (02/19 0029) SpO2:  [94 %-98 %] 94 % (02/19 0029) well approximated incision Heels are non tender and elevated off the bed using rolled towels Intake/Output from previous day: 02/18 0701 - 02/19 0700 In: 1228.5 [P.O.:720; I.V.:508.5] Out: 821 [Urine:600; Drains:220; Stool:1] Intake/Output this shift: No intake/output data recorded.  No results for input(s): HGB in the last 72 hours. No results for input(s): WBC, RBC, HCT, PLT in the last 72 hours. No results for input(s): NA, K, CL, CO2, BUN, CREATININE, GLUCOSE, CALCIUM in the last 72 hours. No results for input(s): LABPT, INR in the last 72 hours.  EXAM General - Patient is Alert, Appropriate and Oriented Extremity - Neurologically intact Neurovascular intact Sensation intact distally Intact pulses distally Dorsiflexion/Plantar flexion intact No cellulitis present Compartment soft Dressing - dressing C/D/I Motor Function - intact, moving foot and toes well on exam.    Past Medical History:  Diagnosis Date  . Arthritis   . Bronchitis   . Coronary artery disease   . Depression   . HOH (hard of hearing)    AIDS  . Hypertension   . Sleep apnea    CPAP    Assessment/Plan: 2 Days Post-Op Procedure(s) (LRB): COMPUTER  ASSISTED TOTAL KNEE ARTHROPLASTY-LEFT (Left) Active Problems:   Total knee replacement status  Estimated body mass index is 37.25 kg/m as calculated from the following:   Height as of this encounter: 5' 6.5" (1.689 m).   Weight as of this encounter: 106.3 kg. Up with therapy Discharge to SNF when bed available  Labs: None DVT Prophylaxis - Lovenox, Foot Pumps and TED hose Weight-Bearing as tolerated to left leg Hemovac discontinued today.  Into the drain appeared to be intact. Please wash operative leg, change dressing apply TED stockings to both legs prior to discharge  Abygale Karpf R. Fowler [place] 11/26/2018, 7:52 AM

## 2018-11-26 NOTE — Clinical Social Work Note (Signed)
Clinical Social Work Assessment  Patient Details  Name: Amber Bridges MRN: [id number] Date of Birth: 09/24/1941  Date of referral:  11/26/18               Reason for consult:  Facility Placement                Permission sought to share information with:  Facility Contact Representative Permission granted to share information::  Yes, Verbal Permission Granted  Name::      Skilled Nursing Facility   Agency::   [facility]   Relationship::     Contact Information:     Housing/Transportation Living arrangements for the past 2 months:  Single Family Home Source of Information:  Patient, Adult Children Patient Interpreter Needed:  None Criminal Activity/Legal Involvement Pertinent to Current Situation/Hospitalization:  No - Comment as needed Significant Relationships:  Adult Children Lives with:  Self Do you feel safe going back to the place where you live?  Yes Need for family participation in patient care:  Yes (Comment)  Care giving concerns:  Patient lives alone in [place].    Social Worker assessment / plan:  Clinical Social Worker (CSW) received verbal consult from RN case manager that PT has changed their recommendation from home health to SNF. CSW met with patient alone at bedside to discuss D/C plan. Patient was alert and oriented X4 and sitting up in the chair at bedside. CSW introduced self and explained role of CSW department. Per patient she lives alone in [place] and her daughter Amber Bridges is very supportive. CSW explained SNF process and that [employer] will have to approve SNF. Patient is agreeable to SNF search in [place]. FL2 complete and faxed out.   CSW presented bed offers to patient and discussed the quality measures of the facilities. Patient chose [facility]. Per [name] admissions coordinator at [facility] she will start [employer] SNF authorization today. Per patient she has her own c-pap from home at bedside that will go with her to [facility]. CSW contacted  patient's daughter Amber Bridges and made her aware of above. Amber Bridges is in agreement with D/C plan. CSW will continue to follow and assist as needed.    Employment status:  Retired Insurance information:  Managed Medicare PT Recommendations:  Skilled Nursing Facility Information / Referral to community resources:  Skilled Nursing Facility  Patient/Family's Response to care:  Patient chose [facility].   Patient/Family's Understanding of and Emotional Response to Diagnosis, Current Treatment, and Prognosis:  Patient and her daughter were very pleasant and thanked CSW for assistance.   Emotional Assessment Appearance:  Appears stated age Attitude/Demeanor/Rapport:    Affect (typically observed):  Accepting, Adaptable, Pleasant Orientation:  Oriented to Self, Oriented to Place, Oriented to  Time, Oriented to Situation Alcohol / Substance use:  Not Applicable Psych involvement (Current and /or in the community):  No (Comment)  Discharge Needs  Concerns to be addressed:  Discharge Planning Concerns Readmission within the last 30 days:  No Current discharge risk:  Dependent with Mobility Barriers to Discharge:  Continued Medical Work up   Demeco Ducksworth M, LCSW 11/26/2018, 2:53 PM  

## 2018-11-26 NOTE — Progress Notes (Signed)
Physical Therapy Treatment Patient Details Name: Amber Bridges MRN: [id number] DOB: [birth year] Today's Date: 11/26/2018    History of Present Illness Amber Bridges is a 78 yo female who comes to [facility] for elective Left TKA. PMH: Right TKA (10ya), Rt sciatica . PTA pt lived alone performed all IADL, ADL, driving, housework. SPC for AMB outside of house.     PT Comments    Pt presents with deficits in strength, transfers, mobility, gait, balance, L knee ROM, and activity tolerance.  Pt required min A for BLE management during sit to sup and CGA with cues for sequencing with transfers.  Pt provided with +2 CGA this session during amb secondary to L knee buckling during gait previous session.  Pt was able to amb 20' with a RW with step-to pattern and slow, cautious cadence but had no instances of L knee buckling this session.  After amb 20' pt requested to end session secondary to nausea, nursing notified.  Pt will benefit from PT services in a SNF setting upon discharge to safely address above deficits for decreased caregiver assistance and eventual return to PLOF.     Follow Up Recommendations  SNF;Supervision for mobility/OOB     Equipment Recommendations  None recommended by PT    Recommendations for Other Services       Precautions / Restrictions Precautions Precautions: Fall;Knee Restrictions Weight Bearing Restrictions: Yes LLE Weight Bearing: Weight bearing as tolerated    Mobility  Bed Mobility Overal bed mobility: Needs Assistance Bed Mobility: Sit to Supine       Sit to supine: Min assist   General bed mobility comments: Min A for BLEs into bed  Transfers Overall transfer level: Needs assistance Equipment used: Rolling walker (2 wheeled) Transfers: Sit to/from Stand Sit to Stand: From elevated surface;Min guard         General transfer comment: Min verbal and visual cues for proper sequencing with transfers  Ambulation/Gait Ambulation/Gait  assistance: Min guard;+2 safety/equipment Gait Distance (Feet): 20 Feet Assistive device: Rolling walker (2 wheeled) Gait Pattern/deviations: Step-to pattern;Decreased stance time - left;Decreased step length - right;Antalgic;Trunk flexed Gait velocity: decreased   General Gait Details: Step-to pattern with slow, cautious cadence with +2 CGA for safety secondary to L knee buckling previous session but no L knee buckling this session.  Pt limited by nausea with amb, nsg notified.    Stairs             Wheelchair Mobility    Modified Rankin (Stroke Patients Only)       Balance Overall balance assessment: Needs assistance;History of Falls Sitting-balance support: No upper extremity supported;Feet supported Sitting balance-Leahy Scale: Good     Standing balance support: Bilateral upper extremity supported Standing balance-Leahy Scale: Fair                              Cognition Arousal/Alertness: Awake/alert Behavior During Therapy: WFL for tasks assessed/performed Overall Cognitive Status: Within Functional Limits for tasks assessed                                        Exercises Total Joint Exercises Ankle Circles/Pumps: AROM;Both;15 reps;10 reps Quad Sets: AROM;Strengthening;Left;10 reps;15 reps Gluteal Sets: Strengthening;Both;10 reps Long Arc Quad: AROM;Strengthening;Left;10 reps;15 reps Knee Flexion: AROM;Strengthening;Left;10 reps;15 reps Goniometric ROM: L knee AROM: 2-65 deg limited by pain Marching in  Standing: AROM;Both;10 reps;Standing Other Exercises Other Exercises: HEP education review per handout    General Comments        Pertinent Vitals/Pain Pain Assessment: 0-10 Pain Score: 1  Pain Location: L knee Pain Intervention(s): Premedicated before session;Monitored during session    Home Living                      Prior Function            PT Goals (current goals can now be found in the care plan  section)      Frequency    BID      PT Plan Current plan remains appropriate    Co-evaluation              AM-PAC PT "6 Clicks" Mobility   Outcome Measure  Help needed turning from your back to your side while in a flat bed without using bedrails?: A Little Help needed moving from lying on your back to sitting on the side of a flat bed without using bedrails?: A Little Help needed moving to and from a bed to a chair (including a wheelchair)?: A Little Help needed standing up from a chair using your arms (e.g., wheelchair or bedside chair)?: A Little Help needed to walk in hospital room?: A Little Help needed climbing 3-5 steps with a railing? : A Lot 6 Click Score: 17    End of Session Equipment Utilized During Treatment: Gait belt Activity Tolerance: Other (comment)(Limited by nausea) Patient left: in bed;with bed alarm set;with call bell/phone within reach;with SCD's reapplied;Other (comment)(Polar care to L knee) Nurse Communication: Mobility status;Other (comment)(pt c/o nausea) PT Visit Diagnosis: Unsteadiness on feet (R26.81);Other abnormalities of gait and mobility (R26.89);Difficulty in walking, not elsewhere classified (R26.2)     Time: 1610-9604 PT Time Calculation (min) (ACUTE ONLY): 29 min  Charges:  $Gait Training: 8-22 mins $Therapeutic Exercise: 8-22 mins                     D. Scott Chianti Goh PT, DPT 11/26/18, 11:31 AM

## 2018-11-26 NOTE — Clinical Social Work Placement (Signed)
CLINICAL SOCIAL WORK PLACEMENT  NOTE  Date:  11/26/2018  Patient Details  Name: Kwanna Freehill MRN: [id number] Date of Birth: 1940/11/02  Clinical Social Work is seeking post-discharge placement for this patient at the Skilled  Nursing Facility level of care (*CSW will initial, date and re-position this form in  chart as items are completed):  Yes   Patient/family provided with [place] Clinical Social Work Department's list of facilities offering this level of care within the geographic area requested by the patient (or if unable, by the patient's family).  Yes   Patient/family informed of their freedom to choose among providers that offer the needed level of care, that participate in Medicare, Medicaid or managed care program needed by the patient, have an available bed and are willing to accept the patient.  Yes   Patient/family informed of 's ownership interest in [facility] and [facility], as well as of the fact that they are under no obligation to receive care at these facilities.  PASRR submitted to EDS on 11/26/18     PASRR number received on 11/26/18     Existing PASRR number confirmed on       FL2 transmitted to all facilities in geographic area requested by pt/family on 11/26/18     FL2 transmitted to all facilities within larger geographic area on       Patient informed that his/her managed care company has contracts with or will negotiate with certain facilities, including the following:        Yes   Patient/family informed of bed offers received.  Patient chooses bed at [facility] )     Physician recommends and patient chooses bed at      Patient to be transferred to   on  .  Patient to be transferred to facility by       Patient family notified on   of transfer.  Name of family member notified:        PHYSICIAN       Additional Comment:    _______________________________________________ Emaly Boschert, Darleen Crocker,  LCSW 11/26/2018, 2:52 PM

## 2018-11-26 NOTE — Progress Notes (Signed)
Physical Therapy Treatment Patient Details Name: Amber Bridges MRN: [id number] DOB: 12-02-1940 Today's Date: 11/26/2018    History of Present Illness Miel "Peggy" Mcroberts is a 78 yo female who comes to [facility] for elective Left TKA. PMH: Right TKA (10ya), Rt sciatica . PTA pt lived alone performed all IADL, ADL, driving, housework. SPC for AMB outside of house.     PT Comments    Pt presents with deficits in strength, transfers, mobility, gait, balance, L knee ROM, and activity tolerance.  Pt required min A with LEs during bed mobility tasks and CGA with transfers from an elevated surface with cues for sequencing.  Pt was able to amb 30 feet this session with a RW using step-to pattern with a slow, effortful, cautious cadence but with no LLE knee buckling noted. Pt will benefit from PT services in a SNF setting upon discharge to safely address above deficits for decreased caregiver assistance and eventual return to PLOF.     Follow Up Recommendations  SNF;Supervision for mobility/OOB     Equipment Recommendations  None recommended by PT    Recommendations for Other Services       Precautions / Restrictions Precautions Precautions: Fall;Knee Restrictions Weight Bearing Restrictions: Yes LLE Weight Bearing: Weight bearing as tolerated    Mobility  Bed Mobility Overal bed mobility: Needs Assistance Bed Mobility: Sit to Supine     Supine to sit: Min assist Sit to supine: Min assist   General bed mobility comments: Min A for BLEs in/out of bed  Transfers Overall transfer level: Needs assistance Equipment used: Rolling walker (2 wheeled) Transfers: Sit to/from Stand Sit to Stand: From elevated surface;Min guard         General transfer comment: Min verbal and visual cues for proper sequencing with transfers  Ambulation/Gait Ambulation/Gait assistance: Min guard Gait Distance (Feet): 30 Feet Assistive device: Rolling walker (2 wheeled) Gait Pattern/deviations:  Step-to pattern;Decreased stance time - left;Decreased step length - right;Antalgic;Trunk flexed Gait velocity: decreased   General Gait Details: Step-to pattern with slow, cautious cadence with no L knee buckling this session.     Stairs             Wheelchair Mobility    Modified Rankin (Stroke Patients Only)       Balance Overall balance assessment: Needs assistance;History of Falls Sitting-balance support: No upper extremity supported;Feet supported Sitting balance-Leahy Scale: Good     Standing balance support: Bilateral upper extremity supported Standing balance-Leahy Scale: Fair                              Cognition Arousal/Alertness: Awake/alert Behavior During Therapy: WFL for tasks assessed/performed Overall Cognitive Status: Within Functional Limits for tasks assessed                                        Exercises Total Joint Exercises Ankle Circles/Pumps: AROM;Both;15 reps;10 reps Quad Sets: AROM;Strengthening;Left;10 reps;15 reps Gluteal Sets: Strengthening;Both;10 reps;15 reps Long Arc Quad: AROM;Strengthening;Left;10 reps;15 reps Knee Flexion: AROM;Strengthening;Left;10 reps;15 reps Goniometric ROM: L knee AROM: 2-65 deg limited by pain Marching in Standing: AROM;Both;10 reps;Standing Other Exercises: low amplitude mini squats x 5 Other Exercises: HEP education review per handout    General Comments        Pertinent Vitals/Pain Pain Assessment: 0-10 Pain Score: 1  Pain Location: L knee Pain Descriptors /  Indicators: Aching Pain Intervention(s): Monitored during session    Home Living                      Prior Function            PT Goals (current goals can now be found in the care plan section) Progress towards PT goals: Progressing toward goals    Frequency    BID      PT Plan Current plan remains appropriate    Co-evaluation              AM-PAC PT "6 Clicks" Mobility    Outcome Measure  Help needed turning from your back to your side while in a flat bed without using bedrails?: A Little Help needed moving from lying on your back to sitting on the side of a flat bed without using bedrails?: A Little Help needed moving to and from a bed to a chair (including a wheelchair)?: A Little Help needed standing up from a chair using your arms (e.g., wheelchair or bedside chair)?: A Little Help needed to walk in hospital room?: A Little Help needed climbing 3-5 steps with a railing? : A Lot 6 Click Score: 17    End of Session Equipment Utilized During Treatment: Gait belt Activity Tolerance: Patient tolerated treatment well Patient left: in chair;with chair alarm set;with call bell/phone within reach;with SCD's reapplied;Other (comment)(Polar care donned to L knee) Nurse Communication: Mobility status PT Visit Diagnosis: Unsteadiness on feet (R26.81);Other abnormalities of gait and mobility (R26.89);Difficulty in walking, not elsewhere classified (R26.2)     Time: 8295-6213 PT Time Calculation (min) (ACUTE ONLY): 25 min  Charges:  $Gait Training: 8-22 mins $Therapeutic Exercise: 8-22 mins                     D. Scott Durwin Davisson PT, DPT 11/26/18, 3:16 PM

## 2018-11-26 NOTE — Progress Notes (Signed)
OT Cancellation Note  Patient Details Name: Amber Bridges MRN: [id number] DOB: 08/28/1941   Cancelled Treatment:    Reason Eval/Treat Not Completed: Patient declined, no reason specified. Upon arrival to pt room, pt seated in recliner after finishing PT session. Pt politely declined OT services at this time. Will re-attempt as available and pt medically appropriate.   Shara Blazing, M.S., OTR/L Ascom: [phone] 11/26/18, 2:12 PM

## 2018-11-27 NOTE — Discharge Planning (Signed)
Patient IV removed.  RN assessment and VS revealed stability for DC to [facility] rm 325. Report called s/w Delle Reining, LPN. Discharge papers printed and placed in facility packet with signed pain script and lovenox.  Pain meds given and under control for DC. Worked w/ PT prior to DC.  Once ready, patient will be wheeled to front and family transporting to facility. Waiting on arrival.

## 2018-11-27 NOTE — Clinical Social Work Placement (Signed)
CLINICAL SOCIAL WORK PLACEMENT  NOTE  Date:  11/27/2018  Patient Details  Name: Amber Bridges MRN: [id number] Date of Birth: April 18, 1941  Clinical Social Work is seeking post-discharge placement for this patient at the Skilled  Nursing Facility level of care (*CSW will initial, date and re-position this form in  chart as items are completed):  Yes   Patient/family provided with [place] Clinical Social Work Department's list of facilities offering this level of care within the geographic area requested by the patient (or if unable, by the patient's family).  Yes   Patient/family informed of their freedom to choose among providers that offer the needed level of care, that participate in Medicare, Medicaid or managed care program needed by the patient, have an available bed and are willing to accept the patient.  Yes   Patient/family informed of [facility]'s ownership interest in [facility] and [facility], as well as of the fact that they are under no obligation to receive care at these facilities.  PASRR submitted to EDS on 11/26/18     PASRR number received on 11/26/18     Existing PASRR number confirmed on       FL2 transmitted to all facilities in geographic area requested by pt/family on 11/26/18     FL2 transmitted to all facilities within larger geographic area on       Patient informed that his/her managed care company has contracts with or will negotiate with certain facilities, including the following:        Yes   Patient/family informed of bed offers received.  Patient chooses bed at [facility] )     Physician recommends and patient chooses bed at      Patient to be transferred to [facility] ) on 11/27/18.  Patient to be transferred to facility by (Patient's daughter Herbert Seta will transport. )     Patient family notified on 11/27/18 of transfer.  Name of family member notified:  (Patient's daughter Herbert Seta is aware of D/C today. )      PHYSICIAN       Additional Comment:    _______________________________________________ Cortez Flippen, Darleen Crocker, LCSW 11/27/2018, 9:56 AM

## 2018-11-27 NOTE — Care Management Important Message (Signed)
Important Message  Patient Details  Name: Amber Bridges MRN: [id number] Date of Birth: June 13, 1941   Medicare Important Message Given:  Yes  IM Given around 10:00 am. Late entry, no computer available on the unit and forgot to document when I returned to my office.   Juliann Pulse A Azavier Creson 11/27/2018, 11:20 AM

## 2018-11-27 NOTE — Plan of Care (Signed)

## 2018-11-27 NOTE — Progress Notes (Signed)
   Subjective: 3 Days Post-Op Procedure(s) (LRB): COMPUTER ASSISTED TOTAL KNEE ARTHROPLASTY-LEFT (Left) Patient reports pain as mild.   Patient is well, and has had no acute complaints or problems Patient ambulated 30 feet with physical therapy.  She is improving.  Plan is to go Rehab after hospital stay.  [place] today. no nausea and no vomiting Patient denies any chest pains or shortness of breath. Objective: Vital signs in last 24 hours: Temp:  [98.2 F (36.8 C)-98.4 F (36.9 C)] 98.2 F (36.8 C) (02/20 0029) Pulse Rate:  [74-79] 74 (02/20 0029) Resp:  [17-18] 17 (02/20 0029) BP: (146-151)/(51-55) 146/55 (02/20 0029) SpO2:  [94 %-98 %] 98 % (02/20 0029) well approximated incision Heels are non tender and elevated off the bed using rolled towels Intake/Output from previous day: 02/19 0701 - 02/20 0700 In: 720 [P.O.:720] Out: -  Intake/Output this shift: No intake/output data recorded.  No results for input(s): HGB in the last 72 hours. No results for input(s): WBC, RBC, HCT, PLT in the last 72 hours. No results for input(s): NA, K, CL, CO2, BUN, CREATININE, GLUCOSE, CALCIUM in the last 72 hours. No results for input(s): LABPT, INR in the last 72 hours.  EXAM General - Patient is Alert, Appropriate and Oriented Extremity - Neurologically intact Neurovascular intact Sensation intact distally Intact pulses distally Dorsiflexion/Plantar flexion intact No cellulitis present Compartment soft Dressing - dressing C/D/I Motor Function - intact, moving foot and toes well on exam.    Past Medical History:  Diagnosis Date  . Arthritis   . Bronchitis   . Coronary artery disease   . Depression   . HOH (hard of hearing)    AIDS  . Hypertension   . Sleep apnea    CPAP    Assessment/Plan: 3 Days Post-Op Procedure(s) (LRB): COMPUTER ASSISTED TOTAL KNEE ARTHROPLASTY-LEFT (Left) Active Problems:   Total knee replacement status  Estimated body mass index is 37.25  kg/m as calculated from the following:   Height as of this encounter: 5' 6.5" (1.689 m).   Weight as of this encounter: 106.3 kg. Up with therapy Discharge to SNF today  Labs: None DVT Prophylaxis - Lovenox, Foot Pumps and TED hose Weight-Bearing as tolerated to left leg Needs bowel movement before discharge  Please wash operative leg, change dressing apply TED stockings to both legs prior to discharge  Reche Dixon PA-C [place] 11/27/2018, 5:58 AM

## 2018-11-27 NOTE — Progress Notes (Signed)
Patient is medically stable for D/C to [facility] today. Per Seth Bake admissions coordinator at [facility] authorization is pending however she will accept patient today to room 325. RN will call report at [phone]. Patient's daughter Nira Conn will transport patient to [facility] today between 10 am and 11 am. Clinical Education officer, museum (CSW) sent D/C orders to [facility] via [place]. Patient will bring her own c-pap from home to [facility] today. C-pap is at bedside. Patient is aware of above. Patient's daughter Nira Conn is aware of above. Please reconsult if future social work needs arise. CSW signing off.   [employer], LCSW [phone]

## 2018-11-27 NOTE — Progress Notes (Signed)
Physical Therapy Treatment Patient Details Name: Amber Bridges MRN: [id number] DOB: 03-Aug-1941 Today's Date: 11/27/2018    History of Present Illness Amber Bridges is a 78 yo female who comes to [facility] for elective Left TKA. PMH: Right TKA (10ya), Rt sciatica . PTA pt lived alone performed all IADL, ADL, driving, housework. SPC for AMB outside of house.     PT Comments    Pt presents with deficits in strength, transfers, mobility, gait, balance, L knee ROM, and activity tolerance but showed some progression towards goals this session.  Pt did not require physical assistance with the LLE during sup to sit but did require increased time and effort to complete task.  Pt required the EOB to be elevated along with extra effort needed during sit to stand.  Pt was able to amb 60 feet this session with a RW and CGA with mostly step to pattern but did initiate step-through sequencing occasionally.  Pt was steady with amb but ambulated with slow, cautious cadence and decreased step length.  Pt will benefit from PT services in a SNF setting upon discharge to safely address above deficits for decreased caregiver assistance and eventual return to PLOF.     Follow Up Recommendations  SNF;Supervision for mobility/OOB     Equipment Recommendations  None recommended by PT    Recommendations for Other Services       Precautions / Restrictions Precautions Precautions: Fall;Knee Restrictions Weight Bearing Restrictions: Yes LLE Weight Bearing: Weight bearing as tolerated    Mobility  Bed Mobility Overal bed mobility: Needs Assistance       Supine to sit: Supervision     General bed mobility comments: Extra time and effort but no assist required during sup to sit  Transfers Overall transfer level: Needs assistance Equipment used: Rolling walker (2 wheeled) Transfers: Sit to/from Stand Sit to Stand: From elevated surface;Min guard         General transfer comment: Extra time  and effort needed to stand but no physical assistance required  Ambulation/Gait Ambulation/Gait assistance: Min guard Gait Distance (Feet): 60 Feet Assistive device: Rolling walker (2 wheeled) Gait Pattern/deviations: Step-through pattern;Step-to pattern;Antalgic;Decreased step length - right;Decreased stance time - left Gait velocity: decreased   General Gait Details: Slow cadence with primarily step-to pattern but demonstrated several step-through steps during amb this session; pt practice provided on 90 deg L turns to prevent CKC twisting on the left knee   Stairs             Wheelchair Mobility    Modified Rankin (Stroke Patients Only)       Balance Overall balance assessment: Needs assistance;History of Falls Sitting-balance support: No upper extremity supported;Feet supported Sitting balance-Leahy Scale: Good     Standing balance support: Bilateral upper extremity supported Standing balance-Leahy Scale: Fair                              Cognition Arousal/Alertness: Awake/alert Behavior During Therapy: WFL for tasks assessed/performed Overall Cognitive Status: Within Functional Limits for tasks assessed                                        Exercises Total Joint Exercises Ankle Circles/Pumps: AROM;Both;10 reps;15 reps Quad Sets: AROM;Strengthening;Left;10 reps;15 reps Gluteal Sets: Strengthening Hip ABduction/ADduction: Left;10 reps;AROM;AAROM Straight Leg Raises: AROM;Left;10 reps;AAROM Long Arc Quad: AROM;Strengthening;Left;10 reps;15  reps Knee Flexion: AROM;Strengthening;Left;10 reps;15 reps Goniometric ROM: L knee AROM: 2-79 deg  Marching in Standing: AROM;Both;10 reps;Standing    General Comments        Pertinent Vitals/Pain Pain Assessment: 0-10 Pain Score: 3  Pain Location: 0/10 at rest, 3/10 with ambulation Pain Descriptors / Indicators: Aching;Sore Pain Intervention(s): Premedicated before session;Monitored  during session    Home Living                      Prior Function            PT Goals (current goals can now be found in the care plan section) Progress towards PT goals: Progressing toward goals    Frequency    BID      PT Plan Current plan remains appropriate    Co-evaluation              AM-PAC PT "6 Clicks" Mobility   Outcome Measure  Help needed turning from your back to your side while in a flat bed without using bedrails?: A Little Help needed moving from lying on your back to sitting on the side of a flat bed without using bedrails?: A Little Help needed moving to and from a bed to a chair (including a wheelchair)?: A Little Help needed standing up from a chair using your arms (e.g., wheelchair or bedside chair)?: A Little Help needed to walk in hospital room?: A Little Help needed climbing 3-5 steps with a railing? : A Lot 6 Click Score: 17    End of Session Equipment Utilized During Treatment: Gait belt Activity Tolerance: Patient tolerated treatment well Patient left: in chair;with chair alarm set;with call bell/phone within reach;with SCD's reapplied;Other (comment)(Polar care donned to L knee) Nurse Communication: Mobility status PT Visit Diagnosis: Unsteadiness on feet (R26.81);Other abnormalities of gait and mobility (R26.89);Difficulty in walking, not elsewhere classified (R26.2)     Time: 4098-1191 PT Time Calculation (min) (ACUTE ONLY): 25 min  Charges:  $Gait Training: 8-22 mins $Therapeutic Exercise: 8-22 mins                     D. Scott Shemica Meath PT, DPT 11/27/18, 11:06 AM

## 2018-12-01 DIAGNOSIS — M5136 Other intervertebral disc degeneration, lumbar region: Secondary | ICD-10-CM | POA: Diagnosis not present

## 2018-12-01 DIAGNOSIS — F39 Unspecified mood [affective] disorder: Secondary | ICD-10-CM | POA: Diagnosis not present

## 2018-12-01 DIAGNOSIS — M1712 Unilateral primary osteoarthritis, left knee: Secondary | ICD-10-CM

## 2018-12-01 DIAGNOSIS — I10 Essential (primary) hypertension: Secondary | ICD-10-CM | POA: Diagnosis not present

## 2018-12-17 DIAGNOSIS — N183 Chronic kidney disease, stage 3 unspecified: Secondary | ICD-10-CM | POA: Insufficient documentation

## 2019-03-12 ENCOUNTER — Other Ambulatory Visit (INDEPENDENT_AMBULATORY_CARE_PROVIDER_SITE_OTHER): Payer: Self-pay | Admitting: Vascular Surgery

## 2019-03-12 ENCOUNTER — Ambulatory Visit (INDEPENDENT_AMBULATORY_CARE_PROVIDER_SITE_OTHER): Payer: Medicare Other

## 2019-03-12 ENCOUNTER — Other Ambulatory Visit: Payer: Self-pay

## 2019-03-12 ENCOUNTER — Encounter (INDEPENDENT_AMBULATORY_CARE_PROVIDER_SITE_OTHER): Payer: Self-pay | Admitting: Vascular Surgery

## 2019-03-12 ENCOUNTER — Ambulatory Visit (INDEPENDENT_AMBULATORY_CARE_PROVIDER_SITE_OTHER): Payer: Medicare Other | Admitting: Vascular Surgery

## 2019-03-12 VITALS — BP 161/70 | HR 69 | Resp 16 | Wt 236.0 lb

## 2019-03-12 DIAGNOSIS — I6523 Occlusion and stenosis of bilateral carotid arteries: Secondary | ICD-10-CM | POA: Diagnosis not present

## 2019-03-12 DIAGNOSIS — Z7902 Long term (current) use of antithrombotics/antiplatelets: Secondary | ICD-10-CM

## 2019-03-12 DIAGNOSIS — M1712 Unilateral primary osteoarthritis, left knee: Secondary | ICD-10-CM

## 2019-03-12 DIAGNOSIS — Z7982 Long term (current) use of aspirin: Secondary | ICD-10-CM

## 2019-03-12 DIAGNOSIS — E785 Hyperlipidemia, unspecified: Secondary | ICD-10-CM

## 2019-03-12 DIAGNOSIS — Z87891 Personal history of nicotine dependence: Secondary | ICD-10-CM

## 2019-03-12 DIAGNOSIS — I10 Essential (primary) hypertension: Secondary | ICD-10-CM | POA: Diagnosis not present

## 2019-03-12 DIAGNOSIS — R7303 Prediabetes: Secondary | ICD-10-CM | POA: Diagnosis not present

## 2019-03-12 DIAGNOSIS — Z79899 Other long term (current) drug therapy: Secondary | ICD-10-CM

## 2019-03-13 ENCOUNTER — Encounter (INDEPENDENT_AMBULATORY_CARE_PROVIDER_SITE_OTHER): Payer: Self-pay | Admitting: Vascular Surgery

## 2019-03-13 NOTE — Progress Notes (Signed)
MRN : [id number]  Amber Bridges is a 78 y.o. (Sep 10, 1941) female who presents with chief complaint of  Chief Complaint  Patient presents with  . Follow-up    ultrasound follow up  .  History of Present Illness:   The patient is seen for follow up evaluation of carotid stenosis. The carotid stenosis followed by ultrasound.   The patient denies amaurosis fugax. There is no recent history of TIA symptoms or focal motor deficits. There is no prior documented CVA.  The patient is taking enteric-coated aspirin 81 mg daily.  There is no history of migraine headaches. There is no history of seizures.  The patient has a history of coronary artery disease, no recent episodes of angina or shortness of breath. The patient denies PAD or claudication symptoms. There is a history of hyperlipidemia which is being treated with a statin.    Carotid Duplex done today shows RICA 40-59% and LICA <30%.  No change compared to last study in 03/07/2017.  Current Meds  Medication Sig  . acetaminophen (TYLENOL) 500 MG tablet Take 500 mg by mouth every 6 (six) hours as needed.  Marland Kitchen amLODipine (NORVASC) 5 MG tablet Take 1 tablet by mouth daily.  Marland Kitchen amoxicillin (AMOXIL) 500 MG capsule Take 2,000 mg by mouth daily as needed (dental procedure).  Marland Kitchen atorvastatin (LIPITOR) 40 MG tablet Take 1 tablet by mouth at bedtime.  . Doxylamine Succinate, Sleep, (SLEEP AID PO) Take 1 tablet by mouth at bedtime as needed (sleep).  Marland Kitchen FLUoxetine (PROZAC) 40 MG capsule Take 1 capsule by mouth daily.  . fluticasone (FLONASE) 50 MCG/ACT nasal spray Place 1-2 sprays into both nostrils daily as needed for allergies or rhinitis.  . hydrochlorothiazide (HYDRODIURIL) 25 MG tablet Take 1 tablet by mouth every evening.   Marland Kitchen losartan (COZAAR) 50 MG tablet Take 1 tablet by mouth every evening.   . Multiple Vitamins-Minerals (CENTRUM SILVER PO) Take 1 tablet by mouth daily.  . Polyethylene Glycol 3350 (MIRALAX PO) Take 17 g by mouth  daily as needed (constipation).  Marland Kitchen senna (SENOKOT) 8.6 MG tablet Take 1 tablet by mouth daily.  . traZODone (DESYREL) 50 MG tablet Take 1 tablet by mouth at bedtime as needed for sleep.     Past Medical History:  Diagnosis Date  . Arthritis   . Bronchitis   . Coronary artery disease   . Depression   . HOH (hard of hearing)    AIDS  . Hypertension   . Sleep apnea    CPAP    Past Surgical History:  Procedure Laterality Date  . ABDOMINAL HYSTERECTOMY    . APPENDECTOMY    . BACK SURGERY     X 3  . CATARACT EXTRACTION W/ INTRAOCULAR LENS IMPLANT Right 2017  . CATARACT EXTRACTION W/PHACO Left 08/08/2016   Procedure: CATARACT EXTRACTION PHACO AND INTRAOCULAR LENS PLACEMENT (IOC);  Surgeon: Sallee Lange, MD;  Location: [facility] ORS;  Service: Ophthalmology;  Laterality: Left;  fluid lot # [id number]  exp02/28/2019 [place]    01:15.3 AP%   25.2 CDE   35.06   . CTR Right   . FOOT SURGERY    . JOINT REPLACEMENT     TKR  . KNEE ARTHROPLASTY Left 11/24/2018   Procedure: COMPUTER ASSISTED TOTAL KNEE ARTHROPLASTY-LEFT;  Surgeon: Donato Heinz, MD;  Location: [facility] ORS;  Service: Orthopedics;  Laterality: Left;  . NOSE SURGERY    . RCR Left   . SHOULDER ARTHROSCOPY Right    for bursitis  .  SPINE SURGERY  1973, 1978, 2016   Most recent Laminectomy to T11 and L3    Social History Social History   Tobacco Use  . Smoking status: Former Smoker    Last attempt to quit: 10/08/1960    Years since quitting: 58.4  . Smokeless tobacco: Never Used  Substance Use Topics  . Alcohol use: Yes    Comment: 2 cocktails  . Drug use: Never    Family History Family History  Problem Relation Age of Onset  . Congestive Heart Failure Mother   . Diabetes Father   . Congestive Heart Failure Father   . Congestive Heart Failure Sister   . Congestive Heart Failure Brother     Allergies  Allergen Reactions  . 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 Can take flu vaccine that does not relate to eggs Site of flu vaccine. Can take flu vaccine that does not relate to eggs   . Penicillins Diarrhea    Has patient had a PCN reaction causing immediate rash, facial/tongue/throat swelling, SOB or lightheadedness with hypotension: No Has patient had a PCN reaction causing severe rash involving mucus membranes or skin necrosis: No Has patient had a PCN reaction that required hospitalization No Has patient had a PCN reaction occurring within the last 10 years: No If all of the above answers are "NO", then may proceed with Cephalosporin use.   . Egg Latanya Presser, Egg] Nausea Only  . Lisinopril Cough     REVIEW OF SYSTEMS (Negative unless checked)  Constitutional: [] Weight loss  [] Fever  [] Chills Cardiac: [] Chest pain   [] Chest pressure   [] Palpitations   [] Shortness of breath when laying flat   [] Shortness of breath with exertion. Vascular:  [] Pain in legs with walking   [] Pain in legs at rest  [] History of DVT   [] Phlebitis   [] Swelling in legs   [] Varicose veins   [] Non-healing ulcers Pulmonary:   [] Uses home oxygen   [] Productive cough   [] Hemoptysis   [] Wheeze  [] COPD   [] Asthma Neurologic:  [] Dizziness   [] Seizures   [] History of stroke   [] History of TIA  [] Aphasia   [] Vissual changes   [] Weakness or numbness in arm   [] Weakness or numbness in leg Musculoskeletal:   [] Joint swelling   [x] Joint pain   [x] Low back pain Hematologic:  [] Easy bruising  [] Easy bleeding   [] Hypercoagulable state   [] Anemic Gastrointestinal:  [] Diarrhea   [] Vomiting  [] Gastroesophageal reflux/heartburn   [] Difficulty swallowing. Genitourinary:  [] Chronic kidney disease   [] Difficult urination  [] Frequent urination   [] Blood in urine Skin:  [] Rashes   [] Ulcers  Psychological:  [] History of anxiety   []  History of major depression.  Physical Examination  Vitals:   03/12/19 1420  BP: (!) 161/70  Pulse: 69  Resp: 16  Weight: 236 lb (107  kg)   Body mass index is 37.52 kg/m. Gen: WD/WN, NAD Head: [place]/AT, No temporalis wasting.  Ear/Nose/Throat: Hearing grossly intact, nares w/o erythema or drainage Eyes: PER, EOMI, sclera nonicteric.  Neck: Supple, no large masses.   Pulmonary:  Good air movement, no audible wheezing bilaterally, no use of accessory muscles.  Cardiac: RRR, no JVD Vascular: right carotid bruit Vessel Right Left  Radial Palpable Palpable  Brachial Palpable Palpable  Carotid Palpable Palpable  Gastrointestinal: Non-distended. No guarding/no peritoneal signs.  Musculoskeletal: M/S 5/5 throughout.  No deformity or atrophy.  Neurologic: CN 2-12  intact. Symmetrical.  Speech is fluent. Motor exam as listed above. Psychiatric: Judgment intact, Mood & affect appropriate for pt's clinical situation. Dermatologic: No rashes or ulcers noted.  No changes consistent with cellulitis. Lymph : No lichenification or skin changes of chronic lymphedema.  CBC Lab Results  Component Value Date   WBC 5.9 11/12/2018   HGB 13.4 11/12/2018   HCT 41.6 11/12/2018   MCV 95.0 11/12/2018   PLT 251 11/12/2018    BMET    Component Value Date/Time   NA 137 11/12/2018 1148   K 4.0 11/12/2018 1148   CL 104 11/12/2018 1148   CO2 23 11/12/2018 1148   GLUCOSE 105 (H) 11/12/2018 1148   BUN 22 11/12/2018 1148   CREATININE 1.27 (H) 11/12/2018 1148   CALCIUM 9.3 11/12/2018 1148   GFRNONAA 41 (L) 11/12/2018 1148   GFRAA 47 (L) 11/12/2018 1148   CrCl cannot be calculated (Patient's most recent lab result is older than the maximum 21 days allowed.).  COAG Lab Results  Component Value Date   INR 0.96 11/12/2018    Radiology Vas US Carotid  Result Date: 03/12/2019 Carotid Arterial Duplex Study Indications:       CVD. Comparison Study:  03/10/2018 Performing Technologist: Salvadore Farber RVT  Examination Guidelines: A complete evaluation includes B-mode imaging, spectral Doppler, color Doppler, and power Doppler as needed of all  accessible portions of each vessel. Bilateral testing is considered an integral part of a complete examination. Limited examinations for reoccurring indications may be performed as noted.  Right Carotid Findings: +----------+--------+--------+--------+----------------------+--------+           PSV cm/sEDV cm/sStenosisDescribe              Comments +----------+--------+--------+--------+----------------------+--------+ CCA Prox  54      6                                              +----------+--------+--------+--------+----------------------+--------+ CCA Mid   71      9                                              +----------+--------+--------+--------+----------------------+--------+ CCA Distal47      10                                             +----------+--------+--------+--------+----------------------+--------+ ICA Prox  135     27      40-59%  calcific and irregular         +----------+--------+--------+--------+----------------------+--------+ ICA Mid   66      17                                             +----------+--------+--------+--------+----------------------+--------+ ICA Distal63      14                                             +----------+--------+--------+--------+----------------------+--------+ ECA  109     11                                             +----------+--------+--------+--------+----------------------+--------+ +----------+--------+-------+----------------+-------------------+           PSV cm/sEDV cmsDescribe        Arm Pressure (mmHG) +----------+--------+-------+----------------+-------------------+ [id number]            Multiphasic, WNL                    +----------+--------+-------+----------------+-------------------+ +---------+--------+--+--------+---------+ VertebralPSV cm/s29EDV cm/sAntegrade +---------+--------+--+--------+---------+  Left Carotid Findings:  +----------+--------+--------+--------+--------+--------+           PSV cm/sEDV cm/sStenosisDescribeComments +----------+--------+--------+--------+--------+--------+ CCA Prox  60      10                               +----------+--------+--------+--------+--------+--------+ CCA Mid   64      12                               +----------+--------+--------+--------+--------+--------+ CCA Distal54      11                               +----------+--------+--------+--------+--------+--------+ ICA Prox  52      13                               +----------+--------+--------+--------+--------+--------+ ICA Mid   64      19      1-39%                    +----------+--------+--------+--------+--------+--------+ ICA Distal57      14                               +----------+--------+--------+--------+--------+--------+ ECA       76      5                                +----------+--------+--------+--------+--------+--------+ +----------+--------+--------+----------------+-------------------+ SubclavianPSV cm/sEDV cm/sDescribe        Arm Pressure (mmHG) +----------+--------+--------+----------------+-------------------+           88              Multiphasic, WNL                    +----------+--------+--------+----------------+-------------------+ +---------+--------+--+--------+---------+ VertebralPSV cm/s26EDV cm/sAntegrade +---------+--------+--+--------+---------+  Summary: Right Carotid: Velocities in the right ICA are consistent with a 40-59%                stenosis. Mild plaque in the bulb and Stenotic lesion at the                origin of the proximal ICA with improved flow compared to the                previous study. Left Carotid: Velocities in the left ICA are consistent with a 1-39% stenosis.               Minimal calcified plaque in  the bulb/proximal ICA. Vertebrals:  Bilateral vertebral arteries demonstrate antegrade flow. Subclavians:  Normal flow hemodynamics were seen in bilateral subclavian              arteries. *See table(s) above for measurements and observations.  Electronically signed by Levora Dredge MD on 03/12/2019 at 4:46:46 PM.    Final      Assessment/Plan 1. Bilateral carotid artery stenosis Recommend:  Given the patient's asymptomatic subcritical stenosis no further invasive testing or surgery at this time.  Carotid Duplex done today shows RICA 40-59% and LICA <30%.   Continue antiplatelet therapy as prescribed Continue management of CAD, HTN and Hyperlipidemia Healthy heart diet,  encouraged exercise at least 4 times per week Follow up in 12 months with duplex ultrasound and physical exam   - VAS US CAROTID; Future  2. Essential hypertension Continue antihypertensive medications as already ordered, these medications have been reviewed and there are no changes at this time.   3. Hyperlipidemia, unspecified hyperlipidemia type Continue statin as ordered and reviewed, no changes at this time   4. Borderline diabetes mellitus Continue hypoglycemic medications as already ordered, these medications have been reviewed and there are no changes at this time.  Hgb A1C to be monitored as already arranged by primary service   5. Primary osteoarthritis of left knee Continue NSAID medications as already ordered, these medications have been reviewed and there are no changes at this time.  Continued activity and therapy was stressed.     Levora Dredge, MD  03/13/2019 2:20 PM

## 2019-10-29 ENCOUNTER — Ambulatory Visit (INDEPENDENT_AMBULATORY_CARE_PROVIDER_SITE_OTHER): Payer: Medicare Other | Admitting: Internal Medicine

## 2019-10-29 ENCOUNTER — Telehealth: Payer: Self-pay | Admitting: Internal Medicine

## 2019-10-29 ENCOUNTER — Other Ambulatory Visit: Payer: Self-pay

## 2019-10-29 DIAGNOSIS — G4730 Sleep apnea, unspecified: Secondary | ICD-10-CM

## 2019-10-29 DIAGNOSIS — G834 Cauda equina syndrome: Secondary | ICD-10-CM

## 2019-10-29 DIAGNOSIS — F32A Depression, unspecified: Secondary | ICD-10-CM

## 2019-10-29 DIAGNOSIS — I10 Essential (primary) hypertension: Secondary | ICD-10-CM | POA: Diagnosis not present

## 2019-10-29 DIAGNOSIS — R7303 Prediabetes: Secondary | ICD-10-CM | POA: Diagnosis not present

## 2019-10-29 DIAGNOSIS — E785 Hyperlipidemia, unspecified: Secondary | ICD-10-CM | POA: Diagnosis not present

## 2019-10-29 DIAGNOSIS — I6523 Occlusion and stenosis of bilateral carotid arteries: Secondary | ICD-10-CM | POA: Diagnosis not present

## 2019-10-29 DIAGNOSIS — F320 Major depressive disorder, single episode, mild: Secondary | ICD-10-CM
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
## 2019-10-29 NOTE — Telephone Encounter (Signed)
LMTCB and schedule a CPE in 62m and an fasting lab before appt

## 2019-10-31 ENCOUNTER — Encounter: Payer: Self-pay | Admitting: Internal Medicine

## 2019-10-31 DIAGNOSIS — F320 Major depressive disorder, single episode, mild: Secondary | ICD-10-CM | POA: Insufficient documentation

## 2019-10-31 DIAGNOSIS — F32A Depression, unspecified: Secondary | ICD-10-CM | POA: Insufficient documentation

## 2019-10-31 NOTE — Assessment & Plan Note (Signed)
Low carb diet and exercise.  Follow met b and a1c.   

## 2019-10-31 NOTE — Assessment & Plan Note (Signed)
Saw [facility] 03/2019.  Stable.  Recommended f/u in one year.

## 2019-10-31 NOTE — Assessment & Plan Note (Signed)
Reported in history.  Has seen neurosurgery previously.  Stable. Follow.

## 2019-10-31 NOTE — Assessment & Plan Note (Signed)
Continue cpap.  

## 2019-10-31 NOTE — Assessment & Plan Note (Signed)
Low cholesterol diet and exercise.  On lipitor.  Follow lipid panel and liver function tests.   

## 2019-10-31 NOTE — Assessment & Plan Note (Signed)
Reports blood pressure has been under control.  Continue same medication regimen.  Follow pressures.  Follow metabolic panel.

## 2019-10-31 NOTE — Assessment & Plan Note (Signed)
On prozac.  Stable.  Follow.  

## 2019-11-03 DIAGNOSIS — G4733 Obstructive sleep apnea (adult) (pediatric): Secondary | ICD-10-CM | POA: Diagnosis not present

## 2019-11-07 DIAGNOSIS — G4733 Obstructive sleep apnea (adult) (pediatric): Secondary | ICD-10-CM | POA: Diagnosis not present

## 2019-11-13 DIAGNOSIS — G4733 Obstructive sleep apnea (adult) (pediatric): Secondary | ICD-10-CM | POA: Diagnosis not present

## 2019-11-19 DIAGNOSIS — M170 Bilateral primary osteoarthritis of knee: Secondary | ICD-10-CM | POA: Diagnosis not present

## 2019-11-19 DIAGNOSIS — Z96653 Presence of artificial knee joint, bilateral: Secondary | ICD-10-CM | POA: Diagnosis not present

## 2019-12-06 DIAGNOSIS — G4733 Obstructive sleep apnea (adult) (pediatric): Secondary | ICD-10-CM | POA: Diagnosis not present

## 2019-12-10 ENCOUNTER — Telehealth: Payer: Self-pay | Admitting: Internal Medicine

## 2019-12-10 NOTE — Telephone Encounter (Signed)
Pt stated [facility] called and completed AWV in 08/2019.  She request call again in late 08/2020

## 2020-01-05 DIAGNOSIS — G4733 Obstructive sleep apnea (adult) (pediatric): Secondary | ICD-10-CM | POA: Diagnosis not present

## 2020-02-05 DIAGNOSIS — G4733 Obstructive sleep apnea (adult) (pediatric): Secondary | ICD-10-CM | POA: Diagnosis not present

## 2020-02-15 DIAGNOSIS — G4733 Obstructive sleep apnea (adult) (pediatric): Secondary | ICD-10-CM | POA: Diagnosis not present

## 2020-02-29 ENCOUNTER — Other Ambulatory Visit: Payer: Self-pay

## 2020-02-29 ENCOUNTER — Other Ambulatory Visit (INDEPENDENT_AMBULATORY_CARE_PROVIDER_SITE_OTHER): Payer: Medicare Other

## 2020-02-29 DIAGNOSIS — E785 Hyperlipidemia, unspecified: Secondary | ICD-10-CM

## 2020-02-29 DIAGNOSIS — I10 Essential (primary) hypertension: Secondary | ICD-10-CM | POA: Diagnosis not present

## 2020-02-29 DIAGNOSIS — R7303 Prediabetes: Secondary | ICD-10-CM

## 2020-02-29 LAB — CBC WITH DIFFERENTIAL/PLATELET
Basophils Absolute: 0 10*3/uL (ref 0.0–0.1)
Basophils Relative: 1 % (ref 0.0–3.0)
Eosinophils Absolute: 0.3 10*3/uL (ref 0.0–0.7)
Eosinophils Relative: 5.5 % — ABNORMAL HIGH (ref 0.0–5.0)
HCT: 38.8 % (ref 36.0–46.0)
Hemoglobin: 13 g/dL (ref 12.0–15.0)
Lymphocytes Relative: 21.9 % (ref 12.0–46.0)
Lymphs Abs: 1 10*3/uL (ref 0.7–4.0)
MCHC: 33.5 g/dL (ref 30.0–36.0)
MCV: 92.8 fl (ref 78.0–100.0)
Monocytes Absolute: 0.4 10*3/uL (ref 0.1–1.0)
Monocytes Relative: 8.7 % (ref 3.0–12.0)
Neutro Abs: 2.9 10*3/uL (ref 1.4–7.7)
Neutrophils Relative %: 62.9 % (ref 43.0–77.0)
Platelets: 194 10*3/uL (ref 150.0–400.0)
RBC: 4.18 Mil/uL (ref 3.87–5.11)
RDW: 13.9 % (ref 11.5–15.5)
WBC: 4.6 10*3/uL (ref 4.0–10.5)

## 2020-02-29 LAB — HEPATIC FUNCTION PANEL
ALT: 16 U/L (ref 0–35)
AST: 17 U/L (ref 0–37)
Albumin: 4.5 g/dL (ref 3.5–5.2)
Alkaline Phosphatase: 66 U/L (ref 39–117)
Bilirubin, Direct: 0.2 mg/dL (ref 0.0–0.3)
Total Bilirubin: 0.7 mg/dL (ref 0.2–1.2)
Total Protein: 7.3 g/dL (ref 6.0–8.3)

## 2020-02-29 LAB — BASIC METABOLIC PANEL
BUN: 25 mg/dL — ABNORMAL HIGH (ref 6–23)
CO2: 27 mEq/L (ref 19–32)
Calcium: 9.6 mg/dL (ref 8.4–10.5)
Chloride: 103 mEq/L (ref 96–112)
Creatinine, Ser: 1.15 mg/dL (ref 0.40–1.20)
GFR: 45.51 mL/min — ABNORMAL LOW (ref >60.00)
Glucose, Bld: 95 mg/dL (ref 70–99)
Potassium: 4.2 mEq/L (ref 3.5–5.1)
Sodium: 138 mEq/L (ref 135–145)

## 2020-02-29 LAB — LIPID PANEL
Cholesterol: 150 mg/dL (ref 0–200)
HDL: 62 mg/dL (ref >39.00)
LDL Cholesterol: 74 mg/dL (ref 0–99)
NonHDL: 87.78
Total CHOL/HDL Ratio: 2
Triglycerides: 67 mg/dL (ref 0.0–149.0)
VLDL: 13.4 mg/dL (ref 0.0–40.0)

## 2020-02-29 LAB — HEMOGLOBIN A1C: Hgb A1c MFr Bld: 5.7 % (ref 4.6–6.5)

## 2020-02-29 LAB — TSH: TSH: 2.9 u[IU]/mL (ref 0.35–4.50)

## 2020-03-03 ENCOUNTER — Encounter: Payer: Self-pay | Admitting: Internal Medicine

## 2020-03-03 ENCOUNTER — Ambulatory Visit (INDEPENDENT_AMBULATORY_CARE_PROVIDER_SITE_OTHER): Payer: Medicare Other | Admitting: Internal Medicine

## 2020-03-03 ENCOUNTER — Other Ambulatory Visit: Payer: Self-pay

## 2020-03-03 VITALS — BP 138/64 | HR 72 | Temp 97.4°F | Resp 16 | Ht 65.0 in | Wt 241.0 lb

## 2020-03-03 DIAGNOSIS — Z0000 Encounter for general adult medical examination without abnormal findings: Secondary | ICD-10-CM | POA: Diagnosis not present

## 2020-03-03 DIAGNOSIS — I10 Essential (primary) hypertension: Secondary | ICD-10-CM

## 2020-03-03 DIAGNOSIS — R7303 Prediabetes: Secondary | ICD-10-CM

## 2020-03-03 DIAGNOSIS — F320 Major depressive disorder, single episode, mild: Secondary | ICD-10-CM | POA: Diagnosis not present

## 2020-03-03 DIAGNOSIS — I6523 Occlusion and stenosis of bilateral carotid arteries: Secondary | ICD-10-CM

## 2020-03-03 DIAGNOSIS — F32A Depression, unspecified: Secondary | ICD-10-CM

## 2020-03-03 DIAGNOSIS — E785 Hyperlipidemia, unspecified: Secondary | ICD-10-CM

## 2020-03-03 DIAGNOSIS — G4730 Sleep apnea, unspecified: Secondary | ICD-10-CM | POA: Diagnosis not present
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
## 2020-03-06 DIAGNOSIS — G4733 Obstructive sleep apnea (adult) (pediatric): Secondary | ICD-10-CM | POA: Diagnosis not present

## 2020-03-13 ENCOUNTER — Encounter: Payer: Self-pay | Admitting: Internal Medicine

## 2020-03-13 NOTE — Assessment & Plan Note (Signed)
On lipitor.  Low cholesterol diet and exercise.  Follow lipid panel and liver function tests.   

## 2020-03-13 NOTE — Assessment & Plan Note (Signed)
Saw [facility] 03/2019.  Recommended f/u in one year.

## 2020-03-13 NOTE — Assessment & Plan Note (Signed)
Continue cpap.  

## 2020-03-13 NOTE — Assessment & Plan Note (Signed)
On prozac.  Stable.  Follow.  

## 2020-03-13 NOTE — Assessment & Plan Note (Signed)
Low carb diet and exercise.  Follow met b and a1c.  

## 2020-03-13 NOTE — Assessment & Plan Note (Signed)
Blood pressure as outlined.  Continue on amlodipine, losartan and hctz.  Follow pressures.  Follow metabolic panel.

## 2020-03-14 DIAGNOSIS — G4733 Obstructive sleep apnea (adult) (pediatric): Secondary | ICD-10-CM | POA: Diagnosis not present

## 2020-03-16 ENCOUNTER — Telehealth: Payer: Self-pay | Admitting: Internal Medicine

## 2020-03-16 MED ORDER — NYSTATIN 100000 UNIT/GM EX CREA: 1.0000 "application " | TOPICAL_CREAM | Freq: Two times a day (BID) | CUTANEOUS | 0 refills | Status: DC

## 2020-03-16 NOTE — Telephone Encounter (Signed)
Patient stated she had discussed having nystatin cream sent in to use prn under her breasts and abdominal folds. Also, wanted to confirm with you before sending over order for cologuard.

## 2020-03-16 NOTE — Telephone Encounter (Signed)
Nystatin cream sent to [facility].  Regarding cologuard, please confirm if she has had a colonoscopy previously.  Confirm no history of polyps and no family history of polyps or colon cancer.  If no personal history of polyps and no family history of colon polyps or colon cancer, ok to do cologuard.  If personal or family history, recommend colonoscopy.

## 2020-03-16 NOTE — Telephone Encounter (Signed)
Pt wants to know about a cream that was supposed to be called in? And also would like to know about Cologuard-please advise

## 2020-03-17 NOTE — Telephone Encounter (Signed)
Patient stated that she does not have any family issues or personal history but discussed doing cologuard due to her having lower back surgery and causing bowel issues in the past. She chooses not to do colonoscopy. I have ordered her cologuard through the portal .

## 2020-03-29 ENCOUNTER — Telehealth: Payer: Self-pay

## 2020-03-29 NOTE — Telephone Encounter (Signed)
Patient dropped off blood pressure cuff at the front desk for me to check per meredith. I have called patient to let her know that we have no way to calibrate the machines but she can bring her cuff to her next appt with Dr Nicki Reaper in July and I will check her BP wit h my cuff and then with hers. Patient is going to come pick her machines back up.

## 2020-03-29 NOTE — Telephone Encounter (Signed)
Placed bp cuffs up front in cabinet for pick up

## 2020-04-06 DIAGNOSIS — G4733 Obstructive sleep apnea (adult) (pediatric): Secondary | ICD-10-CM | POA: Diagnosis not present

## 2020-04-20 ENCOUNTER — Telehealth: Payer: Self-pay | Admitting: Internal Medicine

## 2020-04-20 MED ORDER — AMLODIPINE BESYLATE 5 MG PO TABS: 5.0000 mg | ORAL_TABLET | Freq: Every day | ORAL | 1 refills | Status: DC

## 2020-04-20 NOTE — Telephone Encounter (Signed)
Pt called in need refill on amLODipine (NORVASC) 5 MG tablet 

## 2020-04-25 ENCOUNTER — Telehealth: Payer: Self-pay | Admitting: Internal Medicine

## 2020-04-25 NOTE — Telephone Encounter (Signed)
BP readings and forms placed in quick sign

## 2020-04-25 NOTE — Telephone Encounter (Signed)
Patient dropped off bp readings and a handicapp renewal form. Both are up front in color folder.

## 2020-04-26 NOTE — Telephone Encounter (Signed)
Blood pressures reviewed.  Varying.  Still overall appear to be higher than goal.  She has an appt with me 04/27/20.  Confirm no other acute symptoms and confirm current medications taking (hctz 25mg  q day, losartan 50mg  q day and amlodipine 5mg  q day). Has she had problems with any of these medications.  May need to increase either losartan or amlodipine for better blood pressure control.  Regarding her form, will need to confirm need for handicap form.

## 2020-04-27 ENCOUNTER — Ambulatory Visit (INDEPENDENT_AMBULATORY_CARE_PROVIDER_SITE_OTHER): Payer: Medicare Other | Admitting: Internal Medicine

## 2020-04-27 ENCOUNTER — Other Ambulatory Visit: Payer: Self-pay

## 2020-04-27 DIAGNOSIS — R7303 Prediabetes: Secondary | ICD-10-CM | POA: Diagnosis not present

## 2020-04-27 DIAGNOSIS — I10 Essential (primary) hypertension: Secondary | ICD-10-CM | POA: Diagnosis not present

## 2020-04-27 DIAGNOSIS — E785 Hyperlipidemia, unspecified: Secondary | ICD-10-CM

## 2020-04-27 DIAGNOSIS — F320 Major depressive disorder, single episode, mild: Secondary | ICD-10-CM

## 2020-04-27 DIAGNOSIS — I6523 Occlusion and stenosis of bilateral carotid arteries: Secondary | ICD-10-CM

## 2020-04-27 DIAGNOSIS — G4730 Sleep apnea, unspecified: Secondary | ICD-10-CM

## 2020-04-27 DIAGNOSIS — F32A Depression, unspecified: Secondary | ICD-10-CM

## 2020-04-27 MED ORDER — NYSTATIN 100000 UNIT/GM EX CREA: 1.0000 "application " | TOPICAL_CREAM | Freq: Two times a day (BID) | CUTANEOUS | 0 refills | Status: AC

## 2020-04-27 MED ORDER — AMLODIPINE BESYLATE 5 MG PO TABS: 5.0000 mg | ORAL_TABLET | Freq: Every day | ORAL | 1 refills | Status: DC

## 2020-04-27 MED ORDER — LOSARTAN POTASSIUM 100 MG PO TABS
100.0000 mg | ORAL_TABLET | Freq: Every day | ORAL | 3 refills | Status: DC
Start: 2020-04-27 — End: 2020-11-02

## 2020-04-27 NOTE — Telephone Encounter (Signed)
Discussed with pt at appt. Confirmed taking meds as listed. Completed reason for handicap form.

## 2020-04-27 NOTE — Progress Notes (Signed)
Patient ID: Amber Bridges, female   DOB: [birth year], 79 y.o.   MRN: [id number]   Subjective:    Patient ID: Amber Bridges, female    DOB: [birth year], 79 y.o.   MRN: [id number]  HPI This visit occurred during the SARS-CoV-2 public health emergency.  Safety protocols were in place, including screening questions prior to the visit, additional usage of staff PPE, and extensive cleaning of exam room while observing appropriate contact time as indicated for disinfecting solutions.  Patient here for a scheduled follow up.  States she is doing relatively well. Using cpap.  Tries to stay active.  No chest pain.  Breathing stable.  No acid reflux reported.  No abdominal pain or bowel change reported.  Blood pressure remains elevated.  Discussed further treatment.   Saw [facility].  Recommended f/u in one year.   Past Medical History:  Diagnosis Date  . Arthritis   . Bronchitis   . Coronary artery disease   . Depression   . HOH (hard of hearing)    AIDS  . Hypertension   . Sleep apnea    CPAP   Past Surgical History:  Procedure Laterality Date  . ABDOMINAL HYSTERECTOMY    . APPENDECTOMY    . BACK SURGERY     X 3  . CATARACT EXTRACTION W/ INTRAOCULAR LENS IMPLANT Right 2017  . CATARACT EXTRACTION W/PHACO Left 08/08/2016   Procedure: CATARACT EXTRACTION PHACO AND INTRAOCULAR LENS PLACEMENT (IOC);  Surgeon: Sallee Lange, MD;  Location: [facility] OR[facility];  Service: Ophthalmology;  Laterality: Left;  fluid lot # [id number]  exp02/28/2019 [place]    01:15.3 AP%   25.2 CDE   35.06   . CTR Right   . FOOT SURGERY    . JOINT REPLACEMENT     TKR  . KNEE ARTHROPLASTY Left 11/24/2018   Procedure: COMPUTER ASSISTED TOTAL KNEE ARTHROPLASTY-LEFT;  Surgeon: Donato Heinz, MD;  Location: [facility] OR[facility];  Service: Orthopedics;  Laterality: Left;  . NOSE SURGERY    . RCR Left   . SHOULDER ARTHROSCOPY Right    for bursitis  . SPINE SURGERY  1973, 1978, 2016   Most recent Laminectomy to T11 and L3   Family  History  Problem Relation Age of Onset  . Congestive Heart Failure Mother   . Diabetes Father   . Congestive Heart Failure Father   . Congestive Heart Failure Sister   . Congestive Heart Failure Brother    Social History   Socioeconomic History  . Marital status: Married    Spouse name: Not on file  . Number of children: Not on file  . Years of education: Not on file  . Highest education level: Not on file  Occupational History  . Not on file  Tobacco Use  . Smoking status: Former Smoker    Quit date: 10/08/1960    Years since quitting: 59.6  . Smokeless tobacco: Never Used  Vaping Use  . Vaping Use: Never used  Substance and Sexual Activity  . Alcohol use: Yes    Comment: 2 cocktails  . Drug use: Never  . Sexual activity: Not on file  Other Topics Concern  . Not on file  Social History Narrative  . Not on file   Social Determinants of Health   Financial Resource Strain:   . Difficulty of Paying Living Expenses:   Food Insecurity:   . Worried About Programme researcher, broadcasting/film/video in the Last Year:   . [employer]  in the Last Year:   Transportation Needs:   . Freight forwarder (Medical):   Marland Kitchen Lack of Transportation (Non-Medical):   Physical Activity:   . Days of Exercise per Week:   . Minutes of Exercise per Session:   Stress:   . Feeling of Stress :   Social Connections:   . Frequency of Communication with Friends and Family:   . Frequency of Social Gatherings with Friends and Family:   . Attends Religious Services:   . Active Member of Clubs or Organizations:   . Attends Banker Meetings:   Marland Kitchen Marital Status:     Outpatient Encounter Medications as of 04/27/2020  Medication Sig  . acetaminophen (TYLENOL) 500 MG tablet Take 500 mg by mouth every 6 (six) hours as needed.  Marland Kitchen amLODipine (NORVASC) 5 MG tablet Take 1 tablet (5 mg total) by mouth daily.  Marland Kitchen aspirin 81 MG EC tablet Take 81 mg by mouth daily.  Marland Kitchen atorvastatin (LIPITOR) 40 MG tablet Take 1  tablet by mouth at bedtime.  . Coenzyme Q10 (COQ10) 200 MG CAPS Take 1 capsule by mouth daily.  . Doxylamine Succinate, Sleep, (SLEEP AID PO) Take 1 tablet by mouth at bedtime as needed (sleep).  Marland Kitchen FLUoxetine (PROZAC) 40 MG capsule Take 1 capsule by mouth daily.  . hydrochlorothiazide (HYDRODIURIL) 25 MG tablet Take 1 tablet by mouth every evening.   . Multiple Vitamins-Minerals (CENTRUM SILVER PO) Take 1 tablet by mouth daily.  Marland Kitchen nystatin cream (MYCOSTATIN) Apply 1 application topically 2 (two) times daily.  . Polyethylene Glycol 3350 (MIRALAX PO) Take 17 g by mouth daily as needed (constipation).  Marland Kitchen senna (SENOKOT) 8.6 MG tablet Take 1 tablet by mouth daily.  . traZODone (DESYREL) 50 MG tablet Take 1 tablet by mouth at bedtime as needed for sleep.   . [DISCONTINUED] amLODipine (NORVASC) 5 MG tablet Take 1 tablet (5 mg total) by mouth daily.  . [DISCONTINUED] losartan (COZAAR) 50 MG tablet Take 1 tablet by mouth every evening.   . [DISCONTINUED] nystatin cream (MYCOSTATIN) Apply 1 application topically 2 (two) times daily.  Marland Kitchen losartan (COZAAR) 100 MG tablet Take 1 tablet (100 mg total) by mouth daily.   No facility-administered encounter medications on file as of 04/27/2020.    Review of Systems  Constitutional: Negative for appetite change and unexpected weight change.  HENT: Negative for congestion and sinus pressure.   Respiratory: Negative for cough and chest tightness.        Breathing stable.   Cardiovascular: Negative for chest pain, palpitations and leg swelling.  Gastrointestinal: Negative for abdominal pain, diarrhea, nausea and vomiting.  Genitourinary: Negative for difficulty urinating and dysuria.  Musculoskeletal: Negative for joint swelling and myalgias.  Skin: Negative for color change and rash.  Neurological: Negative for dizziness, light-headedness and headaches.  Psychiatric/Behavioral: Negative for agitation and dysphoric mood.       Objective:    Physical  Exam Vitals reviewed.  Constitutional:      General: She is not in acute distress.    Appearance: Normal appearance.  HENT:     Head: Normocephalic and atraumatic.     Right Ear: External ear normal.     Left Ear: External ear normal.  Eyes:     General: No scleral icterus.       Right eye: No discharge.        Left eye: No discharge.     Conjunctiva/sclera: Conjunctivae normal.  Neck:     Thyroid: No  thyromegaly.  Cardiovascular:     Rate and Rhythm: Normal rate and regular rhythm.  Pulmonary:     Effort: No respiratory distress.     Breath sounds: Normal breath sounds. No wheezing.  Abdominal:     General: Bowel sounds are normal.     Palpations: Abdomen is soft.     Tenderness: There is no abdominal tenderness.  Musculoskeletal:        General: No swelling or tenderness.     Cervical back: Neck supple. No tenderness.  Lymphadenopathy:     Cervical: No cervical adenopathy.  Skin:    Findings: No erythema or rash.  Neurological:     Mental Status: She is alert.  Psychiatric:        Mood and Affect: Mood normal.        Behavior: Behavior normal.     BP 136/72   Pulse (!) 58   Temp 98.5 F (36.9 C)   Resp 16   Ht 5\' 5"  (1.651 m)   Wt 242 lb (109.8 kg)   SpO2 98%   BMI 40.27 kg/m  Wt Readings from Last 3 Encounters:  04/27/20 242 lb (109.8 kg)  03/03/20 241 lb (109.3 kg)  10/29/19 240 lb (108.9 kg)   Blood pressure rechecked by me:  146/72  Lab Results  Component Value Date   WBC 4.6 02/29/2020   HGB 13.0 02/29/2020   HCT 38.8 02/29/2020   PLT 194.0 02/29/2020   GLUCOSE 95 02/29/2020   CHOL 150 02/29/2020   TRIG 67.0 02/29/2020   HDL 62.00 02/29/2020   LDLCALC 74 02/29/2020   ALT 16 02/29/2020   AST 17 02/29/2020   NA 138 02/29/2020   K 4.2 02/29/2020   CL 103 02/29/2020   CREATININE 1.15 02/29/2020   BUN 25 (H) 02/29/2020   CO2 27 02/29/2020   TSH 2.90 02/29/2020   INR 0.96 11/12/2018   HGBA1C 5.7 02/29/2020       Assessment & Plan:    Problem List Items Addressed This Visit    Bilateral carotid artery stenosis    Saw [facility] 03/2019.  Has f/u scheduled.        Relevant Medications   amLODipine (NORVASC) 5 MG tablet   losartan (COZAAR) 100 MG tablet   Borderline diabetes mellitus    Low carb diet and exercise.  Follow met b and a1c.       Relevant Orders   Hemoglobin A1c   Essential hypertension    Blood pressure remains elevated.  Continue hctz and amlodipine.  Increase losartan to 100mg  q day.  Follow pressures.  Follow metabolic panel.       Relevant Medications   amLODipine (NORVASC) 5 MG tablet   losartan (COZAAR) 100 MG tablet   Other Relevant Orders   Basic metabolic panel   Hyperlipidemia    On lipitor.  Low cholesterol diet and exercise.  Follow lipid panel and liver function tests.        Relevant Medications   amLODipine (NORVASC) 5 MG tablet   losartan (COZAAR) 100 MG tablet   Other Relevant Orders   Hepatic function panel   Lipid panel   Mild depression (HCC)    On prozac.  Stable.        Sleep apnea    Using cpap regularly.            Dale [place], MD

## 2020-05-01 ENCOUNTER — Encounter: Payer: Self-pay | Admitting: Internal Medicine

## 2020-05-01 NOTE — Assessment & Plan Note (Signed)
On prozac.  Stable.   

## 2020-05-01 NOTE — Assessment & Plan Note (Signed)
Blood pressure remains elevated.  Continue hctz and amlodipine.  Increase losartan to 100mg  q day.  Follow pressures.  Follow metabolic panel.

## 2020-05-01 NOTE — Assessment & Plan Note (Addendum)
Saw [facility] 03/2019.  Has f/u scheduled.

## 2020-05-01 NOTE — Assessment & Plan Note (Signed)
Using cpap regularly   

## 2020-05-01 NOTE — Assessment & Plan Note (Signed)
Low carb diet and exercise.  Follow met b and a1c.  

## 2020-05-01 NOTE — Assessment & Plan Note (Signed)
On lipitor.  Low cholesterol diet and exercise.  Follow lipid panel and liver function tests.   

## 2020-05-06 DIAGNOSIS — G4733 Obstructive sleep apnea (adult) (pediatric): Secondary | ICD-10-CM | POA: Diagnosis not present

## 2020-06-06 ENCOUNTER — Other Ambulatory Visit: Payer: Self-pay

## 2020-06-06 ENCOUNTER — Other Ambulatory Visit (INDEPENDENT_AMBULATORY_CARE_PROVIDER_SITE_OTHER): Payer: Medicare Other

## 2020-06-06 DIAGNOSIS — I10 Essential (primary) hypertension: Secondary | ICD-10-CM

## 2020-06-06 DIAGNOSIS — E785 Hyperlipidemia, unspecified: Secondary | ICD-10-CM | POA: Diagnosis not present

## 2020-06-06 DIAGNOSIS — R7303 Prediabetes: Secondary | ICD-10-CM | POA: Diagnosis not present

## 2020-06-06 DIAGNOSIS — G4733 Obstructive sleep apnea (adult) (pediatric): Secondary | ICD-10-CM | POA: Diagnosis not present

## 2020-06-06 LAB — BASIC METABOLIC PANEL
BUN: 30 mg/dL — ABNORMAL HIGH (ref 6–23)
CO2: 28 mEq/L (ref 19–32)
Calcium: 9.8 mg/dL (ref 8.4–10.5)
Chloride: 103 mEq/L (ref 96–112)
Creatinine, Ser: 1.15 mg/dL (ref 0.40–1.20)
GFR: 45.48 mL/min — ABNORMAL LOW (ref >60.00)
Glucose, Bld: 106 mg/dL — ABNORMAL HIGH (ref 70–99)
Potassium: 4.3 mEq/L (ref 3.5–5.1)
Sodium: 139 mEq/L (ref 135–145)

## 2020-06-06 LAB — HEPATIC FUNCTION PANEL
ALT: 16 U/L (ref 0–35)
AST: 18 U/L (ref 0–37)
Albumin: 4.4 g/dL (ref 3.5–5.2)
Alkaline Phosphatase: 70 U/L (ref 39–117)
Bilirubin, Direct: 0.2 mg/dL (ref 0.0–0.3)
Total Bilirubin: 0.7 mg/dL (ref 0.2–1.2)
Total Protein: 7.3 g/dL (ref 6.0–8.3)

## 2020-06-06 LAB — LIPID PANEL
Cholesterol: 151 mg/dL (ref 0–200)
HDL: 66 mg/dL (ref >39.00)
LDL Cholesterol: 72 mg/dL (ref 0–99)
NonHDL: 84.99
Total CHOL/HDL Ratio: 2
Triglycerides: 64 mg/dL (ref 0.0–149.0)
VLDL: 12.8 mg/dL (ref 0.0–40.0)

## 2020-06-06 LAB — HEMOGLOBIN A1C: Hgb A1c MFr Bld: 5.8 % (ref 4.6–6.5)

## 2020-06-09 ENCOUNTER — Ambulatory Visit (INDEPENDENT_AMBULATORY_CARE_PROVIDER_SITE_OTHER): Payer: Medicare Other | Admitting: Internal Medicine

## 2020-06-09 ENCOUNTER — Other Ambulatory Visit: Payer: Self-pay

## 2020-06-09 DIAGNOSIS — Z23 Encounter for immunization: Secondary | ICD-10-CM | POA: Diagnosis not present

## 2020-06-09 DIAGNOSIS — R200 Anesthesia of skin: Secondary | ICD-10-CM

## 2020-06-09 DIAGNOSIS — I10 Essential (primary) hypertension: Secondary | ICD-10-CM

## 2020-06-09 DIAGNOSIS — G4730 Sleep apnea, unspecified: Secondary | ICD-10-CM

## 2020-06-09 DIAGNOSIS — E785 Hyperlipidemia, unspecified: Secondary | ICD-10-CM | POA: Diagnosis not present

## 2020-06-09 DIAGNOSIS — F32A Depression, unspecified: Secondary | ICD-10-CM

## 2020-06-09 DIAGNOSIS — F320 Major depressive disorder, single episode, mild: Secondary | ICD-10-CM | POA: Diagnosis not present

## 2020-06-09 MED ORDER — AMLODIPINE BESYLATE 5 MG PO TABS
ORAL_TABLET | ORAL | 1 refills | Status: DC
Start: 2020-06-09 — End: 2020-06-09

## 2020-06-09 MED ORDER — AMLODIPINE BESYLATE 5 MG PO TABS: ORAL_TABLET | ORAL | 1 refills | Status: DC

## 2020-06-09 MED ORDER — TRAZODONE HCL 50 MG PO TABS
ORAL_TABLET | ORAL | 1 refills | Status: DC
Start: 2020-06-09 — End: 2023-02-13

## 2020-06-09 NOTE — Progress Notes (Signed)
Patient ID: Amber Bridges, female   DOB: [birth year], 79 y.o.   MRN: [id number]   Subjective:    Patient ID: Amber Bridges, female    DOB: 09/29/1941, 79 y.o.   MRN: [id number]  HPI This visit occurred during the SARS-CoV-2 public health emergency.  Safety protocols were in place, including screening questions prior to the visit, additional usage of staff PPE, and extensive cleaning of exam room while observing appropriate contact time as indicated for disinfecting solutions.  Patient here for a scheduled follow up. Increased stress.  Stress with family medical issues, roof issues and foundation problems (house issues).  Discussed with her today.  Discussed sleep.  Discussed trazodone.  No chest pain reported.  Breathing stable.  No abdominal pain or bowel change reported.  She is having left hand numbness.  Wearing splint.  Discussed further w/up - including NCS.  She is agreeable.  Blood pressure remains elevated.  increased losartan to 100mg  last visit.  Discussed increasing amlodipine.     Past Medical History:  Diagnosis Date  . Arthritis   . Bronchitis   . Coronary artery disease   . Depression   . HOH (hard of hearing)    AIDS  . Hypertension   . Sleep apnea    CPAP   Past Surgical History:  Procedure Laterality Date  . ABDOMINAL HYSTERECTOMY    . APPENDECTOMY    . BACK SURGERY     X 3  . CATARACT EXTRACTION W/ INTRAOCULAR LENS IMPLANT Right 2017  . CATARACT EXTRACTION W/PHACO Left 08/08/2016   Procedure: CATARACT EXTRACTION PHACO AND INTRAOCULAR LENS PLACEMENT (IOC);  Surgeon: Sallee Lange, MD;  Location: [facility];  Service: Ophthalmology;  Laterality: Left;  fluid lot # [id number]  exp02/28/2019 [place]    01:15.3 AP%   25.2 CDE   35.06   . CTR Right   . FOOT SURGERY    . JOINT REPLACEMENT     TKR  . KNEE ARTHROPLASTY Left 11/24/2018   Procedure: COMPUTER ASSISTED TOTAL KNEE ARTHROPLASTY-LEFT;  Surgeon: Donato Heinz, MD;  Location: [facility];  Service:  Orthopedics;  Laterality: Left;  . NOSE SURGERY    . RCR Left   . SHOULDER ARTHROSCOPY Right    for bursitis  . SPINE SURGERY  1973, 1978, 2016   Most recent Laminectomy to T11 and L3   Family History  Problem Relation Age of Onset  . Congestive Heart Failure Mother   . Diabetes Father   . Congestive Heart Failure Father   . Congestive Heart Failure Sister   . Congestive Heart Failure Brother    Social History   Socioeconomic History  . Marital status: Married    Spouse name: Not on file  . Number of children: Not on file  . Years of education: Not on file  . Highest education level: Not on file  Occupational History  . Not on file  Tobacco Use  . Smoking status: Former Smoker    Quit date: 10/08/1960    Years since quitting: 59.7  . Smokeless tobacco: Never Used  Vaping Use  . Vaping Use: Never used  Substance and Sexual Activity  . Alcohol use: Yes    Comment: 2 cocktails  . Drug use: Never  . Sexual activity: Not on file  Other Topics Concern  . Not on file  Social History Narrative  . Not on file   Social Determinants of Health   Financial Resource Strain:   . Difficulty of Paying  Living Expenses: Not on file  Food Insecurity:   . Worried About Programme researcher, broadcasting/film/video in the Last Year: Not on file  . Ran Out of Food in the Last Year: Not on file  Transportation Needs:   . Lack of Transportation (Medical): Not on file  . Lack of Transportation (Non-Medical): Not on file  Physical Activity:   . Days of Exercise per Week: Not on file  . Minutes of Exercise per Session: Not on file  Stress:   . Feeling of Stress : Not on file  Social Connections:   . Frequency of Communication with Friends and Family: Not on file  . Frequency of Social Gatherings with Friends and Family: Not on file  . Attends Religious Services: Not on file  . Active Member of Clubs or Organizations: Not on file  . Attends Banker Meetings: Not on file  . Marital Status: Not on  file    Outpatient Encounter Medications as of 06/09/2020  Medication Sig  . acetaminophen (TYLENOL) 500 MG tablet Take 500 mg by mouth every 6 (six) hours as needed.  Marland Kitchen amLODipine (NORVASC) 5 MG tablet Take one tablet bid  . aspirin 81 MG EC tablet Take 81 mg by mouth daily.  Marland Kitchen atorvastatin (LIPITOR) 40 MG tablet Take 1 tablet by mouth at bedtime.  . Coenzyme Q10 (COQ10) 200 MG CAPS Take 1 capsule by mouth daily.  . Doxylamine Succinate, Sleep, (SLEEP AID PO) Take 1 tablet by mouth at bedtime as needed (sleep).  Marland Kitchen FLUoxetine (PROZAC) 40 MG capsule Take 1 capsule by mouth daily.  . hydrochlorothiazide (HYDRODIURIL) 25 MG tablet Take 1 tablet by mouth every evening.   Marland Kitchen losartan (COZAAR) 100 MG tablet Take 1 tablet (100 mg total) by mouth daily.  . Multiple Vitamins-Minerals (CENTRUM SILVER PO) Take 1 tablet by mouth daily.  Marland Kitchen nystatin cream (MYCOSTATIN) Apply 1 application topically 2 (two) times daily.  . Polyethylene Glycol 3350 (MIRALAX PO) Take 17 g by mouth daily as needed (constipation).  Marland Kitchen senna (SENOKOT) 8.6 MG tablet Take 1 tablet by mouth daily.  . traZODone (DESYREL) 50 MG tablet 1/2 tablet q hs prn  . [DISCONTINUED] amLODipine (NORVASC) 5 MG tablet Take 1 tablet (5 mg total) by mouth daily.  . [DISCONTINUED] amLODipine (NORVASC) 5 MG tablet Take one tablet bid  . [DISCONTINUED] traZODone (DESYREL) 50 MG tablet Take 1 tablet by mouth at bedtime as needed for sleep.    No facility-administered encounter medications on file as of 06/09/2020.    Review of Systems  Constitutional: Negative for appetite change and unexpected weight change.  HENT: Negative for congestion and sinus pressure.   Respiratory: Negative for cough, chest tightness and shortness of breath.   Cardiovascular: Negative for chest pain, palpitations and leg swelling.  Gastrointestinal: Negative for abdominal pain, diarrhea, nausea and vomiting.  Genitourinary: Negative for difficulty urinating and dysuria.    Musculoskeletal: Negative for joint swelling and myalgias.  Skin: Negative for color change and rash.  Neurological: Negative for dizziness, light-headedness and headaches.  Psychiatric/Behavioral: Positive for sleep disturbance. Negative for agitation and dysphoric mood.       Increased stress.        Objective:    Physical Exam Vitals reviewed.  Constitutional:      General: She is not in acute distress.    Appearance: Normal appearance.  HENT:     Head: Normocephalic and atraumatic.     Right Ear: External ear normal.  Left Ear: External ear normal.  Eyes:     General: No scleral icterus.       Right eye: No discharge.        Left eye: No discharge.     Conjunctiva/sclera: Conjunctivae normal.  Neck:     Thyroid: No thyromegaly.  Cardiovascular:     Rate and Rhythm: Normal rate and regular rhythm.  Pulmonary:     Effort: No respiratory distress.     Breath sounds: Normal breath sounds. No wheezing.  Abdominal:     General: Bowel sounds are normal.     Palpations: Abdomen is soft.     Tenderness: There is no abdominal tenderness.  Musculoskeletal:        General: No swelling or tenderness.     Cervical back: Neck supple. No tenderness.  Lymphadenopathy:     Cervical: No cervical adenopathy.  Skin:    Findings: No erythema or rash.  Neurological:     Mental Status: She is alert.  Psychiatric:        Mood and Affect: Mood normal.        Behavior: Behavior normal.     BP (!) 150/76   Pulse (!) 56   Temp 98.4 F (36.9 C) (Oral)   Resp 16   Ht 5\' 5"  (1.651 m)   Wt 244 lb 9.6 oz (110.9 kg)   SpO2 97%   BMI 40.70 kg/m  Wt Readings from Last 3 Encounters:  06/09/20 244 lb 9.6 oz (110.9 kg)  04/27/20 242 lb (109.8 kg)  03/03/20 241 lb (109.3 kg)     Lab Results  Component Value Date   WBC 4.6 02/29/2020   HGB 13.0 02/29/2020   HCT 38.8 02/29/2020   PLT 194.0 02/29/2020   GLUCOSE 106 (H) 06/06/2020   CHOL 151 06/06/2020   TRIG 64.0 06/06/2020    HDL 66.00 06/06/2020   LDLCALC 72 06/06/2020   ALT 16 06/06/2020   AST 18 06/06/2020   NA 139 06/06/2020   K 4.3 06/06/2020   CL 103 06/06/2020   CREATININE 1.15 06/06/2020   BUN 30 (H) 06/06/2020   CO2 28 06/06/2020   TSH 2.90 02/29/2020   INR 0.96 11/12/2018   HGBA1C 5.8 06/06/2020       Assessment & Plan:   Problem List Items Addressed This Visit    Sleep apnea    Continue cpap.       Numbness of left hand    Has tried splint.  Persistent issue.  Schedule NCS.       Relevant Orders   Ambulatory referral to Neurology   Mild depression (HCC)    On prozac.  Sleep issues.  Discussed trazodone.  Follow.       Relevant Medications   traZODone (DESYREL) 50 MG tablet   Hyperlipidemia    On lipitor.  Low cholesterol diet and exercise.  Follow lipid panel and liver function tests.        Relevant Medications   amLODipine (NORVASC) 5 MG tablet   Essential hypertension    Blood pressure remains elevated.  Continue losartan 100mg  q day, hctz 25mg  q day.  Increased amlodipine to 5mg  bid.  Follow pressures.  Follow metabolic panel.       Relevant Medications   amLODipine (NORVASC) 5 MG tablet    Other Visit Diagnoses    Need for immunization against influenza       Relevant Orders   Flu Vaccine QUAD High Dose(Fluad) (Completed)  Dale Dana, MD

## 2020-06-15 DIAGNOSIS — G4733 Obstructive sleep apnea (adult) (pediatric): Secondary | ICD-10-CM | POA: Diagnosis not present

## 2020-06-19 ENCOUNTER — Encounter: Payer: Self-pay | Admitting: Internal Medicine

## 2020-06-19 DIAGNOSIS — R200 Anesthesia of skin: Secondary | ICD-10-CM | POA: Insufficient documentation

## 2020-06-19 NOTE — Assessment & Plan Note (Signed)
On prozac.  Sleep issues.  Discussed trazodone.  Follow.

## 2020-06-19 NOTE — Assessment & Plan Note (Signed)
Has tried splint.  Persistent issue.  Schedule NCS.

## 2020-06-19 NOTE — Assessment & Plan Note (Signed)
On lipitor.  Low cholesterol diet and exercise.  Follow lipid panel and liver function tests.   

## 2020-06-19 NOTE — Assessment & Plan Note (Signed)
Continue cpap.  

## 2020-06-19 NOTE — Assessment & Plan Note (Signed)
Blood pressure remains elevated.  Continue losartan 100mg  q day, hctz 25mg  q day.  Increased amlodipine to 5mg  bid.  Follow pressures.  Follow metabolic panel.

## 2020-07-07 DIAGNOSIS — G4733 Obstructive sleep apnea (adult) (pediatric): Secondary | ICD-10-CM | POA: Diagnosis not present

## 2020-08-01 ENCOUNTER — Encounter: Payer: Self-pay | Admitting: Internal Medicine

## 2020-08-01 ENCOUNTER — Other Ambulatory Visit: Payer: Self-pay

## 2020-08-01 ENCOUNTER — Telehealth: Payer: Self-pay

## 2020-08-01 ENCOUNTER — Ambulatory Visit (INDEPENDENT_AMBULATORY_CARE_PROVIDER_SITE_OTHER): Payer: Medicare Other | Admitting: Internal Medicine

## 2020-08-01 VITALS — BP 136/68 | HR 58 | Temp 98.1°F | Ht 65.0 in | Wt 250.4 lb

## 2020-08-01 DIAGNOSIS — R739 Hyperglycemia, unspecified: Secondary | ICD-10-CM | POA: Diagnosis not present

## 2020-08-01 DIAGNOSIS — G4730 Sleep apnea, unspecified: Secondary | ICD-10-CM

## 2020-08-01 DIAGNOSIS — F320 Major depressive disorder, single episode, mild: Secondary | ICD-10-CM

## 2020-08-01 DIAGNOSIS — Z789 Other specified health status: Secondary | ICD-10-CM

## 2020-08-01 DIAGNOSIS — I6523 Occlusion and stenosis of bilateral carotid arteries: Secondary | ICD-10-CM

## 2020-08-01 DIAGNOSIS — R7303 Prediabetes: Secondary | ICD-10-CM

## 2020-08-01 DIAGNOSIS — F32A Depression, unspecified: Secondary | ICD-10-CM

## 2020-08-01 DIAGNOSIS — E785 Hyperlipidemia, unspecified: Secondary | ICD-10-CM | POA: Diagnosis not present

## 2020-08-01 DIAGNOSIS — I10 Essential (primary) hypertension: Secondary | ICD-10-CM

## 2020-08-01 DIAGNOSIS — M65331 Trigger finger, right middle finger: Secondary | ICD-10-CM

## 2020-08-01 DIAGNOSIS — M25561 Pain in right knee: Secondary | ICD-10-CM

## 2020-08-01 NOTE — Progress Notes (Signed)
Patient ID: Amber Bridges, female   DOB: [birth year], 79 y.o.   MRN: [id number]   Subjective:    Patient ID: Amber Bridges, female    DOB: 28-Jan-1941, 79 y.o.   MRN: [id number]  HPI This visit occurred during the SARS-CoV-2 public health emergency.  Safety protocols were in place, including screening questions prior to the visit, additional usage of staff PPE, and extensive cleaning of exam room while observing appropriate contact time as indicated for disinfecting solutions.  Patient here for a scheduled follow up.  Here to f/u regarding her blood pressure.  Outside checks reviewed and range 118-156/64-76.  (the 156/69 reading was just after increasing amlodipine to 5mg  bid).  Better since.  No chest pain or sob.  No abdominal pain or cramping.  No increased cough or congestion.  She did fall recently.  Hit her right shoulder - on way down.  Also injured her right hip and right knee.  Knee still hurts when stands.  No head injury.  Able to move her shoulder without increased pain.  She does have a right third trigger finger.  Discussed ortho evaluation.  Is bothering her.  Handling stress.  Regarding her stress, she feels she is doing better than she has in a while.    Past Medical History:  Diagnosis Date  . Arthritis   . Bronchitis   . Coronary artery disease   . Depression   . HOH (hard of hearing)    AIDS  . Hypertension   . Sleep apnea    CPAP   Past Surgical History:  Procedure Laterality Date  . ABDOMINAL HYSTERECTOMY    . APPENDECTOMY    . BACK SURGERY     X 3  . CATARACT EXTRACTION W/ INTRAOCULAR LENS IMPLANT Right 2017  . CATARACT EXTRACTION W/PHACO Left 08/08/2016   Procedure: CATARACT EXTRACTION PHACO AND INTRAOCULAR LENS PLACEMENT (IOC);  Surgeon: Sallee Lange, MD;  Location: [facility];  Service: Ophthalmology;  Laterality: Left;  fluid lot # [id number]  exp02/28/2019 [place]    01:15.3 AP%   25.2 CDE   35.06   . CTR Right   . FOOT SURGERY    . JOINT  REPLACEMENT     TKR  . KNEE ARTHROPLASTY Left 11/24/2018   Procedure: COMPUTER ASSISTED TOTAL KNEE ARTHROPLASTY-LEFT;  Surgeon: Donato Heinz, MD;  Location: [facility] OR[facility];  Service: Orthopedics;  Laterality: Left;  . NOSE SURGERY    . RCR Left   . SHOULDER ARTHROSCOPY Right    for bursitis  . SPINE SURGERY  1973, 1978, 2016   Most recent Laminectomy to T11 and L3   Family History  Problem Relation Age of Onset  . Congestive Heart Failure Mother   . Diabetes Father   . Congestive Heart Failure Father   . Congestive Heart Failure Sister   . Congestive Heart Failure Brother    Social History   Socioeconomic History  . Marital status: Married    Spouse name: Not on file  . Number of children: Not on file  . Years of education: Not on file  . Highest education level: Not on file  Occupational History  . Not on file  Tobacco Use  . Smoking status: Former Smoker    Quit date: 10/08/1960    Years since quitting: 59.8  . Smokeless tobacco: Never Used  Vaping Use  . Vaping Use: Never used  Substance and Sexual Activity  . Alcohol use: Yes    Comment: 2 cocktails  .  Drug use: Never  . Sexual activity: Not on file  Other Topics Concern  . Not on file  Social History Narrative  . Not on file   Social Determinants of Health   Financial Resource Strain:   . Difficulty of Paying Living Expenses: Not on file  Food Insecurity:   . Worried About Programme researcher, broadcasting/film/video in the Last Year: Not on file  . Ran Out of Food in the Last Year: Not on file  Transportation Needs:   . Lack of Transportation (Medical): Not on file  . Lack of Transportation (Non-Medical): Not on file  Physical Activity:   . Days of Exercise per Week: Not on file  . Minutes of Exercise per Session: Not on file  Stress:   . Feeling of Stress : Not on file  Social Connections:   . Frequency of Communication with Friends and Family: Not on file  . Frequency of Social Gatherings with Friends and Family: Not on file    . Attends Religious Services: Not on file  . Active Member of Clubs or Organizations: Not on file  . Attends Banker Meetings: Not on file  . Marital Status: Not on file    Outpatient Encounter Medications as of 08/01/2020  Medication Sig  . acetaminophen (TYLENOL) 500 MG tablet Take 500 mg by mouth every 6 (six) hours as needed.  Marland Kitchen amLODipine (NORVASC) 5 MG tablet Take one tablet bid  . aspirin 81 MG EC tablet Take 81 mg by mouth daily.  Marland Kitchen atorvastatin (LIPITOR) 40 MG tablet Take 1 tablet by mouth at bedtime.  . Coenzyme Q10 (COQ10) 200 MG CAPS Take 1 capsule by mouth daily.  . Doxylamine Succinate, Sleep, (SLEEP AID PO) Take 1 tablet by mouth at bedtime as needed (sleep).  Marland Kitchen FLUoxetine (PROZAC) 40 MG capsule Take 1 capsule by mouth daily.  . hydrochlorothiazide (HYDRODIURIL) 25 MG tablet Take 1 tablet by mouth every evening.   Marland Kitchen losartan (COZAAR) 100 MG tablet Take 1 tablet (100 mg total) by mouth daily.  . Multiple Vitamins-Minerals (CENTRUM SILVER PO) Take 1 tablet by mouth daily.  Marland Kitchen nystatin cream (MYCOSTATIN) Apply 1 application topically 2 (two) times daily.  . Polyethylene Glycol 3350 (MIRALAX PO) Take 17 g by mouth daily as needed (constipation).  Marland Kitchen senna (SENOKOT) 8.6 MG tablet Take 1 tablet by mouth daily.  . traZODone (DESYREL) 50 MG tablet 1/2 tablet q hs prn   No facility-administered encounter medications on file as of 08/01/2020.    Review of Systems  Constitutional: Negative for appetite change and unexpected weight change.  HENT: Negative for congestion and sinus pressure.   Respiratory: Negative for cough, chest tightness and shortness of breath.   Cardiovascular: Negative for chest pain, palpitations and leg swelling.  Gastrointestinal: Negative for abdominal pain, diarrhea, nausea and vomiting.  Genitourinary: Negative for difficulty urinating and dysuria.  Musculoskeletal: Negative for joint swelling and myalgias.       Right knee pain and right  third trigger finger as outlined.    Skin: Negative for color change and rash.  Neurological: Negative for dizziness, light-headedness and headaches.  Psychiatric/Behavioral: Negative for agitation and dysphoric mood.       Objective:    Physical Exam Vitals reviewed.  Constitutional:      General: She is not in acute distress.    Appearance: Normal appearance.  HENT:     Head: Normocephalic and atraumatic.     Right Ear: External ear normal.  Left Ear: External ear normal.  Eyes:     General: No scleral icterus.       Right eye: No discharge.        Left eye: No discharge.     Conjunctiva/sclera: Conjunctivae normal.  Neck:     Thyroid: No thyromegaly.  Cardiovascular:     Rate and Rhythm: Normal rate and regular rhythm.  Pulmonary:     Effort: No respiratory distress.     Breath sounds: Normal breath sounds. No wheezing.  Abdominal:     General: Bowel sounds are normal.     Palpations: Abdomen is soft.     Tenderness: There is no abdominal tenderness.  Musculoskeletal:        General: No swelling or tenderness.     Cervical back: Neck supple. No tenderness.  Lymphadenopathy:     Cervical: No cervical adenopathy.  Skin:    Findings: No erythema or rash.  Neurological:     Mental Status: She is alert.  Psychiatric:        Mood and Affect: Mood normal.        Behavior: Behavior normal.     BP 136/68   Pulse (!) 58   Temp 98.1 F (36.7 C) (Oral)   Ht 5\' 5"  (1.651 m)   Wt 250 lb 6.4 oz (113.6 kg)   SpO2 98%   BMI 41.67 kg/m  Wt Readings from Last 3 Encounters:  08/01/20 250 lb 6.4 oz (113.6 kg)  06/09/20 244 lb 9.6 oz (110.9 kg)  04/27/20 242 lb (109.8 kg)     Lab Results  Component Value Date   WBC 4.6 02/29/2020   HGB 13.0 02/29/2020   HCT 38.8 02/29/2020   PLT 194.0 02/29/2020   GLUCOSE 106 (H) 06/06/2020   CHOL 151 06/06/2020   TRIG 64.0 06/06/2020   HDL 66.00 06/06/2020   LDLCALC 72 06/06/2020   ALT 16 06/06/2020   AST 18 06/06/2020    NA 139 06/06/2020   K 4.3 06/06/2020   CL 103 06/06/2020   CREATININE 1.15 06/06/2020   BUN 30 (H) 06/06/2020   CO2 28 06/06/2020   TSH 2.90 02/29/2020   INR 0.96 11/12/2018   HGBA1C 5.8 06/06/2020       Assessment & Plan:   Problem List Items Addressed This Visit    Trigger finger    Increased pain and problems - trigger finger.  Refer to ortho.        Relevant Orders   Ambulatory referral to Orthopedic Surgery   Sleep apnea    Continue cpap.  Follow.       Right knee pain    Recent fall.  Shoulder appears to be ok.  Right knee pain as outlined.  Also with trigger finger.  Have ortho evaluate.  Sees Dr Ernest Pine - s/p knee surgery.       Relevant Orders   Ambulatory referral to Orthopedic Surgery   Mild depression (HCC)    On prozac.  Overall doing better.  Follow.        Hyperlipidemia    Low cholesterol diet and exercise.  On lipitor.  Follow lipid panel and liver function tests.        Relevant Orders   Hepatic function panel   Lipid panel   Hyperglycemia    Low carb diet and exercise.  Follow met b and a1c.       Relevant Orders   Hemoglobin A1c   Essential hypertension - Primary  Blood pressure appears to be improved.  Continue losartan, hctz and amlodipine.  Follow pressures.  Follow metabolic panel.       Relevant Orders   Basic metabolic panel   Borderline diabetes mellitus    a1c 5.8.  Low carb diet and exercise.  Follow met b and a1c.       Bilateral carotid artery stenosis    Evaluated and to be followed by [facility].        Other Visit Diagnoses    Unknown status of immunity to COVID-19 virus       Relevant Orders   SARS-CoV-2 Semi-Quantitative Total Antibody, Spike       Dale Ringgold, MD

## 2020-08-01 NOTE — Telephone Encounter (Signed)
Pt would like to be tested for Covid antibodies when she comes in for her lab appointment in December. Will Dr. Nicki Reaper please add this to her lab orders?

## 2020-08-02 DIAGNOSIS — R739 Hyperglycemia, unspecified: Secondary | ICD-10-CM | POA: Insufficient documentation

## 2020-08-02 NOTE — Assessment & Plan Note (Signed)
Low carb diet and exercise.  Follow met b and a1c.  

## 2020-08-02 NOTE — Telephone Encounter (Signed)
Called and let Amber Bridges know that we have added Covid Antibodies to her Labs. Amber Bridges verbalized understanding and had no further questions.

## 2020-08-02 NOTE — Telephone Encounter (Signed)
I have placed the order

## 2020-08-04 DIAGNOSIS — G4733 Obstructive sleep apnea (adult) (pediatric): Secondary | ICD-10-CM | POA: Diagnosis not present

## 2020-08-07 ENCOUNTER — Encounter: Payer: Self-pay | Admitting: Internal Medicine

## 2020-08-07 DIAGNOSIS — M25561 Pain in right knee: Secondary | ICD-10-CM | POA: Insufficient documentation

## 2020-08-07 DIAGNOSIS — M6530 Trigger finger, unspecified finger: Secondary | ICD-10-CM | POA: Insufficient documentation

## 2020-08-07 NOTE — Assessment & Plan Note (Signed)
a1c 5.8.  Low carb diet and exercise.  Follow met b and a1c.

## 2020-08-07 NOTE — Assessment & Plan Note (Signed)
Continue cpap.  Follow.  

## 2020-08-07 NOTE — Assessment & Plan Note (Signed)
Recent fall.  Shoulder appears to be ok.  Right knee pain as outlined.  Also with trigger finger.  Have ortho evaluate.  Sees Dr Marry Guan - s/p knee surgery.

## 2020-08-07 NOTE — Assessment & Plan Note (Signed)
On prozac.  Overall doing better.  Follow.

## 2020-08-07 NOTE — Assessment & Plan Note (Signed)
Increased pain and problems - trigger finger.  Refer to ortho.

## 2020-08-07 NOTE — Assessment & Plan Note (Signed)
Blood pressure appears to be improved.  Continue losartan, hctz and amlodipine.  Follow pressures.  Follow metabolic panel.

## 2020-08-07 NOTE — Assessment & Plan Note (Signed)
Evaluated and to be followed by [facility].

## 2020-08-07 NOTE — Assessment & Plan Note (Signed)
Low cholesterol diet and exercise.  On lipitor.  Follow lipid panel and liver function tests.   

## 2020-08-15 DIAGNOSIS — M79642 Pain in left hand: Secondary | ICD-10-CM | POA: Diagnosis not present

## 2020-08-15 DIAGNOSIS — M79641 Pain in right hand: Secondary | ICD-10-CM | POA: Diagnosis not present

## 2020-08-19 DIAGNOSIS — M25561 Pain in right knee: Secondary | ICD-10-CM | POA: Diagnosis not present

## 2020-08-19 DIAGNOSIS — M65341 Trigger finger, right ring finger: Secondary | ICD-10-CM | POA: Diagnosis not present

## 2020-08-19 DIAGNOSIS — M65331 Trigger finger, right middle finger: Secondary | ICD-10-CM | POA: Diagnosis not present

## 2020-08-19 DIAGNOSIS — Z96651 Presence of right artificial knee joint: Secondary | ICD-10-CM | POA: Diagnosis not present

## 2020-08-19 DIAGNOSIS — M25541 Pain in joints of right hand: Secondary | ICD-10-CM | POA: Diagnosis not present

## 2020-09-06 ENCOUNTER — Other Ambulatory Visit (INDEPENDENT_AMBULATORY_CARE_PROVIDER_SITE_OTHER): Payer: Self-pay | Admitting: Vascular Surgery

## 2020-09-06 DIAGNOSIS — I6523 Occlusion and stenosis of bilateral carotid arteries: Secondary | ICD-10-CM

## 2020-09-12 ENCOUNTER — Encounter (INDEPENDENT_AMBULATORY_CARE_PROVIDER_SITE_OTHER): Payer: Self-pay | Admitting: Vascular Surgery

## 2020-09-12 ENCOUNTER — Ambulatory Visit (INDEPENDENT_AMBULATORY_CARE_PROVIDER_SITE_OTHER): Payer: Medicare Other

## 2020-09-12 ENCOUNTER — Other Ambulatory Visit: Payer: Self-pay

## 2020-09-12 ENCOUNTER — Ambulatory Visit (INDEPENDENT_AMBULATORY_CARE_PROVIDER_SITE_OTHER): Payer: Medicare Other | Admitting: Vascular Surgery

## 2020-09-12 VITALS — BP 181/76 | HR 62 | Resp 16 | Wt 246.0 lb

## 2020-09-12 DIAGNOSIS — E785 Hyperlipidemia, unspecified: Secondary | ICD-10-CM

## 2020-09-12 DIAGNOSIS — I6523 Occlusion and stenosis of bilateral carotid arteries: Secondary | ICD-10-CM

## 2020-09-12 DIAGNOSIS — I10 Essential (primary) hypertension: Secondary | ICD-10-CM

## 2020-09-12 DIAGNOSIS — Z96653 Presence of artificial knee joint, bilateral: Secondary | ICD-10-CM

## 2020-09-12 NOTE — Progress Notes (Signed)
MRN : [id number]  Amber Bridges is a 79 y.o. (08-13-1941) female who presents with chief complaint of  Chief Complaint  Patient presents with  . Follow-up    ultrasound follow up  .  History of Present Illness:   The patient is seen for follow up evaluation of carotid stenosis. The carotid stenosis followed by ultrasound.   The patient denies amaurosis fugax. There is no recent history of TIA symptoms or focal motor deficits. There is no prior documented CVA.  The patient is taking enteric-coated aspirin 81 mg daily.  There is no history of migraine headaches. There is no history of seizures.  The patient has a history of coronary artery disease, no recent episodes of angina or shortness of breath. The patient denies PAD or claudication symptoms. There is a history of hyperlipidemia which is being treated with a statin.   Carotid Duplex done today shows RICA 40-59% and LICA <30%.  No change compared to last study in 03/12/2019.  Current Meds  Medication Sig  . acetaminophen (TYLENOL) 500 MG tablet Take 500 mg by mouth every 6 (six) hours as needed.  Marland Kitchen amLODipine (NORVASC) 5 MG tablet Take one tablet bid  . aspirin 81 MG EC tablet Take 81 mg by mouth daily.  Marland Kitchen atorvastatin (LIPITOR) 40 MG tablet Take 1 tablet by mouth at bedtime.  . Coenzyme Q10 (COQ10) 200 MG CAPS Take 1 capsule by mouth daily.  . Doxylamine Succinate, Sleep, (SLEEP AID PO) Take 1 tablet by mouth at bedtime as needed (sleep).  Marland Kitchen FLUoxetine (PROZAC) 40 MG capsule Take 1 capsule by mouth daily.  . hydrochlorothiazide (HYDRODIURIL) 25 MG tablet Take 1 tablet by mouth every evening.   Marland Kitchen losartan (COZAAR) 100 MG tablet Take 1 tablet (100 mg total) by mouth daily.  . Multiple Vitamins-Minerals (CENTRUM SILVER PO) Take 1 tablet by mouth daily.  Marland Kitchen nystatin cream (MYCOSTATIN) Apply 1 application topically 2 (two) times daily.  . Polyethylene Glycol 3350 (MIRALAX PO) Take 17 g by mouth daily as needed  (constipation).  Marland Kitchen senna (SENOKOT) 8.6 MG tablet Take 1 tablet by mouth daily.  . traZODone (DESYREL) 50 MG tablet 1/2 tablet q hs prn    Past Medical History:  Diagnosis Date  . Arthritis   . Bronchitis   . Coronary artery disease   . Depression   . HOH (hard of hearing)    AIDS  . Hypertension   . Sleep apnea    CPAP    Past Surgical History:  Procedure Laterality Date  . ABDOMINAL HYSTERECTOMY    . APPENDECTOMY    . BACK SURGERY     X 3  . CATARACT EXTRACTION W/ INTRAOCULAR LENS IMPLANT Right 2017  . CATARACT EXTRACTION W/PHACO Left 08/08/2016   Procedure: CATARACT EXTRACTION PHACO AND INTRAOCULAR LENS PLACEMENT (IOC);  Surgeon: Sallee Lange, MD;  Location: [facility];  Service: Ophthalmology;  Laterality: Left;  fluid lot # [id number]  exp02/28/2019 [place]    01:15.3 AP%   25.2 CDE   35.06   . CTR Right   . FOOT SURGERY    . JOINT REPLACEMENT     TKR  . KNEE ARTHROPLASTY Left 11/24/2018   Procedure: COMPUTER ASSISTED TOTAL KNEE ARTHROPLASTY-LEFT;  Surgeon: Donato Heinz, MD;  Location: [facility] OR[facility];  Service: Orthopedics;  Laterality: Left;  . NOSE SURGERY    . RCR Left   . SHOULDER ARTHROSCOPY Right    for bursitis  . SPINE SURGERY  1973, 1978,  2016   Most recent Laminectomy to T11 and L3    Social History Social History   Tobacco Use  . Smoking status: Former Smoker    Quit date: 10/08/1960    Years since quitting: 59.9  . Smokeless tobacco: Never Used  Vaping Use  . Vaping Use: Never used  Substance Use Topics  . Alcohol use: Yes    Comment: 2 cocktails  . Drug use: Never    Family History Family History  Problem Relation Age of Onset  . Congestive Heart Failure Mother   . Diabetes Father   . Congestive Heart Failure Father   . Congestive Heart Failure Sister   . Congestive Heart Failure Brother     Allergies  Allergen Reactions  . 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  Can take flu vaccine that does not relate to eggs Site of flu vaccine. Can take flu vaccine that does not relate to eggs   . Penicillins Diarrhea    Has patient had a PCN reaction causing immediate rash, facial/tongue/throat swelling, SOB or lightheadedness with hypotension: No Has patient had a PCN reaction causing severe rash involving mucus membranes or skin necrosis: No Has patient had a PCN reaction that required hospitalization No Has patient had a PCN reaction occurring within the last 10 years: No If all of the above answers are "NO", then may proceed with Cephalosporin use.   . Egg Latanya Presser, Egg] Nausea Only  . Lisinopril Cough  . Hemophilus B Polysaccharide Vaccine Other (See Comments) and Swelling    Site of flu vaccine. Can take flu vaccine that does not relate to eggs     REVIEW OF SYSTEMS (Negative unless checked)  Constitutional: [] Weight loss  [] Fever  [] Chills Cardiac: [] Chest pain   [] Chest pressure   [] Palpitations   [] Shortness of breath when laying flat   [] Shortness of breath with exertion. Vascular:  [] Pain in legs with walking   [] Pain in legs at rest  [] History of DVT   [] Phlebitis   [] Swelling in legs   [] Varicose veins   [] Non-healing ulcers Pulmonary:   [] Uses home oxygen   [] Productive cough   [] Hemoptysis   [] Wheeze  [] COPD   [] Asthma Neurologic:  [] Dizziness   [] Seizures   [] History of stroke   [] History of TIA  [] Aphasia   [] Vissual changes   [] Weakness or numbness in arm   [x] Weakness or numbness in leg Musculoskeletal:   [] Joint swelling   [x] Joint pain   [x] Low back pain Hematologic:  [] Easy bruising  [] Easy bleeding   [] Hypercoagulable state   [] Anemic Gastrointestinal:  [] Diarrhea   [] Vomiting  [] Gastroesophageal reflux/heartburn   [] Difficulty swallowing. Genitourinary:  [x] Chronic kidney disease   [] Difficult urination  [] Frequent urination   [] Blood in urine Skin:  [] Rashes   [] Ulcers  Psychological:  [] History of anxiety   []  History of  major depression.  Physical Examination  Vitals:   09/12/20 1010  BP: (!) 181/76  Pulse: 62  Resp: 16  Weight: 246 lb (111.6 kg)   Body mass index is 40.94 kg/m. Gen: WD/WN, NAD Head: [place]/AT, No temporalis wasting.  Ear/Nose/Throat: Hearing grossly intact, nares w/o erythema or drainage Eyes: PER, EOMI, sclera nonicteric.  Neck: Supple, no large masses.   Pulmonary:  Good air movement, no audible wheezing bilaterally, no use of accessory muscles.  Cardiac: RRR, no JVD Vascular:  Right carotid bruit Vessel Right Left  Radial  Palpable Palpable  Carotid Palpable Palpable  Gastrointestinal: Non-distended. No guarding/no peritoneal signs.  Musculoskeletal: M/S 5/5 throughout.  No deformity or atrophy.  Neurologic: CN 2-12 intact. Symmetrical.  Speech is fluent. Motor exam as listed above. Psychiatric: Judgment intact, Mood & affect appropriate for pt's clinical situation. Dermatologic: No rashes or ulcers noted.  No changes consistent with cellulitis.   CBC Lab Results  Component Value Date   WBC 4.6 02/29/2020   HGB 13.0 02/29/2020   HCT 38.8 02/29/2020   MCV 92.8 02/29/2020   PLT 194.0 02/29/2020    BMET    Component Value Date/Time   NA 139 06/06/2020 0809   K 4.3 06/06/2020 0809   CL 103 06/06/2020 0809   CO2 28 06/06/2020 0809   GLUCOSE 106 (H) 06/06/2020 0809   BUN 30 (H) 06/06/2020 0809   CREATININE 1.15 06/06/2020 0809   CALCIUM 9.8 06/06/2020 0809   GFRNONAA 41 (L) 11/12/2018 1148   GFRAA 47 (L) 11/12/2018 1148   CrCl cannot be calculated (Patient's most recent lab result is older than the maximum 21 days allowed.).  COAG Lab Results  Component Value Date   INR 0.96 11/12/2018    Radiology No results found.   Assessment/Plan 1. Bilateral carotid artery stenosis Recommend:  Given the patient's asymptomatic subcritical stenosis no further invasive testing or surgery at this time.  Carotid Duplex done today shows RICA 40-59% and LICA <30%.     Continue antiplatelet therapy as prescribed Continue management of CAD, HTN and Hyperlipidemia Healthy heart diet,  encouraged exercise at least 4 times per week Follow up in 12 months with duplex ultrasound and physical exam  - VAS US CAROTID; Future  2. Essential hypertension Continue antihypertensive medications as already ordered, these medications have been reviewed and there are no changes at this time.   3. Hyperlipidemia, unspecified hyperlipidemia type Continue statin as ordered and reviewed, no changes at this time   4. Status post total bilateral knee replacement Continue NSAID medications as already ordered, these medications have been reviewed and there are no changes at this time.  Continued activity and therapy was stressed.     Levora Dredge, MD  09/12/2020 10:17 AM

## 2020-09-15 DIAGNOSIS — G5602 Carpal tunnel syndrome, left upper limb: Secondary | ICD-10-CM | POA: Diagnosis not present

## 2020-09-16 ENCOUNTER — Telehealth: Payer: Self-pay

## 2020-09-16 MED ORDER — FLUOXETINE HCL 40 MG PO CAPS
40.0000 mg | ORAL_CAPSULE | Freq: Every day | ORAL | 1 refills | Status: DC
Start: 2020-09-16 — End: 2020-12-22

## 2020-09-16 NOTE — Telephone Encounter (Signed)
Pt said [facility] told her they haven't heard from [place] regarding her Prozac prescription she requested last week. She said Dr. Nicki Reaper has filled this for her before. She said she only has 3 left.

## 2020-09-26 ENCOUNTER — Other Ambulatory Visit: Payer: Self-pay

## 2020-09-26 ENCOUNTER — Other Ambulatory Visit (INDEPENDENT_AMBULATORY_CARE_PROVIDER_SITE_OTHER): Payer: Medicare Other

## 2020-09-26 DIAGNOSIS — I10 Essential (primary) hypertension: Secondary | ICD-10-CM

## 2020-09-26 DIAGNOSIS — R739 Hyperglycemia, unspecified: Secondary | ICD-10-CM | POA: Diagnosis not present

## 2020-09-26 DIAGNOSIS — Z789 Other specified health status: Secondary | ICD-10-CM

## 2020-09-26 DIAGNOSIS — E785 Hyperlipidemia, unspecified: Secondary | ICD-10-CM

## 2020-09-26 LAB — LIPID PANEL
Cholesterol: 165 mg/dL (ref 0–200)
HDL: 69.8 mg/dL (ref >39.00)
LDL Cholesterol: 81 mg/dL (ref 0–99)
NonHDL: 95.27
Total CHOL/HDL Ratio: 2
Triglycerides: 73 mg/dL (ref 0.0–149.0)
VLDL: 14.6 mg/dL (ref 0.0–40.0)

## 2020-09-26 LAB — BASIC METABOLIC PANEL
BUN: 24 mg/dL — ABNORMAL HIGH (ref 6–23)
CO2: 29 mEq/L (ref 19–32)
Calcium: 9.7 mg/dL (ref 8.4–10.5)
Chloride: 102 mEq/L (ref 96–112)
Creatinine, Ser: 1.2 mg/dL (ref 0.40–1.20)
GFR: 43.01 mL/min — ABNORMAL LOW (ref >60.00)
Glucose, Bld: 107 mg/dL — ABNORMAL HIGH (ref 70–99)
Potassium: 4.3 mEq/L (ref 3.5–5.1)
Sodium: 138 mEq/L (ref 135–145)

## 2020-09-26 LAB — HEPATIC FUNCTION PANEL
ALT: 14 U/L (ref 0–35)
AST: 16 U/L (ref 0–37)
Albumin: 4.5 g/dL (ref 3.5–5.2)
Alkaline Phosphatase: 69 U/L (ref 39–117)
Bilirubin, Direct: 0.1 mg/dL (ref 0.0–0.3)
Total Bilirubin: 0.7 mg/dL (ref 0.2–1.2)
Total Protein: 7.3 g/dL (ref 6.0–8.3)

## 2020-09-26 LAB — HEMOGLOBIN A1C: Hgb A1c MFr Bld: 5.8 % (ref 4.6–6.5)

## 2020-09-29 ENCOUNTER — Encounter: Payer: Self-pay | Admitting: Internal Medicine

## 2020-09-29 ENCOUNTER — Ambulatory Visit (INDEPENDENT_AMBULATORY_CARE_PROVIDER_SITE_OTHER): Payer: Medicare Other | Admitting: Internal Medicine

## 2020-09-29 ENCOUNTER — Other Ambulatory Visit: Payer: Self-pay

## 2020-09-29 DIAGNOSIS — M65339 Trigger finger, unspecified middle finger: Secondary | ICD-10-CM | POA: Diagnosis not present

## 2020-09-29 DIAGNOSIS — G4730 Sleep apnea, unspecified: Secondary | ICD-10-CM | POA: Diagnosis not present

## 2020-09-29 DIAGNOSIS — F32A Depression, unspecified: Secondary | ICD-10-CM

## 2020-09-29 DIAGNOSIS — N1832 Chronic kidney disease, stage 3b: Secondary | ICD-10-CM | POA: Diagnosis not present

## 2020-09-29 DIAGNOSIS — I6523 Occlusion and stenosis of bilateral carotid arteries: Secondary | ICD-10-CM

## 2020-09-29 DIAGNOSIS — Z96653 Presence of artificial knee joint, bilateral: Secondary | ICD-10-CM

## 2020-09-29 DIAGNOSIS — I10 Essential (primary) hypertension: Secondary | ICD-10-CM | POA: Diagnosis not present

## 2020-09-29 DIAGNOSIS — E785 Hyperlipidemia, unspecified: Secondary | ICD-10-CM

## 2020-09-29 DIAGNOSIS — F320 Major depressive disorder, single episode, mild: Secondary | ICD-10-CM

## 2020-09-29 DIAGNOSIS — R739 Hyperglycemia, unspecified: Secondary | ICD-10-CM

## 2020-09-29 MED ORDER — HYDRALAZINE HCL 10 MG PO TABS: 10.0000 mg | ORAL_TABLET | Freq: Three times a day (TID) | ORAL | 2 refills | Status: DC

## 2020-09-29 NOTE — Progress Notes (Signed)
Patient ID: Amber Bridges, female   DOB: 1941/05/17, 79 y.o.   MRN: [id number]   Subjective:    Patient ID: Amber Bridges, female    DOB: July 10, 1941, 79 y.o.   MRN: [id number]  HPI This visit occurred during the SARS-CoV-2 public health emergency.  Safety protocols were in place, including screening questions prior to the visit, additional usage of staff PPE, and extensive cleaning of exam room while observing appropriate contact time as indicated for disinfecting solutions.  Patient here for a scheduled follow up.  Here to follow up regarding her blood pressure, blood sugar and cholesterol.  She reports she is doing relatively well.  Tries to stay active.  Has had right hip pain.  Using ice and heat. Later day - aggravates.  Does limit her activity some.  No chest pain or sob reported.  No abdominal pain or bowel change reported.  Has started weight watchers.  Saw neurology recently.  S/p injection for trigger finger.  Doing better.  Saw Dr Gilda Crease 09/12/20 - RICA 40-59% and LICA <30%.  Recommended f/u in 12 months.  Fell 10/12 21 and 08/11/20.  Has seen ortho.  Knee is better.  Discussed labs.    Past Medical History:  Diagnosis Date  . Arthritis   . Bronchitis   . Coronary artery disease   . Depression   . HOH (hard of hearing)    AIDS  . Hypertension   . Sleep apnea    CPAP   Past Surgical History:  Procedure Laterality Date  . ABDOMINAL HYSTERECTOMY    . APPENDECTOMY    . BACK SURGERY     X 3  . CATARACT EXTRACTION W/ INTRAOCULAR LENS IMPLANT Right 2017  . CATARACT EXTRACTION W/PHACO Left 08/08/2016   Procedure: CATARACT EXTRACTION PHACO AND INTRAOCULAR LENS PLACEMENT (IOC);  Surgeon: Sallee Lange, MD;  Location: [facility];  Service: Ophthalmology;  Laterality: Left;  fluid lot # [id number]  exp02/28/2019 [place]    01:15.3 AP%   25.2 CDE   35.06   . CTR Right   . FOOT SURGERY    . JOINT REPLACEMENT     TKR  . KNEE ARTHROPLASTY Left 11/24/2018   Procedure: COMPUTER  ASSISTED TOTAL KNEE ARTHROPLASTY-LEFT;  Surgeon: Donato Heinz, MD;  Location: [facility];  Service: Orthopedics;  Laterality: Left;  . NOSE SURGERY    . RCR Left   . SHOULDER ARTHROSCOPY Right    for bursitis  . SPINE SURGERY  1973, 1978, 2016   Most recent Laminectomy to T11 and L3   Family History  Problem Relation Age of Onset  . Congestive Heart Failure Mother   . Diabetes Father   . Congestive Heart Failure Father   . Congestive Heart Failure Sister   . Congestive Heart Failure Brother    Social History   Socioeconomic History  . Marital status: Married    Spouse name: Not on file  . Number of children: Not on file  . Years of education: Not on file  . Highest education level: Not on file  Occupational History  . Not on file  Tobacco Use  . Smoking status: Former Smoker    Quit date: 10/08/1960    Years since quitting: 60.0  . Smokeless tobacco: Never Used  Vaping Use  . Vaping Use: Never used  Substance and Sexual Activity  . Alcohol use: Yes    Comment: 2 cocktails  . Drug use: Never  . Sexual activity: Not on file  Patient ID: Amber Bridges, female   DOB: Mar 20, 1941, 79 y.o.   MRN: [id number]   Subjective:    Patient ID: Amber Bridges, female    DOB: [birth year], 79 y.o.   MRN: [id number]  HPI This visit occurred during the SARS-CoV-2 public health emergency.  Safety protocols were in place, including screening questions prior to the visit, additional usage of staff PPE, and extensive cleaning of exam room while observing appropriate contact time as indicated for disinfecting solutions.  Patient here for a scheduled follow up.  Here to follow up regarding her blood pressure, blood sugar and cholesterol.  She reports she is doing relatively well.  Tries to stay active.  Has had right hip pain.  Using ice and heat. Later day - aggravates.  Does limit her activity some.  No chest pain or sob reported.  No abdominal pain or bowel change reported.  Has started weight watchers.  Saw neurology recently.  S/p injection for trigger finger.  Doing better.  Saw Dr Delana Meyer 09/12/20 - RICA 65-68% and LICA <12%.  Recommended f/u in 12 months.  Fell 10/12 21 and 08/11/20.  Has seen ortho.  Knee is better.  Discussed labs.    Past Medical History:  Diagnosis Date  . Arthritis   . Bronchitis   . Coronary artery disease   . Depression   . HOH (hard of hearing)    AIDS  . Hypertension   . Sleep apnea    CPAP   Past Surgical History:  Procedure Laterality Date  . ABDOMINAL HYSTERECTOMY    . APPENDECTOMY    . BACK SURGERY     X 3  . CATARACT EXTRACTION W/ INTRAOCULAR LENS IMPLANT Right 2017  . CATARACT EXTRACTION W/PHACO Left 08/08/2016   Procedure: CATARACT EXTRACTION PHACO AND INTRAOCULAR LENS PLACEMENT (IOC);  Surgeon: Estill Cotta, MD;  Location: [facility];  Service: Ophthalmology;  Laterality: Left;  fluid lot # [id number]  exp02/28/2019 [place]    01:15.3 AP%   25.2 CDE   35.06   . CTR Right   . FOOT SURGERY    . JOINT REPLACEMENT     TKR  . KNEE ARTHROPLASTY Left 11/24/2018   Procedure: COMPUTER  ASSISTED TOTAL KNEE ARTHROPLASTY-LEFT;  Surgeon: Dereck Leep, MD;  Location: [facility];  Service: Orthopedics;  Laterality: Left;  . NOSE SURGERY    . RCR Left   . SHOULDER ARTHROSCOPY Right    for bursitis  . [place]  1973, 1978, 2016   Most recent Laminectomy to T11 and L3   Family History  Problem Relation Age of Onset  . Congestive Heart Failure Mother   . Diabetes Father   . Congestive Heart Failure Father   . Congestive Heart Failure Sister   . Congestive Heart Failure Brother    Social History   Socioeconomic History  . Marital status: Married    Spouse name: Not on file  . Number of children: Not on file  . Years of education: Not on file  . Highest education level: Not on file  Occupational History  . Not on file  Tobacco Use  . Smoking status: Former Smoker    Quit date: 10/08/1960    Years since quitting: 60.0  . Smokeless tobacco: Never Used  Vaping Use  . Vaping Use: Never used  Substance and Sexual Activity  . Alcohol use: Yes    Comment: 2 cocktails  . Drug use: Never  . Sexual activity: Not on file  Tenderness: There is no abdominal tenderness.  Musculoskeletal:        General: No swelling, tenderness or edema.     Cervical back: Neck supple. No tenderness.  Lymphadenopathy:     Cervical: No cervical adenopathy.  Skin:    Findings: No erythema or rash.  Neurological:     Mental Status: She is alert.  Psychiatric:        Mood and Affect: Mood normal.        Behavior: Behavior normal.     BP (!) 148/68   Pulse 69   Temp 97.7 F (36.5 C) (Oral)   Resp 16   Ht 5\' 7"  (1.702 m)   Wt 245 lb 12.8 oz (111.5 kg)   SpO2 98%   BMI 38.50 kg/m  Wt Readings from Last 3 Encounters:  09/29/20 245 lb 12.8 oz (111.5 kg)  09/12/20 246 lb (111.6 kg)  08/01/20 250 lb 6.4 oz (113.6 kg)     Lab Results  Component Value Date   WBC 4.6 02/29/2020   HGB 13.0 02/29/2020   HCT 38.8 02/29/2020   PLT 194.0 02/29/2020   GLUCOSE 107 (H) 09/26/2020   CHOL 165 09/26/2020   TRIG 73.0 09/26/2020   HDL 69.80 09/26/2020   LDLCALC 81 09/26/2020   ALT 14 09/26/2020   AST 16 09/26/2020   NA 138 09/26/2020   K 4.3 09/26/2020   CL 102 09/26/2020   CREATININE 1.20 09/26/2020   BUN 24 (H) 09/26/2020   CO2 29 09/26/2020   TSH 2.90 02/29/2020   INR 0.96 11/12/2018   HGBA1C 5.8 09/26/2020       Assessment & Plan:   Problem List Items Addressed This Visit    Hyperlipidemia    On lipitor.  Low cholesterol diet and exercise.  Follow lipid panel and liver function tests.        Relevant Medications   hydrALAZINE (APRESOLINE) 10 MG tablet   Essential hypertension    Blood pressure remains elevated.  On losartan, hctz and amlodipine.   Will add hydralazine 10mg  tid. Follow pressures.  Get her back in soon to reassess.        Relevant Medications   hydrALAZINE (APRESOLINE) 10 MG tablet   Bilateral carotid artery stenosis    Just saw Dr Gilda Crease 09/12/20 as outlined.  Recommended f/u in 12 months.        Relevant Medications   hydrALAZINE (APRESOLINE) 10 MG tablet   Sleep apnea    Continue cpap.       Total knee replacement status    Followed by Dr Ernest Pine.       Mild depression (HCC)    Overall stable.  On prozac.  Follow.       Hyperglycemia    Low carb diet and exercise.  Follow met b and a1c.       Trigger finger    Saw neurology.  S/p injection.  Follow.  Doing better.       CKD (chronic kidney disease) stage 3, GFR 30-59 ml/min (HCC)    Avoid antiinflammatories.  Follow metabolic panel.            Dale Bristow, MD

## 2020-09-30 LAB — SARS-COV-2 SEMI-QUANTITATIVE TOTAL ANTIBODY, SPIKE: SARS COV2 AB, Total Spike Semi QN: 227.3 U/mL — ABNORMAL HIGH (ref <0.8)

## 2020-10-06 ENCOUNTER — Encounter: Payer: Self-pay | Admitting: Internal Medicine

## 2020-10-06 NOTE — Assessment & Plan Note (Signed)
Followed by Dr Hooten.  

## 2020-10-06 NOTE — Assessment & Plan Note (Signed)
On lipitor.  Low cholesterol diet and exercise.  Follow lipid panel and liver function tests.   

## 2020-10-06 NOTE — Assessment & Plan Note (Signed)
Low carb diet and exercise.  Follow met b and a1c.  

## 2020-10-06 NOTE — Assessment & Plan Note (Signed)
Saw neurology.  S/p injection.  Follow.  Doing better.

## 2020-10-06 NOTE — Assessment & Plan Note (Signed)
Continue cpap.  

## 2020-10-06 NOTE — Assessment & Plan Note (Signed)
Avoid antiinflammatories.  Follow metabolic panel.  

## 2020-10-06 NOTE — Assessment & Plan Note (Signed)
Overall stable.  On prozac.  Follow.

## 2020-10-06 NOTE — Assessment & Plan Note (Signed)
Blood pressure remains elevated.  On losartan, hctz and amlodipine.  Will add hydralazine 10mg  tid. Follow pressures.  Get her back in soon to reassess.

## 2020-10-06 NOTE — Assessment & Plan Note (Signed)
Just saw Dr Gilda Crease 09/12/20 as outlined.  Recommended f/u in 12 months.

## 2020-11-02 ENCOUNTER — Other Ambulatory Visit: Payer: Self-pay | Admitting: Internal Medicine

## 2020-11-04 ENCOUNTER — Other Ambulatory Visit: Payer: Self-pay | Admitting: *Deleted

## 2020-11-04 MED ORDER — HYDROCHLOROTHIAZIDE 25 MG PO TABS: 25.0000 mg | ORAL_TABLET | Freq: Every evening | ORAL | 1 refills | Status: DC

## 2020-11-04 MED ORDER — ATORVASTATIN CALCIUM 40 MG PO TABS
40.0000 mg | ORAL_TABLET | Freq: Every day | ORAL | 1 refills | Status: DC
Start: 2020-11-04 — End: 2021-04-26

## 2020-11-04 NOTE — Telephone Encounter (Signed)
Received a fax from [facility] stating that pt is requesting a new Rx for Atorvastatin & HCTZ. Both are listing as "historical meds" in chart.

## 2020-11-04 NOTE — Telephone Encounter (Signed)
rx ok'd for lipitor and hctz.

## 2020-12-08 ENCOUNTER — Other Ambulatory Visit: Payer: Self-pay

## 2020-12-08 ENCOUNTER — Ambulatory Visit (INDEPENDENT_AMBULATORY_CARE_PROVIDER_SITE_OTHER): Payer: Medicare Other | Admitting: Internal Medicine

## 2020-12-08 ENCOUNTER — Encounter: Payer: Self-pay | Admitting: Internal Medicine

## 2020-12-08 DIAGNOSIS — E785 Hyperlipidemia, unspecified: Secondary | ICD-10-CM

## 2020-12-08 DIAGNOSIS — Z96653 Presence of artificial knee joint, bilateral: Secondary | ICD-10-CM | POA: Diagnosis not present

## 2020-12-08 DIAGNOSIS — N1832 Chronic kidney disease, stage 3b: Secondary | ICD-10-CM

## 2020-12-08 DIAGNOSIS — F320 Major depressive disorder, single episode, mild: Secondary | ICD-10-CM | POA: Diagnosis not present

## 2020-12-08 DIAGNOSIS — I6523 Occlusion and stenosis of bilateral carotid arteries: Secondary | ICD-10-CM | POA: Diagnosis not present

## 2020-12-08 DIAGNOSIS — R739 Hyperglycemia, unspecified: Secondary | ICD-10-CM | POA: Diagnosis not present

## 2020-12-08 DIAGNOSIS — G4730 Sleep apnea, unspecified: Secondary | ICD-10-CM

## 2020-12-08 DIAGNOSIS — I10 Essential (primary) hypertension: Secondary | ICD-10-CM | POA: Diagnosis not present

## 2020-12-08 DIAGNOSIS — F32A Depression, unspecified: Secondary | ICD-10-CM

## 2020-12-08 MED ORDER — HYDROCHLOROTHIAZIDE 25 MG PO TABS: 25.0000 mg | ORAL_TABLET | Freq: Every evening | ORAL | 1 refills | Status: DC

## 2020-12-08 MED ORDER — HYDRALAZINE HCL 25 MG PO TABS: 25.0000 mg | ORAL_TABLET | Freq: Three times a day (TID) | ORAL | 2 refills | Status: DC

## 2020-12-08 NOTE — Progress Notes (Signed)
Patient ID: Amber Bridges, female   DOB: 1941-05-02, 80 y.o.   MRN: [id number]   Subjective:    Patient ID: Amber Bridges, female    DOB: 01-Apr-1941, 80 y.o.   MRN: [id number]  HPI This visit occurred during the SARS-CoV-2 public health emergency.  Safety protocols were in place, including screening questions prior to the visit, additional usage of staff PPE, and extensive cleaning of exam room while observing appropriate contact time as indicated for disinfecting solutions.  Patient here for a scheduled follow up.   She reports she is doing relatively well.  Here to follow up regarding her blood pressure, blood sugar and cholesterol.  Joined weight watchers.  Discussed diet and exercise. Discussed weight loss clinic.  No chest pain or sob reported.  No abdominal pain or bowel change reported.  States blood pressures averaging 140s/60s.   Some swelling of left foot. No pain.  Discussed leg elevation.  Compression hose.  Knee better.  Saw Dr Gilda Crease 12/6 21 - recommended f/u in 12 months.   Past Medical History:  Diagnosis Date  . Arthritis   . Bronchitis   . Coronary artery disease   . Depression   . HOH (hard of hearing)    AIDS  . Hypertension   . Sleep apnea    CPAP   Past Surgical History:  Procedure Laterality Date  . ABDOMINAL HYSTERECTOMY    . APPENDECTOMY    . BACK SURGERY     X 3  . CATARACT EXTRACTION W/ INTRAOCULAR LENS IMPLANT Right 2017  . CATARACT EXTRACTION W/PHACO Left 08/08/2016   Procedure: CATARACT EXTRACTION PHACO AND INTRAOCULAR LENS PLACEMENT (IOC);  Surgeon: Sallee Lange, MD;  Location: [facility];  Service: Ophthalmology;  Laterality: Left;  fluid lot # [id number]  exp02/28/2019 [place]    01:15.3 AP%   25.2 CDE   35.06   . CTR Right   . FOOT SURGERY    . JOINT REPLACEMENT     TKR  . KNEE ARTHROPLASTY Left 11/24/2018   Procedure: COMPUTER ASSISTED TOTAL KNEE ARTHROPLASTY-LEFT;  Surgeon: Donato Heinz, MD;  Location: [facility];  Service:  Orthopedics;  Laterality: Left;  . NOSE SURGERY    . RCR Left   . SHOULDER ARTHROSCOPY Right    for bursitis  . SPINE SURGERY  1973, 1978, 2016   Most recent Laminectomy to T11 and L3   Family History  Problem Relation Age of Onset  . Congestive Heart Failure Mother   . Diabetes Father   . Congestive Heart Failure Father   . Congestive Heart Failure Sister   . Congestive Heart Failure Brother    Social History   Socioeconomic History  . Marital status: Married    Spouse name: Not on file  . Number of children: Not on file  . Years of education: Not on file  . Highest education level: Not on file  Occupational History  . Not on file  Tobacco Use  . Smoking status: Former Smoker    Quit date: 10/08/1960    Years since quitting: 60.2  . Smokeless tobacco: Never Used  Vaping Use  . Vaping Use: Never used  Substance and Sexual Activity  . Alcohol use: Yes    Comment: 2 cocktails  . Drug use: Never  . Sexual activity: Not on file  Other Topics Concern  . Not on file  Social History Narrative  . Not on file   Social Determinants of Health   Financial Resource Strain:  Not on file  Food Insecurity: Not on file  Transportation Needs: Not on file  Physical Activity: Not on file  Stress: Not on file  Social Connections: Not on file    Outpatient Encounter Medications as of 12/08/2020  Medication Sig  . hydrALAZINE (APRESOLINE) 25 MG tablet Take 1 tablet (25 mg total) by mouth 3 (three) times daily.  Marland Kitchen acetaminophen (TYLENOL) 500 MG tablet Take 500 mg by mouth every 6 (six) hours as needed.  Marland Kitchen amLODipine (NORVASC) 5 MG tablet Take one tablet bid  . aspirin 81 MG EC tablet Take 81 mg by mouth daily.  Marland Kitchen atorvastatin (LIPITOR) 40 MG tablet Take 1 tablet (40 mg total) by mouth at bedtime.  . Coenzyme Q10 (COQ10) 200 MG CAPS Take 1 capsule by mouth daily.  . Doxylamine Succinate, Sleep, (SLEEP AID PO) Take 1 tablet by mouth at bedtime as needed (sleep).  Marland Kitchen FLUoxetine (PROZAC)  40 MG capsule Take 1 capsule (40 mg total) by mouth daily.  . hydrochlorothiazide (HYDRODIURIL) 25 MG tablet Take 1 tablet (25 mg total) by mouth every evening.  Marland Kitchen losartan (COZAAR) 100 MG tablet TAKE 1 TABLET(100 MG) BY MOUTH DAILY  . Multiple Vitamins-Minerals (CENTRUM SILVER PO) Take 1 tablet by mouth daily.  Marland Kitchen nystatin cream (MYCOSTATIN) Apply 1 application topically 2 (two) times daily.  . Polyethylene Glycol 3350 (MIRALAX PO) Take 17 g by mouth daily as needed (constipation).  Marland Kitchen senna (SENOKOT) 8.6 MG tablet Take 1 tablet by mouth daily.  . traZODone (DESYREL) 50 MG tablet 1/2 tablet q hs prn  . [DISCONTINUED] hydrALAZINE (APRESOLINE) 10 MG tablet Take 1 tablet (10 mg total) by mouth 3 (three) times daily.  . [DISCONTINUED] hydrochlorothiazide (HYDRODIURIL) 25 MG tablet Take 1 tablet (25 mg total) by mouth every evening.   No facility-administered encounter medications on file as of 12/08/2020.    Review of Systems  Constitutional: Negative for appetite change and unexpected weight change.  HENT: Negative for congestion and sinus pressure.   Respiratory: Negative for cough, chest tightness and shortness of breath.   Cardiovascular: Negative for chest pain and palpitations.       No increased swelling.   Gastrointestinal: Negative for abdominal pain, diarrhea, nausea and vomiting.  Genitourinary: Negative for difficulty urinating and dysuria.  Musculoskeletal: Negative for joint swelling and myalgias.  Skin: Negative for color change and rash.  Neurological: Negative for dizziness, light-headedness and headaches.  Psychiatric/Behavioral: Negative for agitation and dysphoric mood.       Objective:    Physical Exam Vitals reviewed.  Constitutional:      General: She is not in acute distress.    Appearance: Normal appearance.  HENT:     Head: Normocephalic and atraumatic.     Right Ear: External ear normal.     Left Ear: External ear normal.  Eyes:     General: No scleral  icterus.       Left eye: No discharge.     Conjunctiva/sclera: Conjunctivae normal.  Neck:     Thyroid: No thyromegaly.  Cardiovascular:     Rate and Rhythm: Normal rate and regular rhythm.  Pulmonary:     Effort: No respiratory distress.     Breath sounds: Normal breath sounds. No wheezing.  Abdominal:     General: Bowel sounds are normal.     Palpations: Abdomen is soft.     Tenderness: There is no abdominal tenderness.  Musculoskeletal:        General: No tenderness.  Cervical back: Neck supple. No tenderness.     Comments: No increased swelling.    Lymphadenopathy:     Cervical: No cervical adenopathy.  Skin:    Findings: No erythema or rash.  Neurological:     Mental Status: She is alert.  Psychiatric:        Mood and Affect: Mood normal.        Behavior: Behavior normal.     BP 138/60   Pulse 79   Temp 98.1 F (36.7 C)   Ht 5\' 7"  (1.702 m)   Wt 246 lb 8 oz (111.8 kg)   SpO2 98%   BMI 38.61 kg/m  Wt Readings from Last 3 Encounters:  12/08/20 246 lb 8 oz (111.8 kg)  09/29/20 245 lb 12.8 oz (111.5 kg)  09/12/20 246 lb (111.6 kg)     Lab Results  Component Value Date   WBC 4.6 02/29/2020   HGB 13.0 02/29/2020   HCT 38.8 02/29/2020   PLT 194.0 02/29/2020   GLUCOSE 107 (H) 09/26/2020   CHOL 165 09/26/2020   TRIG 73.0 09/26/2020   HDL 69.80 09/26/2020   LDLCALC 81 09/26/2020   ALT 14 09/26/2020   AST 16 09/26/2020   NA 138 09/26/2020   K 4.3 09/26/2020   CL 102 09/26/2020   CREATININE 1.20 09/26/2020   BUN 24 (H) 09/26/2020   CO2 29 09/26/2020   TSH 2.90 02/29/2020   INR 0.96 11/12/2018   HGBA1C 5.8 09/26/2020       Assessment & Plan:   Problem List Items Addressed This Visit    Bilateral carotid artery stenosis    Saw Dr Gilda Crease - appt 09/12/20 - RICA 40-59% and LICA <30%.  Stable.  Recommended f/u in 12 months.  Continue lipitor.       Relevant Medications   hydrALAZINE (APRESOLINE) 25 MG tablet   hydrochlorothiazide (HYDRODIURIL) 25  MG tablet   CKD (chronic kidney disease) stage 3, GFR 30-59 ml/min (HCC)    Avoid antiinflammatories.  Continue losartan.  Follow metabolic panel.       Essential hypertension    Blood pressure remains elevated above goal.  On losartan, hctz, amlodipine and recently added hydralazine.  Will increase hydralazine to 25mg  tid. Follow pressures.  Get her back in soon to reassess.        Relevant Medications   hydrALAZINE (APRESOLINE) 25 MG tablet   hydrochlorothiazide (HYDRODIURIL) 25 MG tablet   Other Relevant Orders   CBC with Differential/Platelet   TSH   Basic metabolic panel   Hyperglycemia    Low carb diet and exercise.  Follow met b and a1c.  Dr Darrick Huntsman diet given.       Relevant Orders   Hemoglobin A1c   Hyperlipidemia    Continue lipitor. Low cholesterol diet and exercise.  Follow lipid panel and liver function tests.        Relevant Medications   hydrALAZINE (APRESOLINE) 25 MG tablet   hydrochlorothiazide (HYDRODIURIL) 25 MG tablet   Other Relevant Orders   Hepatic function panel   Lipid panel   Mild depression (HCC)    Appears to be stable.  Feeling better.  Continue prozac.       Sleep apnea    CPAP.       Total knee replacement status    Followed by Dr Ernest Pine.           Dale Avalon, MD

## 2020-12-18 ENCOUNTER — Encounter: Payer: Self-pay | Admitting: Internal Medicine

## 2020-12-18 NOTE — Assessment & Plan Note (Addendum)
CPAP.  

## 2020-12-18 NOTE — Assessment & Plan Note (Signed)
Continue lipitor.  Low cholesterol diet and exercise.  Follow lipid panel and liver function tests.   

## 2020-12-18 NOTE — Assessment & Plan Note (Signed)
Appears to be stable.  Feeling better.  Continue prozac.

## 2020-12-18 NOTE — Assessment & Plan Note (Signed)
Blood pressure remains elevated above goal.  On losartan, hctz, amlodipine and recently added hydralazine.  Will increase hydralazine to 25mg  tid. Follow pressures.  Get her back in soon to reassess.

## 2020-12-18 NOTE — Assessment & Plan Note (Signed)
Followed by Dr Marry Guan.

## 2020-12-18 NOTE — Assessment & Plan Note (Signed)
Saw Dr Delana Meyer - appt 09/12/20 - RICA 11-94% and LICA <17%.  Stable.  Recommended f/u in 12 months.  Continue lipitor.

## 2020-12-18 NOTE — Assessment & Plan Note (Signed)
Low carb diet and exercise.  Follow met b and a1c.  Dr Derrel Nip diet given.

## 2020-12-18 NOTE — Assessment & Plan Note (Signed)
Avoid antiinflammatories.  Continue losartan.  Follow metabolic panel.  

## 2020-12-22 ENCOUNTER — Telehealth: Payer: Self-pay | Admitting: Internal Medicine

## 2020-12-22 MED ORDER — FLUOXETINE HCL 40 MG PO CAPS: 40.0000 mg | ORAL_CAPSULE | Freq: Every day | ORAL | 1 refills | Status: DC

## 2020-12-22 NOTE — Telephone Encounter (Signed)
Patient needs the following refill, FLUoxetine (PROZAC) 40 MG capsule. Pharmacy is [facility] in [place], please send there.

## 2021-01-03 DIAGNOSIS — G4733 Obstructive sleep apnea (adult) (pediatric): Secondary | ICD-10-CM | POA: Diagnosis not present

## 2021-01-04 ENCOUNTER — Other Ambulatory Visit
Admission: RE | Admit: 2021-01-04 | Discharge: 2021-01-04 | Disposition: A | Discharge status: Home / Self Care | Payer: Medicare Other | Source: Ambulatory Visit | Attending: Family Medicine | Admitting: Family Medicine

## 2021-01-04 ENCOUNTER — Encounter: Payer: Self-pay | Admitting: Internal Medicine

## 2021-01-04 DIAGNOSIS — I517 Cardiomegaly: Secondary | ICD-10-CM | POA: Diagnosis not present

## 2021-01-04 DIAGNOSIS — R5383 Other fatigue: Secondary | ICD-10-CM | POA: Insufficient documentation

## 2021-01-04 DIAGNOSIS — R209 Unspecified disturbances of skin sensation: Secondary | ICD-10-CM | POA: Diagnosis not present

## 2021-01-04 DIAGNOSIS — R0600 Dyspnea, unspecified: Secondary | ICD-10-CM | POA: Insufficient documentation

## 2021-01-04 LAB — BRAIN NATRIURETIC PEPTIDE: B Natriuretic Peptide: 40.8 pg/mL (ref 0.0–100.0)

## 2021-01-04 LAB — D-DIMER, QUANTITATIVE: D-Dimer, Quant: 0.87 ug/mL-FEU — ABNORMAL HIGH (ref 0.00–0.50)

## 2021-01-04 NOTE — Telephone Encounter (Signed)
She was previously on hydralazine 10mg  tid and we increased to 25mg  tid.  She was doing fine on the 10mg .  I agree with need for evaluation.  Not sure that just increasing the dose explains the symptoms.  With the sob, left arm pain - needs to be evaluated.

## 2021-01-04 NOTE — Telephone Encounter (Signed)
Spoken to patient, she stated since she started the new medication, she started feeling very tired, sleeping more, SOB upon exherstion outside walking to mailbox and back while inside she is not SOB. She stated she wheezes when she gets SOB outside. Patient also stated when she first wakes up her heart is beating real fast, unable to check it. Patient is feeling very chilly and her feet and hands feel cold even under a blanket. Left arm pain comes and goes.NO irregular heart beats, blurry vision, chest px, chest px, urine frequency, fever, cough , congestion and abnormal BM. Patient agreed to [facility]. She stated she will comply.

## 2021-02-02 DIAGNOSIS — Z872 Personal history of diseases of the skin and subcutaneous tissue: Secondary | ICD-10-CM | POA: Diagnosis not present

## 2021-02-02 DIAGNOSIS — L218 Other seborrheic dermatitis: Secondary | ICD-10-CM | POA: Diagnosis not present

## 2021-02-02 DIAGNOSIS — L821 Other seborrheic keratosis: Secondary | ICD-10-CM | POA: Diagnosis not present

## 2021-02-02 DIAGNOSIS — L578 Other skin changes due to chronic exposure to nonionizing radiation: Secondary | ICD-10-CM | POA: Diagnosis not present

## 2021-02-02 DIAGNOSIS — L570 Actinic keratosis: Secondary | ICD-10-CM | POA: Diagnosis not present

## 2021-02-08 ENCOUNTER — Other Ambulatory Visit (INDEPENDENT_AMBULATORY_CARE_PROVIDER_SITE_OTHER): Payer: Medicare Other

## 2021-02-08 ENCOUNTER — Other Ambulatory Visit: Payer: Self-pay

## 2021-02-08 DIAGNOSIS — R739 Hyperglycemia, unspecified: Secondary | ICD-10-CM | POA: Diagnosis not present

## 2021-02-08 DIAGNOSIS — E785 Hyperlipidemia, unspecified: Secondary | ICD-10-CM | POA: Diagnosis not present

## 2021-02-08 DIAGNOSIS — I10 Essential (primary) hypertension: Secondary | ICD-10-CM | POA: Diagnosis not present

## 2021-02-08 LAB — HEPATIC FUNCTION PANEL
ALT: 16 U/L (ref 0–35)
AST: 17 U/L (ref 0–37)
Albumin: 4.4 g/dL (ref 3.5–5.2)
Alkaline Phosphatase: 75 U/L (ref 39–117)
Bilirubin, Direct: 0.1 mg/dL (ref 0.0–0.3)
Total Bilirubin: 0.7 mg/dL (ref 0.2–1.2)
Total Protein: 6.9 g/dL (ref 6.0–8.3)

## 2021-02-08 LAB — LIPID PANEL
Cholesterol: 151 mg/dL (ref 0–200)
HDL: 71.4 mg/dL (ref >39.00)
LDL Cholesterol: 67 mg/dL (ref 0–99)
NonHDL: 79.85
Total CHOL/HDL Ratio: 2
Triglycerides: 66 mg/dL (ref 0.0–149.0)
VLDL: 13.2 mg/dL (ref 0.0–40.0)

## 2021-02-08 LAB — CBC WITH DIFFERENTIAL/PLATELET
Basophils Absolute: 0 10*3/uL (ref 0.0–0.1)
Basophils Relative: 0.9 % (ref 0.0–3.0)
Eosinophils Absolute: 0.4 10*3/uL (ref 0.0–0.7)
Eosinophils Relative: 8.7 % — ABNORMAL HIGH (ref 0.0–5.0)
HCT: 37.3 % (ref 36.0–46.0)
Hemoglobin: 12.4 g/dL (ref 12.0–15.0)
Lymphocytes Relative: 21.3 % (ref 12.0–46.0)
Lymphs Abs: 0.9 10*3/uL (ref 0.7–4.0)
MCHC: 33.2 g/dL (ref 30.0–36.0)
MCV: 92.8 fl (ref 78.0–100.0)
Monocytes Absolute: 0.3 10*3/uL (ref 0.1–1.0)
Monocytes Relative: 7.8 % (ref 3.0–12.0)
Neutro Abs: 2.7 10*3/uL (ref 1.4–7.7)
Neutrophils Relative %: 61.3 % (ref 43.0–77.0)
Platelets: 177 10*3/uL (ref 150.0–400.0)
RBC: 4.02 Mil/uL (ref 3.87–5.11)
RDW: 13.8 % (ref 11.5–15.5)
WBC: 4.4 10*3/uL (ref 4.0–10.5)

## 2021-02-08 LAB — BASIC METABOLIC PANEL
BUN: 26 mg/dL — ABNORMAL HIGH (ref 6–23)
CO2: 28 mEq/L (ref 19–32)
Calcium: 9.4 mg/dL (ref 8.4–10.5)
Chloride: 105 mEq/L (ref 96–112)
Creatinine, Ser: 1.14 mg/dL (ref 0.40–1.20)
GFR: 45.63 mL/min — ABNORMAL LOW (ref >60.00)
Glucose, Bld: 105 mg/dL — ABNORMAL HIGH (ref 70–99)
Potassium: 3.9 mEq/L (ref 3.5–5.1)
Sodium: 141 mEq/L (ref 135–145)

## 2021-02-08 LAB — TSH: TSH: 1.73 u[IU]/mL (ref 0.35–4.50)

## 2021-02-08 LAB — HEMOGLOBIN A1C: Hgb A1c MFr Bld: 5.9 % (ref 4.6–6.5)

## 2021-02-10 ENCOUNTER — Encounter: Payer: Self-pay | Admitting: Internal Medicine

## 2021-02-10 ENCOUNTER — Other Ambulatory Visit: Payer: Self-pay

## 2021-02-10 ENCOUNTER — Ambulatory Visit (INDEPENDENT_AMBULATORY_CARE_PROVIDER_SITE_OTHER): Payer: Medicare Other | Admitting: Internal Medicine

## 2021-02-10 DIAGNOSIS — M25473 Effusion, unspecified ankle: Secondary | ICD-10-CM

## 2021-02-10 DIAGNOSIS — R739 Hyperglycemia, unspecified: Secondary | ICD-10-CM | POA: Diagnosis not present

## 2021-02-10 DIAGNOSIS — N1832 Chronic kidney disease, stage 3b: Secondary | ICD-10-CM | POA: Diagnosis not present

## 2021-02-10 DIAGNOSIS — G4730 Sleep apnea, unspecified: Secondary | ICD-10-CM | POA: Diagnosis not present

## 2021-02-10 DIAGNOSIS — I6523 Occlusion and stenosis of bilateral carotid arteries: Secondary | ICD-10-CM | POA: Diagnosis not present

## 2021-02-10 DIAGNOSIS — F32A Depression, unspecified: Secondary | ICD-10-CM

## 2021-02-10 DIAGNOSIS — I10 Essential (primary) hypertension: Secondary | ICD-10-CM | POA: Diagnosis not present

## 2021-02-10 DIAGNOSIS — E785 Hyperlipidemia, unspecified: Secondary | ICD-10-CM | POA: Diagnosis not present

## 2021-02-10 DIAGNOSIS — F320 Major depressive disorder, single episode, mild: Secondary | ICD-10-CM

## 2021-02-11 ENCOUNTER — Encounter: Payer: Self-pay | Admitting: Internal Medicine

## 2021-02-11 DIAGNOSIS — M25473 Effusion, unspecified ankle: Secondary | ICD-10-CM | POA: Insufficient documentation

## 2021-02-11 NOTE — Assessment & Plan Note (Signed)
Continue lipitor.  Low cholesterol diet and exercise.  Follow lipid panel and liver function tests.   

## 2021-02-11 NOTE — Assessment & Plan Note (Signed)
Low carb diet and exercise.  Follow met b and a1c.  

## 2021-02-11 NOTE — Assessment & Plan Note (Signed)
Continue prozac.  Stable.  Follow.

## 2021-02-11 NOTE — Assessment & Plan Note (Signed)
Better in am.  Worse as day progresses.  Breathing stable.  Leg elevation.  Compression hose.  Follow.

## 2021-02-11 NOTE — Assessment & Plan Note (Signed)
Continue cpap.  

## 2021-02-11 NOTE — Assessment & Plan Note (Signed)
Avoid antiinflammatories.  Continue losartan.  Follow metabolic panel.  

## 2021-02-11 NOTE — Assessment & Plan Note (Signed)
Continue losartan, hctz, amlodipine and recently added hydralazine (25mg  tid).  Follow pressures.  Follow metabolic panel.

## 2021-02-11 NOTE — Assessment & Plan Note (Signed)
Saw Dr Delana Meyer - appt 09/2020 - RICA 123456 and LICA [id number].  Stable.  Recommended f/u in 12 months.  Continue lipitor.

## 2021-04-05 ENCOUNTER — Encounter: Payer: Self-pay | Admitting: Internal Medicine

## 2021-04-18 ENCOUNTER — Other Ambulatory Visit: Payer: Self-pay | Admitting: Internal Medicine

## 2021-04-26 ENCOUNTER — Other Ambulatory Visit: Payer: Self-pay | Admitting: Internal Medicine

## 2021-05-01 DIAGNOSIS — G5603 Carpal tunnel syndrome, bilateral upper limbs: Secondary | ICD-10-CM | POA: Diagnosis not present

## 2021-05-04 DIAGNOSIS — L218 Other seborrheic dermatitis: Secondary | ICD-10-CM | POA: Diagnosis not present

## 2021-05-04 DIAGNOSIS — L570 Actinic keratosis: Secondary | ICD-10-CM | POA: Diagnosis not present

## 2021-05-04 DIAGNOSIS — L2089 Other atopic dermatitis: Secondary | ICD-10-CM | POA: Diagnosis not present

## 2021-05-17 ENCOUNTER — Other Ambulatory Visit: Payer: Self-pay | Admitting: Internal Medicine

## 2021-05-18 ENCOUNTER — Other Ambulatory Visit: Payer: Medicare Other

## 2021-05-19 ENCOUNTER — Ambulatory Visit: Payer: Medicare Other | Admitting: Internal Medicine

## 2021-06-14 ENCOUNTER — Other Ambulatory Visit: Payer: Self-pay

## 2021-06-14 ENCOUNTER — Other Ambulatory Visit (INDEPENDENT_AMBULATORY_CARE_PROVIDER_SITE_OTHER): Payer: Medicare Other

## 2021-06-14 DIAGNOSIS — E785 Hyperlipidemia, unspecified: Secondary | ICD-10-CM

## 2021-06-14 DIAGNOSIS — I10 Essential (primary) hypertension: Secondary | ICD-10-CM | POA: Diagnosis not present

## 2021-06-14 DIAGNOSIS — R739 Hyperglycemia, unspecified: Secondary | ICD-10-CM

## 2021-06-14 LAB — BASIC METABOLIC PANEL
BUN: 23 mg/dL (ref 6–23)
CO2: 26 mEq/L (ref 19–32)
Calcium: 9.5 mg/dL (ref 8.4–10.5)
Chloride: 102 mEq/L (ref 96–112)
Creatinine, Ser: 1.15 mg/dL (ref 0.40–1.20)
GFR: 45.04 mL/min — ABNORMAL LOW (ref >60.00)
Glucose, Bld: 107 mg/dL — ABNORMAL HIGH (ref 70–99)
Potassium: 4.3 mEq/L (ref 3.5–5.1)
Sodium: 138 mEq/L (ref 135–145)

## 2021-06-14 LAB — HEPATIC FUNCTION PANEL
ALT: 18 U/L (ref 0–35)
AST: 18 U/L (ref 0–37)
Albumin: 4.3 g/dL (ref 3.5–5.2)
Alkaline Phosphatase: 69 U/L (ref 39–117)
Bilirubin, Direct: 0.2 mg/dL (ref 0.0–0.3)
Total Bilirubin: 0.8 mg/dL (ref 0.2–1.2)
Total Protein: 7 g/dL (ref 6.0–8.3)

## 2021-06-14 LAB — LIPID PANEL
Cholesterol: 153 mg/dL (ref 0–200)
HDL: 75.7 mg/dL (ref >39.00)
LDL Cholesterol: 66 mg/dL (ref 0–99)
NonHDL: 77.24
Total CHOL/HDL Ratio: 2
Triglycerides: 56 mg/dL (ref 0.0–149.0)
VLDL: 11.2 mg/dL (ref 0.0–40.0)

## 2021-06-14 LAB — HEMOGLOBIN A1C: Hgb A1c MFr Bld: 6 % (ref 4.6–6.5)

## 2021-06-15 ENCOUNTER — Ambulatory Visit (INDEPENDENT_AMBULATORY_CARE_PROVIDER_SITE_OTHER): Payer: Medicare Other | Admitting: Internal Medicine

## 2021-06-15 ENCOUNTER — Encounter: Payer: Self-pay | Admitting: Internal Medicine

## 2021-06-15 ENCOUNTER — Other Ambulatory Visit: Payer: Self-pay

## 2021-06-15 VITALS — BP 158/72 | HR 61 | Temp 98.1°F | Ht 67.01 in | Wt 244.4 lb

## 2021-06-15 DIAGNOSIS — R739 Hyperglycemia, unspecified: Secondary | ICD-10-CM | POA: Diagnosis not present

## 2021-06-15 DIAGNOSIS — F320 Major depressive disorder, single episode, mild: Secondary | ICD-10-CM

## 2021-06-15 DIAGNOSIS — F32A Depression, unspecified: Secondary | ICD-10-CM

## 2021-06-15 DIAGNOSIS — N1832 Chronic kidney disease, stage 3b: Secondary | ICD-10-CM

## 2021-06-15 DIAGNOSIS — E785 Hyperlipidemia, unspecified: Secondary | ICD-10-CM | POA: Diagnosis not present

## 2021-06-15 DIAGNOSIS — I6523 Occlusion and stenosis of bilateral carotid arteries: Secondary | ICD-10-CM

## 2021-06-15 DIAGNOSIS — G4730 Sleep apnea, unspecified: Secondary | ICD-10-CM | POA: Diagnosis not present

## 2021-06-15 DIAGNOSIS — Z23 Encounter for immunization: Secondary | ICD-10-CM | POA: Diagnosis not present

## 2021-06-15 DIAGNOSIS — I10 Essential (primary) hypertension: Secondary | ICD-10-CM | POA: Diagnosis not present

## 2021-06-15 MED ORDER — HYDRALAZINE HCL 50 MG PO TABS: 50.0000 mg | ORAL_TABLET | Freq: Two times a day (BID) | ORAL | 1 refills | Status: DC

## 2021-06-15 MED ORDER — AMLODIPINE BESYLATE 5 MG PO TABS: 5.0000 mg | ORAL_TABLET | Freq: Two times a day (BID) | ORAL | 1 refills | Status: DC

## 2021-06-15 NOTE — Patient Instructions (Signed)
Change hydralazine to '50mg'$  twice a day

## 2021-06-15 NOTE — Progress Notes (Signed)
Patient ID: Amber Bridges, female   DOB: June 07, 1941, 80 y.o.   MRN: [id number]   Subjective:    Patient ID: Amber Bridges, female    DOB: [birth year], 80 y.o.   MRN: [id number]  This visit occurred during the SARS-CoV-2 public health emergency.  Safety protocols were in place, including screening questions prior to the visit, additional usage of staff PPE, and extensive cleaning of exam room while observing appropriate contact time as indicated for disinfecting solutions.   Patient here for a scheduled follow up.   Chief Complaint  Patient presents with   Follow-up    Pt needs new Rx sent in for amlodipine. Since provider changed dose pt is unable to fill at pharmacy due to insurance and medication being prescribed as taking differently   .   HPI Follow up regarding hypertension.  Mix up with her medication. Needs refill amlodipine. Tries to stay active.  No chest pain or sob with increased activity or exertion.  No acid reflux.  No abdominal pain.  Bowels moving.  Blood pressure elevated here.  She is having trouble remembering hydralazine tid.  Will usually get two doses in per day.  Taking losartan as directed.  Had some questions about diagnosis insurance had listed.     Past Medical History:  Diagnosis Date   Arthritis    Bronchitis    Coronary artery disease    Depression    HOH (hard of hearing)    AIDS   Hypertension    Sleep apnea    CPAP   Past Surgical History:  Procedure Laterality Date   ABDOMINAL HYSTERECTOMY     APPENDECTOMY     BACK SURGERY     X 3   CATARACT EXTRACTION W/ INTRAOCULAR LENS IMPLANT Right 2017   CATARACT EXTRACTION W/PHACO Left 08/08/2016   Procedure: CATARACT EXTRACTION PHACO AND INTRAOCULAR LENS PLACEMENT (IOC);  Surgeon: Sallee Lange, MD;  Location: [facility];  Service: Ophthalmology;  Laterality: Left;  fluid lot # [id number]  exp02/28/2019 [place]    01:15.3 AP%   25.2 CDE   35.06    CTR Right    FOOT SURGERY     JOINT  REPLACEMENT     TKR   KNEE ARTHROPLASTY Left 11/24/2018   Procedure: COMPUTER ASSISTED TOTAL KNEE ARTHROPLASTY-LEFT;  Surgeon: Donato Heinz, MD;  Location: [facility];  Service: Orthopedics;  Laterality: Left;   NOSE SURGERY     RCR Left    SHOULDER ARTHROSCOPY Right    for bursitis   SPINE SURGERY  1973, 1978, 2016   Most recent Laminectomy to T11 and L3   Family History  Problem Relation Age of Onset   Congestive Heart Failure Mother    Diabetes Father    Congestive Heart Failure Father    Congestive Heart Failure Sister    Congestive Heart Failure Brother    Social History   Socioeconomic History   Marital status: Married    Spouse name: Not on file   Number of children: Not on file   Years of education: Not on file   Highest education level: Not on file  Occupational History   Not on file  Tobacco Use   Smoking status: Former    Types: Cigarettes    Quit date: 10/08/1960    Years since quitting: 60.7   Smokeless tobacco: Never  Vaping Use   Vaping Use: Never used  Substance and Sexual Activity   Alcohol use: Yes    Comment: 2  cocktails   Drug use: Never   Sexual activity: Not on file  Other Topics Concern   Not on file  Social History Narrative   Not on file   Social Determinants of Health   Financial Resource Strain: Not on file  Food Insecurity: Not on file  Transportation Needs: Not on file  Physical Activity: Not on file  Stress: Not on file  Social Connections: Not on file    Review of Systems  Constitutional:  Negative for appetite change and unexpected weight change.  HENT:  Negative for congestion and sinus pressure.   Respiratory:  Negative for cough, chest tightness and shortness of breath.   Cardiovascular:  Negative for chest pain, palpitations and leg swelling.  Gastrointestinal:  Negative for abdominal pain, diarrhea, nausea and vomiting.  Genitourinary:  Negative for difficulty urinating and dysuria.  Musculoskeletal:  Negative for  joint swelling and myalgias.  Skin:  Negative for color change and rash.  Neurological:  Negative for dizziness, light-headedness and headaches.  Psychiatric/Behavioral:  Negative for agitation and dysphoric mood.       Objective:     BP (!) 158/72   Pulse 61   Temp 98.1 F (36.7 C)   Ht 5' 7.01" (1.702 m)   Wt 244 lb 6.4 oz (110.9 kg)   SpO2 98%   BMI 38.27 kg/m  Wt Readings from Last 3 Encounters:  06/15/21 244 lb 6.4 oz (110.9 kg)  02/10/21 243 lb 3.2 oz (110.3 kg)  12/08/20 246 lb 8 oz (111.8 kg)    Physical Exam Vitals reviewed.  Constitutional:      General: She is not in acute distress.    Appearance: Normal appearance.  HENT:     Head: Normocephalic and atraumatic.     Right Ear: External ear normal.     Left Ear: External ear normal.  Eyes:     General: No scleral icterus.       Right eye: No discharge.        Left eye: No discharge.     Conjunctiva/sclera: Conjunctivae normal.  Neck:     Thyroid: No thyromegaly.  Cardiovascular:     Rate and Rhythm: Normal rate and regular rhythm.  Pulmonary:     Effort: No respiratory distress.     Breath sounds: Normal breath sounds. No wheezing.  Abdominal:     General: Bowel sounds are normal.     Palpations: Abdomen is soft.     Tenderness: There is no abdominal tenderness.  Musculoskeletal:        General: No swelling or tenderness.     Cervical back: Neck supple. No tenderness.  Lymphadenopathy:     Cervical: No cervical adenopathy.  Skin:    Findings: No erythema or rash.  Neurological:     Mental Status: She is alert.  Psychiatric:        Mood and Affect: Mood normal.        Behavior: Behavior normal.     Outpatient Encounter Medications as of 06/15/2021  Medication Sig   acetaminophen (TYLENOL) 500 MG tablet Take 500 mg by mouth every 6 (six) hours as needed.   aspirin 81 MG EC tablet Take 81 mg by mouth daily.   atorvastatin (LIPITOR) 40 MG tablet TAKE 1 TABLET(40 MG) BY MOUTH AT BEDTIME    Coenzyme Q10 (COQ10) 200 MG CAPS Take 1 capsule by mouth daily.   Doxylamine Succinate, Sleep, (SLEEP AID PO) Take 1 tablet by mouth at bedtime as needed (sleep).  FLUoxetine (PROZAC) 40 MG capsule TAKE 1 CAPSULE(40 MG) BY MOUTH DAILY   hydrALAZINE (APRESOLINE) 50 MG tablet Take 1 tablet (50 mg total) by mouth 2 (two) times daily.   hydrochlorothiazide (HYDRODIURIL) 25 MG tablet TAKE 1 TABLET(25 MG) BY MOUTH EVERY EVENING   ketoconazole (NIZORAL) 2 % cream Apply topically.   losartan (COZAAR) 100 MG tablet TAKE 1 TABLET(100 MG) BY MOUTH DAILY   Multiple Vitamins-Minerals (CENTRUM SILVER PO) Take 1 tablet by mouth daily.   nystatin cream (MYCOSTATIN) Apply 1 application topically 2 (two) times daily.   Polyethylene Glycol 3350 (MIRALAX PO) Take 17 g by mouth daily as needed (constipation).   senna (SENOKOT) 8.6 MG tablet Take 1 tablet by mouth daily.   traZODone (DESYREL) 50 MG tablet 1/2 tablet q hs prn   [DISCONTINUED] amLODipine (NORVASC) 5 MG tablet TAKE 1 TABLET(5 MG) BY MOUTH DAILY   [DISCONTINUED] hydrALAZINE (APRESOLINE) 25 MG tablet TAKE 1 TABLET(25 MG) BY MOUTH THREE TIMES DAILY   amLODipine (NORVASC) 5 MG tablet Take 1 tablet (5 mg total) by mouth 2 (two) times daily.   No facility-administered encounter medications on file as of 06/15/2021.     Lab Results  Component Value Date   WBC 4.4 02/08/2021   HGB 12.4 02/08/2021   HCT 37.3 02/08/2021   PLT 177.0 02/08/2021   GLUCOSE 107 (H) 06/14/2021   CHOL 153 06/14/2021   TRIG 56.0 06/14/2021   HDL 75.70 06/14/2021   LDLCALC 66 06/14/2021   ALT 18 06/14/2021   AST 18 06/14/2021   NA 138 06/14/2021   K 4.3 06/14/2021   CL 102 06/14/2021   CREATININE 1.15 06/14/2021   BUN 23 06/14/2021   CO2 26 06/14/2021   TSH 1.73 02/08/2021   INR 0.96 11/12/2018   HGBA1C 6.0 06/14/2021       Assessment & Plan:   Problem List Items Addressed This Visit     Bilateral carotid artery stenosis    Saw Dr Gilda Crease - appt 09/2020 - RICA  40-59% and LICA <30%.  Stable.  Recommended f/u in 12 months.  Continue lipitor.       Relevant Medications   amLODipine (NORVASC) 5 MG tablet   hydrALAZINE (APRESOLINE) 50 MG tablet   CKD (chronic kidney disease) stage 3, GFR 30-59 ml/min (HCC)    Avoid antiinflammatories.  Continue losartan.  Follow metabolic panel.       Essential hypertension    Continue losartan, hctz, amlodipine and recently added hydralazine (25mg  tid).  Blood pressure elevated.  Send in correct rx for amlodipine 5mg  bid.  Will change hydralazine to 50mg  bid.  (hard to remember third dose).  Follow pressures.  Follow metabolic panel.       Relevant Medications   amLODipine (NORVASC) 5 MG tablet   hydrALAZINE (APRESOLINE) 50 MG tablet   Hyperglycemia    Low carb diet and exercise.  Follow met b and a1c.       Hyperlipidemia    Continue lipitor.  Low cholesterol diet and exercise.  Follow lipid panel and liver function tests.       Relevant Medications   amLODipine (NORVASC) 5 MG tablet   hydrALAZINE (APRESOLINE) 50 MG tablet   Mild depression (HCC)    Continue prozac.  Stable.  Follow.       Sleep apnea    CPAP.       Other Visit Diagnoses     Need for influenza vaccination    -  Primary   Relevant  Orders   Flu Vaccine MDCK QUAD PF (Completed)        Dale [place], MD

## 2021-06-19 ENCOUNTER — Encounter: Payer: Self-pay | Admitting: Internal Medicine

## 2021-06-19 NOTE — Assessment & Plan Note (Signed)
Saw Dr Delana Meyer - appt 09/2020 - RICA 123456 and LICA [id number].  Stable.  Recommended f/u in 12 months.  Continue lipitor.

## 2021-06-19 NOTE — Assessment & Plan Note (Signed)
Continue losartan, hctz, amlodipine and recently added hydralazine ('25mg'$  tid).  Blood pressure elevated.  Send in correct rx for amlodipine '5mg'$  bid.  Will change hydralazine to '50mg'$  bid.  (hard to remember third dose).  Follow pressures.  Follow metabolic panel.

## 2021-06-19 NOTE — Assessment & Plan Note (Signed)
Low carb diet and exercise.  Follow met b and a1c.  

## 2021-06-19 NOTE — Assessment & Plan Note (Signed)
Avoid antiinflammatories.  Continue losartan.  Follow metabolic panel.  

## 2021-06-19 NOTE — Assessment & Plan Note (Signed)
Continue prozac.  Stable.  Follow.

## 2021-06-19 NOTE — Assessment & Plan Note (Signed)
Continue lipitor.  Low cholesterol diet and exercise.  Follow lipid panel and liver function tests.   

## 2021-06-19 NOTE — Assessment & Plan Note (Signed)
CPAP.  

## 2021-07-23 DIAGNOSIS — S39012A Strain of muscle, fascia and tendon of lower back, initial encounter: Secondary | ICD-10-CM | POA: Insufficient documentation

## 2021-07-23 DIAGNOSIS — M47896 Other spondylosis, lumbar region: Secondary | ICD-10-CM | POA: Diagnosis not present

## 2021-07-23 DIAGNOSIS — M545 Low back pain, unspecified: Secondary | ICD-10-CM | POA: Diagnosis not present

## 2021-08-08 DIAGNOSIS — M545 Low back pain, unspecified: Secondary | ICD-10-CM | POA: Diagnosis not present

## 2021-08-15 DIAGNOSIS — M545 Low back pain, unspecified: Secondary | ICD-10-CM | POA: Diagnosis not present

## 2021-08-18 DIAGNOSIS — M545 Low back pain, unspecified: Secondary | ICD-10-CM | POA: Diagnosis not present

## 2021-09-06 DIAGNOSIS — M5416 Radiculopathy, lumbar region: Secondary | ICD-10-CM | POA: Diagnosis not present

## 2021-09-07 ENCOUNTER — Other Ambulatory Visit: Payer: Self-pay | Admitting: Internal Medicine

## 2021-09-08 ENCOUNTER — Ambulatory Visit: Payer: Medicare Other | Admitting: Internal Medicine

## 2021-09-10 NOTE — Progress Notes (Signed)
MRN : [id number]  Amber Bridges is a 80 y.o. (Oct 30, 1940) female who presents with chief complaint of check carotid arteries.  History of Present Illness:   The patient is seen for follow up evaluation of carotid stenosis. The carotid stenosis followed by ultrasound.    The patient denies amaurosis fugax. There is no recent history of TIA symptoms or focal motor deficits. There is no prior documented CVA.   The patient is taking enteric-coated aspirin 81 mg daily.   There is no history of migraine headaches. There is no history of seizures.   The patient has a history of coronary artery disease, no recent episodes of angina or shortness of breath. The patient denies PAD or claudication symptoms. There is a history of hyperlipidemia which is being treated with a statin.     Carotid Duplex done today shows RICA 1-39% and LICA 1-39%.  No significant change compared to last study in 03/12/2019.  No outpatient medications have been marked as taking for the 09/11/21 encounter (Appointment) with Gilda Crease, Latina Craver, MD.    Past Medical History:  Diagnosis Date   Arthritis    Bronchitis    Coronary artery disease    Depression    HOH (hard of hearing)    AIDS   Hypertension    Sleep apnea    CPAP    Past Surgical History:  Procedure Laterality Date   ABDOMINAL HYSTERECTOMY     APPENDECTOMY     BACK SURGERY     X 3   CATARACT EXTRACTION W/ INTRAOCULAR LENS IMPLANT Right 2017   CATARACT EXTRACTION W/PHACO Left 08/08/2016   Procedure: CATARACT EXTRACTION PHACO AND INTRAOCULAR LENS PLACEMENT (IOC);  Surgeon: Sallee Lange, MD;  Location: [facility];  Service: Ophthalmology;  Laterality: Left;  fluid lot # [id number]  exp02/28/2019 [place]    01:15.3 AP%   25.2 CDE   35.06    CTR Right    FOOT SURGERY     JOINT REPLACEMENT     TKR   KNEE ARTHROPLASTY Left 11/24/2018   Procedure: COMPUTER ASSISTED TOTAL KNEE ARTHROPLASTY-LEFT;  Surgeon: Donato Heinz, MD;  Location: [facility];   Service: Orthopedics;  Laterality: Left;   NOSE SURGERY     RCR Left    SHOULDER ARTHROSCOPY Right    for bursitis   SPINE SURGERY  1973, 1978, 2016   Most recent Laminectomy to T11 and L3    Social History Social History   Tobacco Use   Smoking status: Former    Types: Cigarettes    Quit date: 10/08/1960    Years since quitting: 60.9   Smokeless tobacco: Never  Vaping Use   Vaping Use: Never used  Substance Use Topics   Alcohol use: Yes    Comment: 2 cocktails   Drug use: Never    Family History Family History  Problem Relation Age of Onset   Congestive Heart Failure Mother    Diabetes Father    Congestive Heart Failure Father    Congestive Heart Failure Sister    Congestive Heart Failure Brother     Allergies  Allergen Reactions   Influenza Virus Vaccine Other (See Comments) and Swelling    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 Can take flu vaccine that does not relate to eggs Site of flu vaccine. Can take flu vaccine that does not relate to eggs    Penicillins Diarrhea    Has patient had a PCN reaction causing immediate  rash, facial/tongue/throat swelling, SOB or lightheadedness with hypotension: No Has patient had a PCN reaction causing severe rash involving mucus membranes or skin necrosis: No Has patient had a PCN reaction that required hospitalization No Has patient had a PCN reaction occurring within the last 10 years: No If all of the above answers are "NO", then may proceed with Cephalosporin use.    Egg White [Albumen, Egg] Nausea Only   Lisinopril Cough   Hemophilus B Polysaccharide Vaccine Other (See Comments) and Swelling    Site of flu vaccine. Can take flu vaccine that does not relate to eggs     REVIEW OF SYSTEMS (Negative unless checked)  Constitutional: [] Weight loss  [] Fever  [] Chills Cardiac: [] Chest pain   [] Chest pressure   [] Palpitations   [] Shortness of breath when laying flat   [] Shortness of breath with  exertion. Vascular:  [] Pain in legs with walking   [] Pain in legs at rest  [] History of DVT   [] Phlebitis   [] Swelling in legs   [] Varicose veins   [] Non-healing ulcers Pulmonary:   [] Uses home oxygen   [] Productive cough   [] Hemoptysis   [] Wheeze  [] COPD   [] Asthma Neurologic:  [] Dizziness   [] Seizures   [] History of stroke   [] History of TIA  [] Aphasia   [] Vissual changes   [] Weakness or numbness in arm   [] Weakness or numbness in leg Musculoskeletal:   [] Joint swelling   [] Joint pain   [] Low back pain Hematologic:  [] Easy bruising  [] Easy bleeding   [] Hypercoagulable state   [] Anemic Gastrointestinal:  [] Diarrhea   [] Vomiting  [] Gastroesophageal reflux/heartburn   [] Difficulty swallowing. Genitourinary:  [] Chronic kidney disease   [] Difficult urination  [] Frequent urination   [] Blood in urine Skin:  [] Rashes   [] Ulcers  Psychological:  [] History of anxiety   []  History of major depression.  Physical Examination  There were no vitals filed for this visit. There is no height or weight on file to calculate BMI. Gen: WD/WN, NAD Head: [place]/AT, No temporalis wasting.  Ear/Nose/Throat: Hearing grossly intact, nares w/o erythema or drainage Eyes: PER, EOMI, sclera nonicteric.  Neck: Supple, no masses.  No bruit or JVD.  Pulmonary:  Good air movement, no audible wheezing, no use of accessory muscles.  Cardiac: RRR, normal S1, S2, no Murmurs. Vascular:   No carotid bruits noted Vessel Right Left  Radial Palpable Palpable  Carotid Palpable Palpable  Gastrointestinal: soft, non-distended. No guarding/no peritoneal signs.  Musculoskeletal: M/S 5/5 throughout.  No visible deformity.  Neurologic: CN 2-12 intact. Pain and light touch intact in extremities.  Symmetrical.  Speech is fluent. Motor exam as listed above. Psychiatric: Judgment intact, Mood & affect appropriate for pt's clinical situation. Dermatologic: No rashes or ulcers noted.  No changes consistent with cellulitis.   CBC Lab Results   Component Value Date   WBC 4.4 02/08/2021   HGB 12.4 02/08/2021   HCT 37.3 02/08/2021   MCV 92.8 02/08/2021   PLT 177.0 02/08/2021    BMET    Component Value Date/Time   NA 138 06/14/2021 0809   K 4.3 06/14/2021 0809   CL 102 06/14/2021 0809   CO2 26 06/14/2021 0809   GLUCOSE 107 (H) 06/14/2021 0809   BUN 23 06/14/2021 0809   CREATININE 1.15 06/14/2021 0809   CALCIUM 9.5 06/14/2021 0809   GFRNONAA 41 (L) 11/12/2018 1148   GFRAA 47 (L) 11/12/2018 1148   CrCl cannot be calculated (Patient's most recent lab result is older than the maximum 21 days  allowed.).  COAG Lab Results  Component Value Date   INR 0.96 11/12/2018    Radiology No results found.   Assessment/Plan 1. Bilateral carotid artery stenosis Recommend:  Given the patient's asymptomatic subcritical stenosis no further invasive testing or surgery at this time.  Duplex ultrasound shows 1-39% stenosis bilaterally.  Continue antiplatelet therapy as prescribed Continue management of CAD, HTN and Hyperlipidemia Healthy heart diet,  encouraged exercise at least 4 times per week Follow up in 12 months with duplex ultrasound and physical exam    - VAS US CAROTID; Future  2. Essential hypertension Continue antihypertensive medications as already ordered, these medications have been reviewed and there are no changes at this time.   3. Hyperlipidemia, unspecified hyperlipidemia type Continue statin as ordered and reviewed, no changes at this time   4. Borderline diabetes mellitus Continue medications as already ordered, these medications have been reviewed and there are no changes at this time.  Hgb A1C to be monitored as already arranged by primary service     Levora Dredge, MD  09/10/2021 3:41 PM

## 2021-09-11 ENCOUNTER — Other Ambulatory Visit: Payer: Self-pay

## 2021-09-11 ENCOUNTER — Encounter (INDEPENDENT_AMBULATORY_CARE_PROVIDER_SITE_OTHER): Payer: Self-pay | Admitting: Vascular Surgery

## 2021-09-11 ENCOUNTER — Ambulatory Visit (INDEPENDENT_AMBULATORY_CARE_PROVIDER_SITE_OTHER): Payer: Medicare Other | Admitting: Vascular Surgery

## 2021-09-11 ENCOUNTER — Ambulatory Visit (INDEPENDENT_AMBULATORY_CARE_PROVIDER_SITE_OTHER): Payer: Medicare Other

## 2021-09-11 VITALS — BP 132/70 | HR 63 | Resp 16 | Wt 242.8 lb

## 2021-09-11 DIAGNOSIS — R7303 Prediabetes: Secondary | ICD-10-CM

## 2021-09-11 DIAGNOSIS — E785 Hyperlipidemia, unspecified: Secondary | ICD-10-CM

## 2021-09-11 DIAGNOSIS — I10 Essential (primary) hypertension: Secondary | ICD-10-CM

## 2021-09-11 DIAGNOSIS — I6523 Occlusion and stenosis of bilateral carotid arteries: Secondary | ICD-10-CM | POA: Diagnosis not present

## 2021-10-10 ENCOUNTER — Other Ambulatory Visit: Payer: Self-pay | Admitting: Internal Medicine

## 2021-10-12 ENCOUNTER — Other Ambulatory Visit: Payer: Self-pay

## 2021-10-12 ENCOUNTER — Ambulatory Visit (INDEPENDENT_AMBULATORY_CARE_PROVIDER_SITE_OTHER): Payer: Medicare Other | Admitting: Internal Medicine

## 2021-10-12 DIAGNOSIS — M545 Low back pain, unspecified: Secondary | ICD-10-CM

## 2021-10-12 DIAGNOSIS — N1832 Chronic kidney disease, stage 3b: Secondary | ICD-10-CM | POA: Diagnosis not present

## 2021-10-12 DIAGNOSIS — E785 Hyperlipidemia, unspecified: Secondary | ICD-10-CM | POA: Diagnosis not present

## 2021-10-12 DIAGNOSIS — G4730 Sleep apnea, unspecified: Secondary | ICD-10-CM

## 2021-10-12 DIAGNOSIS — I6523 Occlusion and stenosis of bilateral carotid arteries: Secondary | ICD-10-CM

## 2021-10-12 DIAGNOSIS — F32A Depression, unspecified: Secondary | ICD-10-CM

## 2021-10-12 DIAGNOSIS — I10 Essential (primary) hypertension: Secondary | ICD-10-CM | POA: Diagnosis not present

## 2021-10-12 DIAGNOSIS — R739 Hyperglycemia, unspecified: Secondary | ICD-10-CM

## 2021-10-12 NOTE — Progress Notes (Signed)
Patient ID: Amber Bridges, female   DOB: 10-Oct-1940, 81 y.o.   MRN: [id number]   Subjective:    Patient ID: Amber Bridges, female    DOB: September 07, 1941, 81 y.o.   MRN: [id number]  This visit occurred during the SARS-CoV-2 public health emergency.  Safety protocols were in place, including screening questions prior to the visit, additional usage of staff PPE, and extensive cleaning of exam room while observing appropriate contact time as indicated for disinfecting solutions.   Patient here for a scheduled follow up.   Chief Complaint  Patient presents with   Hypertension   .   HPI Recently evaluated by Dr Gilda Crease (09/11/21) - f/u carotid - 1-39% bilaterally.  Stable.  Recommended f/u in 12 months.  Reports blood pressures - 120s/70s with pulse in 60s.  No chest pain or sob reported.  Has had issues recently with her back. (07/2021).  Has been seeing ortho.  S/p MRI.  Discussed - cortisone injection.  She is trying to stay active.  No nausea or vomiting reported.  No bowel issue reported.    Past Medical History:  Diagnosis Date   Arthritis    Bronchitis    Coronary artery disease    Depression    HOH (hard of hearing)    AIDS   Hypertension    Sleep apnea    CPAP   Past Surgical History:  Procedure Laterality Date   ABDOMINAL HYSTERECTOMY     APPENDECTOMY     BACK SURGERY     X 3   CATARACT EXTRACTION W/ INTRAOCULAR LENS IMPLANT Right 2017   CATARACT EXTRACTION W/PHACO Left 08/08/2016   Procedure: CATARACT EXTRACTION PHACO AND INTRAOCULAR LENS PLACEMENT (IOC);  Surgeon: Sallee Lange, MD;  Location: [facility];  Service: Ophthalmology;  Laterality: Left;  fluid lot # [id number]  exp02/28/2019 [place]    01:15.3 AP%   25.2 CDE   35.06    CTR Right    FOOT SURGERY     JOINT REPLACEMENT     TKR   KNEE ARTHROPLASTY Left 11/24/2018   Procedure: COMPUTER ASSISTED TOTAL KNEE ARTHROPLASTY-LEFT;  Surgeon: Donato Heinz, MD;  Location: [facility];  Service: Orthopedics;   Laterality: Left;   NOSE SURGERY     RCR Left    SHOULDER ARTHROSCOPY Right    for bursitis   SPINE SURGERY  1973, 1978, 2016   Most recent Laminectomy to T11 and L3   Family History  Problem Relation Age of Onset   Congestive Heart Failure Mother    Diabetes Father    Congestive Heart Failure Father    Congestive Heart Failure Sister    Congestive Heart Failure Brother    Social History   Socioeconomic History   Marital status: Married    Spouse name: Not on file   Number of children: Not on file   Years of education: Not on file   Highest education level: Not on file  Occupational History   Not on file  Tobacco Use   Smoking status: Former    Types: Cigarettes    Quit date: 10/08/1960    Years since quitting: 61.0   Smokeless tobacco: Never  Vaping Use   Vaping Use: Never used  Substance and Sexual Activity   Alcohol use: Yes    Comment: 2 cocktails   Drug use: Never   Sexual activity: Not on file  Other Topics Concern   Not on file  Social History Narrative   Not on file  Social Determinants of Health   Financial Resource Strain: Not on file  Food Insecurity: Not on file  Transportation Needs: Not on file  Physical Activity: Not on file  Stress: Not on file  Social Connections: Not on file     Review of Systems  Constitutional:  Negative for appetite change and unexpected weight change.  HENT:  Negative for congestion and sinus pressure.   Respiratory:  Negative for chest tightness and shortness of breath.   Cardiovascular:  Negative for chest pain, palpitations and leg swelling.  Gastrointestinal:  Negative for abdominal pain, diarrhea, nausea and vomiting.  Genitourinary:  Negative for difficulty urinating and dysuria.  Musculoskeletal:  Positive for back pain. Negative for joint swelling and myalgias.  Skin:  Negative for color change and rash.  Neurological:  Negative for dizziness, light-headedness and headaches.  Psychiatric/Behavioral:   Negative for agitation and dysphoric mood.       Objective:     BP 130/68    Pulse 73    Temp (!) 97.3 F (36.3 C)    Resp 16    Wt 247 lb (112 kg)    SpO2 97%    BMI 38.68 kg/m  Wt Readings from Last 3 Encounters:  10/12/21 247 lb (112 kg)  09/11/21 242 lb 12.8 oz (110.1 kg)  06/15/21 244 lb 6.4 oz (110.9 kg)    Physical Exam Vitals reviewed.  Constitutional:      General: She is not in acute distress.    Appearance: Normal appearance.  HENT:     Head: Normocephalic and atraumatic.     Right Ear: External ear normal.     Left Ear: External ear normal.  Eyes:     General: No scleral icterus.       Right eye: No discharge.        Left eye: No discharge.     Conjunctiva/sclera: Conjunctivae normal.  Neck:     Thyroid: No thyromegaly.  Cardiovascular:     Rate and Rhythm: Normal rate and regular rhythm.  Pulmonary:     Effort: No respiratory distress.     Breath sounds: Normal breath sounds. No wheezing.  Abdominal:     General: Bowel sounds are normal.     Palpations: Abdomen is soft.     Tenderness: There is no abdominal tenderness.  Musculoskeletal:        General: No swelling or tenderness.     Cervical back: Neck supple. No tenderness.  Lymphadenopathy:     Cervical: No cervical adenopathy.  Skin:    Findings: No erythema or rash.  Neurological:     Mental Status: She is alert.  Psychiatric:        Mood and Affect: Mood normal.        Behavior: Behavior normal.     Outpatient Encounter Medications as of 10/12/2021  Medication Sig   acetaminophen (TYLENOL) 500 MG tablet Take 500 mg by mouth every 6 (six) hours as needed.   amLODipine (NORVASC) 5 MG tablet Take 1 tablet (5 mg total) by mouth 2 (two) times daily.   aspirin 81 MG EC tablet Take 81 mg by mouth daily.   atorvastatin (LIPITOR) 40 MG tablet TAKE 1 TABLET(40 MG) BY MOUTH AT BEDTIME   Coenzyme Q10 (COQ10) 200 MG CAPS Take 1 capsule by mouth daily.   Doxylamine Succinate, Sleep, (SLEEP AID PO) Take  1 tablet by mouth at bedtime as needed (sleep).   FLUoxetine (PROZAC) 40 MG capsule TAKE 1 CAPSULE(40 MG)  BY MOUTH DAILY   hydrALAZINE (APRESOLINE) 50 MG tablet Take 1 tablet (50 mg total) by mouth 2 (two) times daily.   hydrochlorothiazide (HYDRODIURIL) 25 MG tablet TAKE 1 TABLET(25 MG) BY MOUTH EVERY EVENING   ketoconazole (NIZORAL) 2 % cream Apply topically.   losartan (COZAAR) 100 MG tablet TAKE 1 TABLET(100 MG) BY MOUTH DAILY   Multiple Vitamins-Minerals (CENTRUM SILVER PO) Take 1 tablet by mouth daily.   nystatin cream (MYCOSTATIN) Apply 1 application topically 2 (two) times daily.   Polyethylene Glycol 3350 (MIRALAX PO) Take 17 g by mouth daily as needed (constipation).   senna (SENOKOT) 8.6 MG tablet Take 1 tablet by mouth daily.   traZODone (DESYREL) 50 MG tablet 1/2 tablet q hs prn   No facility-administered encounter medications on file as of 10/12/2021.     Lab Results  Component Value Date   WBC 4.4 02/08/2021   HGB 12.4 02/08/2021   HCT 37.3 02/08/2021   PLT 177.0 02/08/2021   GLUCOSE 107 (H) 06/14/2021   CHOL 153 06/14/2021   TRIG 56.0 06/14/2021   HDL 75.70 06/14/2021   LDLCALC 66 06/14/2021   ALT 18 06/14/2021   AST 18 06/14/2021   NA 138 06/14/2021   K 4.3 06/14/2021   CL 102 06/14/2021   CREATININE 1.15 06/14/2021   BUN 23 06/14/2021   CO2 26 06/14/2021   TSH 1.73 02/08/2021   INR 0.96 11/12/2018   HGBA1C 6.0 06/14/2021       Assessment & Plan:   Problem List Items Addressed This Visit     Bilateral carotid artery stenosis    Recently evaluated by Dr Gilda Crease (09/11/21) - f/u carotid - 1-39% bilaterally.  Stable.  Recommended f/u in 12 months.       CKD (chronic kidney disease) stage 3, GFR 30-59 ml/min (HCC)    Avoid antiinflammatories.  Continue losartan.  Follow metabolic panel.       Essential hypertension    Continue losartan, hctz, amlodipine and hydralazine 50mg  bid. Blood pressures as outlined.  Follow pressures.  Follow metabolic panel.        Relevant Orders   Basic metabolic panel   Hyperglycemia    Low carb diet and exercise.  Follow met b and a1c.       Relevant Orders   Hemoglobin A1c   Hyperlipidemia    Continue lipitor.  Low cholesterol diet and exercise.  Follow lipid panel and liver function tests.       Relevant Orders   Hepatic function panel   Lipid panel   Low back pain    Has had issues with her back.  Seeing ortho. Continue f/u with ortho.       Mild depression    Continue prozac.  Stable.  Follow.       Sleep apnea    CPAP.         Dale [place], MD

## 2021-10-16 ENCOUNTER — Encounter: Payer: Self-pay | Admitting: Internal Medicine

## 2021-10-16 DIAGNOSIS — M545 Low back pain, unspecified: Secondary | ICD-10-CM | POA: Insufficient documentation

## 2021-10-16 NOTE — Assessment & Plan Note (Signed)
Continue lipitor.  Low cholesterol diet and exercise.  Follow lipid panel and liver function tests.   

## 2021-10-16 NOTE — Assessment & Plan Note (Signed)
Recently evaluated by Dr Schnier (09/11/21) - f/u carotid - 1-39% bilaterally.  Stable.  Recommended f/u in 12 months.  

## 2021-10-16 NOTE — Assessment & Plan Note (Signed)
Avoid antiinflammatories.  Continue losartan.  Follow metabolic panel.  

## 2021-10-16 NOTE — Assessment & Plan Note (Signed)
Has had issues with her back.  Seeing ortho. Continue f/u with ortho.

## 2021-10-16 NOTE — Assessment & Plan Note (Signed)
CPAP.  

## 2021-10-16 NOTE — Assessment & Plan Note (Signed)
Continue losartan, hctz, amlodipine and hydralazine 50mg  bid. Blood pressures as outlined.  Follow pressures.  Follow metabolic panel.

## 2021-10-16 NOTE — Assessment & Plan Note (Signed)
Continue prozac.  Stable.  Follow.

## 2021-10-16 NOTE — Assessment & Plan Note (Signed)
Low carb diet and exercise.  Follow met b and a1c.

## 2021-10-21 ENCOUNTER — Other Ambulatory Visit: Payer: Self-pay | Admitting: Internal Medicine

## 2021-11-11 ENCOUNTER — Other Ambulatory Visit: Payer: Self-pay | Admitting: Internal Medicine

## 2021-11-30 DIAGNOSIS — G5603 Carpal tunnel syndrome, bilateral upper limbs: Secondary | ICD-10-CM | POA: Diagnosis not present

## 2021-12-19 ENCOUNTER — Ambulatory Visit: Payer: Medicare Other | Admitting: Internal Medicine

## 2021-12-22 DIAGNOSIS — G4733 Obstructive sleep apnea (adult) (pediatric): Secondary | ICD-10-CM | POA: Diagnosis not present

## 2022-01-01 ENCOUNTER — Other Ambulatory Visit: Payer: Self-pay | Admitting: Internal Medicine

## 2022-01-02 ENCOUNTER — Other Ambulatory Visit: Payer: Self-pay | Admitting: Internal Medicine

## 2022-01-22 DIAGNOSIS — L2089 Other atopic dermatitis: Secondary | ICD-10-CM | POA: Diagnosis not present

## 2022-01-22 DIAGNOSIS — Z872 Personal history of diseases of the skin and subcutaneous tissue: Secondary | ICD-10-CM | POA: Diagnosis not present

## 2022-01-22 DIAGNOSIS — L570 Actinic keratosis: Secondary | ICD-10-CM | POA: Diagnosis not present

## 2022-01-22 DIAGNOSIS — C4441 Basal cell carcinoma of skin of scalp and neck: Secondary | ICD-10-CM | POA: Diagnosis not present

## 2022-01-22 DIAGNOSIS — L218 Other seborrheic dermatitis: Secondary | ICD-10-CM | POA: Diagnosis not present

## 2022-01-22 DIAGNOSIS — D485 Neoplasm of uncertain behavior of skin: Secondary | ICD-10-CM | POA: Diagnosis not present

## 2022-01-22 DIAGNOSIS — L821 Other seborrheic keratosis: Secondary | ICD-10-CM | POA: Diagnosis not present

## 2022-01-22 DIAGNOSIS — L578 Other skin changes due to chronic exposure to nonionizing radiation: Secondary | ICD-10-CM | POA: Diagnosis not present

## 2022-01-22 DIAGNOSIS — D0462 Carcinoma in situ of skin of left upper limb, including shoulder: Secondary | ICD-10-CM | POA: Diagnosis not present

## 2022-01-24 ENCOUNTER — Telehealth: Payer: Self-pay | Admitting: Internal Medicine

## 2022-01-24 MED ORDER — ATORVASTATIN CALCIUM 40 MG PO TABS: ORAL_TABLET | ORAL | 2 refills | Status: DC

## 2022-01-24 NOTE — Telephone Encounter (Signed)
Refill sent.

## 2022-01-24 NOTE — Telephone Encounter (Signed)
The patient called requesting a refill on atorvastatin (LIPITOR) 40 MG tablet ?

## 2022-02-05 ENCOUNTER — Other Ambulatory Visit (INDEPENDENT_AMBULATORY_CARE_PROVIDER_SITE_OTHER): Payer: Medicare Other

## 2022-02-05 DIAGNOSIS — E785 Hyperlipidemia, unspecified: Secondary | ICD-10-CM | POA: Diagnosis not present

## 2022-02-05 DIAGNOSIS — R739 Hyperglycemia, unspecified: Secondary | ICD-10-CM | POA: Diagnosis not present

## 2022-02-05 DIAGNOSIS — I10 Essential (primary) hypertension: Secondary | ICD-10-CM

## 2022-02-05 LAB — LIPID PANEL
Cholesterol: 170 mg/dL (ref 0–200)
HDL: 75.4 mg/dL (ref >39.00)
LDL Cholesterol: 75 mg/dL (ref 0–99)
NonHDL: 94.73
Total CHOL/HDL Ratio: 2
Triglycerides: 97 mg/dL (ref 0.0–149.0)
VLDL: 19.4 mg/dL (ref 0.0–40.0)

## 2022-02-05 LAB — BASIC METABOLIC PANEL
BUN: 22 mg/dL (ref 6–23)
CO2: 24 mEq/L (ref 19–32)
Calcium: 9.6 mg/dL (ref 8.4–10.5)
Chloride: 103 mEq/L (ref 96–112)
Creatinine, Ser: 1.33 mg/dL — ABNORMAL HIGH (ref 0.40–1.20)
GFR: 37.66 mL/min — ABNORMAL LOW (ref >60.00)
Glucose, Bld: 119 mg/dL — ABNORMAL HIGH (ref 70–99)
Potassium: 4.1 mEq/L (ref 3.5–5.1)
Sodium: 138 mEq/L (ref 135–145)

## 2022-02-05 LAB — HEPATIC FUNCTION PANEL
ALT: 19 U/L (ref 0–35)
AST: 20 U/L (ref 0–37)
Albumin: 4.4 g/dL (ref 3.5–5.2)
Alkaline Phosphatase: 65 U/L (ref 39–117)
Bilirubin, Direct: 0.1 mg/dL (ref 0.0–0.3)
Total Bilirubin: 0.7 mg/dL (ref 0.2–1.2)
Total Protein: 7 g/dL (ref 6.0–8.3)

## 2022-02-05 LAB — HEMOGLOBIN A1C: Hgb A1c MFr Bld: 5.8 % (ref 4.6–6.5)

## 2022-02-09 ENCOUNTER — Encounter: Payer: Medicare Other | Admitting: Internal Medicine

## 2022-02-15 ENCOUNTER — Ambulatory Visit (INDEPENDENT_AMBULATORY_CARE_PROVIDER_SITE_OTHER): Payer: Medicare Other | Admitting: Internal Medicine

## 2022-02-15 ENCOUNTER — Encounter: Payer: Self-pay | Admitting: Internal Medicine

## 2022-02-15 VITALS — BP 134/62 | HR 69 | Temp 97.0°F | Resp 15 | Ht 67.0 in | Wt 243.0 lb

## 2022-02-15 DIAGNOSIS — G4730 Sleep apnea, unspecified: Secondary | ICD-10-CM | POA: Diagnosis not present

## 2022-02-15 DIAGNOSIS — R0602 Shortness of breath: Secondary | ICD-10-CM | POA: Diagnosis not present

## 2022-02-15 DIAGNOSIS — I779 Disorder of arteries and arterioles, unspecified: Secondary | ICD-10-CM

## 2022-02-15 DIAGNOSIS — N1832 Chronic kidney disease, stage 3b: Secondary | ICD-10-CM

## 2022-02-15 DIAGNOSIS — F32A Depression, unspecified: Secondary | ICD-10-CM

## 2022-02-15 DIAGNOSIS — I10 Essential (primary) hypertension: Secondary | ICD-10-CM | POA: Diagnosis not present

## 2022-02-15 DIAGNOSIS — R739 Hyperglycemia, unspecified: Secondary | ICD-10-CM

## 2022-02-15 DIAGNOSIS — M7989 Other specified soft tissue disorders: Secondary | ICD-10-CM

## 2022-02-15 DIAGNOSIS — Z13820 Encounter for screening for osteoporosis: Secondary | ICD-10-CM

## 2022-02-15 DIAGNOSIS — Z0000 Encounter for general adult medical examination without abnormal findings: Secondary | ICD-10-CM

## 2022-02-15 DIAGNOSIS — E785 Hyperlipidemia, unspecified: Secondary | ICD-10-CM | POA: Diagnosis not present

## 2022-02-15 LAB — BASIC METABOLIC PANEL
BUN: 23 mg/dL (ref 6–23)
CO2: 26 mEq/L (ref 19–32)
Calcium: 9.4 mg/dL (ref 8.4–10.5)
Chloride: 102 mEq/L (ref 96–112)
Creatinine, Ser: 1.21 mg/dL — ABNORMAL HIGH (ref 0.40–1.20)
GFR: 42.17 mL/min — ABNORMAL LOW (ref >60.00)
Glucose, Bld: 90 mg/dL (ref 70–99)
Potassium: 4.3 mEq/L (ref 3.5–5.1)
Sodium: 136 mEq/L (ref 135–145)

## 2022-02-15 NOTE — Progress Notes (Signed)
Patient ID: Amber Bridges, female   DOB: 1941/06/20, 81 y.o.   MRN: [id number] ? ? ?Subjective:  ? ? Patient ID: Amber Bridges, female    DOB: 1941/07/04, 81 y.o.   MRN: [id number] ? ?This visit occurred during the SARS-CoV-2 public health emergency.  Safety protocols were in place, including screening questions prior to the visit, additional usage of staff PPE, and extensive cleaning of exam room while observing appropriate contact time as indicated for disinfecting solutions.  ? ?Patient here for her physical exam.  ? ?Chief Complaint  ?Patient presents with  ? Follow-up  ?  Yearly CPE  ? .  ? ?HPI ?Reports has noticed some increased sob with exertion.  Notices more with inclines.  No cough or congestion.  No acid reflux reported.  No abdominal pain.  Has been recording blood pressures - averaging 130-140/50s.  Some increased fatigue.  Eating.  No nausea or vomiting.  Increased stress. Moved.  Has noticed some increased leg swelling.   ? ? ?Past Medical History:  ?Diagnosis Date  ? Arthritis   ? Bronchitis   ? Coronary artery disease   ? Depression   ? HOH (hard of hearing)   ? AIDS  ? Hypertension   ? Sleep apnea   ? CPAP  ? ?Past Surgical History:  ?Procedure Laterality Date  ? ABDOMINAL HYSTERECTOMY    ? APPENDECTOMY    ? BACK SURGERY    ? X 3  ? CATARACT EXTRACTION W/ INTRAOCULAR LENS IMPLANT Right 2017  ? CATARACT EXTRACTION W/PHACO Left 08/08/2016  ? Procedure: CATARACT EXTRACTION PHACO AND INTRAOCULAR LENS PLACEMENT (IOC);  Surgeon: Sallee Lange, MD;  Location: [facility];  Service: Ophthalmology;  Laterality: Left;  fluid lot # [id number]  exp02/28/2019 ?[place]    01:15.3 ?AP%   25.2 ?CDE   35.06 ?  ? CTR Right   ? FOOT SURGERY    ? JOINT REPLACEMENT    ? TKR  ? KNEE ARTHROPLASTY Left 11/24/2018  ? Procedure: COMPUTER ASSISTED TOTAL KNEE ARTHROPLASTY-LEFT;  Surgeon: Donato Heinz, MD;  Location: [facility];  Service: Orthopedics;  Laterality: Left;  ? NOSE SURGERY    ? RCR Left   ? SHOULDER ARTHROSCOPY  Right   ? for bursitis  ? SPINE SURGERY  1973, 1978, 2016  ? Most recent Laminectomy to T11 and L3  ? ?Family History  ?Problem Relation Age of Onset  ? Congestive Heart Failure Mother   ? Diabetes Father   ? Congestive Heart Failure Father   ? Congestive Heart Failure Sister   ? Congestive Heart Failure Brother   ? ?Social History  ? ?Socioeconomic History  ? Marital status: Married  ?  Spouse name: Not on file  ? Number of children: Not on file  ? Years of education: Not on file  ? Highest education level: Not on file  ?Occupational History  ? Not on file  ?Tobacco Use  ? Smoking status: Former  ?  Types: Cigarettes  ?  Quit date: 10/08/1960  ?  Years since quitting: 61.4  ? Smokeless tobacco: Never  ?Vaping Use  ? Vaping Use: Never used  ?Substance and Sexual Activity  ? Alcohol use: Yes  ?  Comment: 2 cocktails  ? Drug use: Never  ? Sexual activity: Not on file  ?Other Topics Concern  ? Not on file  ?Social History Narrative  ? Not on file  ? ?Social Determinants of Health  ? ?Financial Resource Strain: Not on file  ?  Food Insecurity: Not on file  ?Transportation Needs: Not on file  ?Physical Activity: Not on file  ?Stress: Not on file  ?Social Connections: Not on file  ? ? ? ?Review of Systems  ?Constitutional:  Negative for appetite change and unexpected weight change.  ?HENT:  Negative for congestion, sinus pressure and sore throat.   ?Eyes:  Negative for pain and visual disturbance.  ?Respiratory:  Positive for shortness of breath. Negative for cough.   ?Cardiovascular:  Positive for leg swelling. Negative for chest pain and palpitations.  ?Gastrointestinal:  Negative for abdominal pain, diarrhea, nausea and vomiting.  ?Genitourinary:  Negative for difficulty urinating and dysuria.  ?Musculoskeletal:  Negative for joint swelling and myalgias.  ?Skin:  Negative for color change and rash.  ?Neurological:  Negative for dizziness, light-headedness and headaches.  ?Hematological:  Negative for adenopathy. Does not  bruise/bleed easily.  ?Psychiatric/Behavioral:  Negative for agitation and dysphoric mood.   ? ?   ?Objective:  ?  ? ?BP 134/62 (BP Location: Left Arm, Patient Position: Sitting, Cuff Size: Large)   Pulse 69   Temp (!) 97 ?F (36.1 ?C) (Temporal)   Resp 15   Ht 5\' 7"  (1.702 m)   Wt 243 lb (110.2 kg)   SpO2 99%   BMI 38.06 kg/m?  ?Wt Readings from Last 3 Encounters:  ?02/15/22 243 lb (110.2 kg)  ?10/12/21 247 lb (112 kg)  ?09/11/21 242 lb 12.8 oz (110.1 kg)  ? ? ?Physical Exam ?Vitals reviewed.  ?Constitutional:   ?   General: She is not in acute distress. ?   Appearance: Normal appearance. She is well-developed.  ?HENT:  ?   Head: Normocephalic and atraumatic.  ?   Right Ear: External ear normal.  ?   Left Ear: External ear normal.  ?Eyes:  ?   General: No scleral icterus.    ?   Right eye: No discharge.     ?   Left eye: No discharge.  ?   Conjunctiva/sclera: Conjunctivae normal.  ?Neck:  ?   Thyroid: No thyromegaly.  ?Cardiovascular:  ?   Rate and Rhythm: Normal rate and regular rhythm.  ?Pulmonary:  ?   Effort: No tachypnea, accessory muscle usage or respiratory distress.  ?   Breath sounds: Normal breath sounds. No decreased breath sounds or wheezing.  ?Chest:  ?Breasts: ?   Right: No inverted nipple, mass, nipple discharge or tenderness (no axillary adenopathy).  ?   Left: No inverted nipple, mass, nipple discharge or tenderness (no axilarry adenopathy).  ?Abdominal:  ?   General: Bowel sounds are normal.  ?   Palpations: Abdomen is soft.  ?   Tenderness: There is no abdominal tenderness.  ?Musculoskeletal:     ?   General: No tenderness.  ?   Cervical back: Neck supple.  ?   Comments: Pedal and lower extremity swelling.   ?Lymphadenopathy:  ?   Cervical: No cervical adenopathy.  ?Skin: ?   Findings: No erythema or rash.  ?Neurological:  ?   Mental Status: She is alert and oriented to person, place, and time.  ?Psychiatric:     ?   Mood and Affect: Mood normal.     ?   Behavior: Behavior normal.   ? ? ? ?Outpatient Encounter Medications as of 02/15/2022  ?Medication Sig  ? acetaminophen (TYLENOL) 500 MG tablet Take 500 mg by mouth every 6 (six) hours as needed.  ? amLODipine (NORVASC) 5 MG tablet TAKE 1 TABLET(5 MG) BY MOUTH  TWICE DAILY  ? aspirin 81 MG EC tablet Take 81 mg by mouth daily.  ? atorvastatin (LIPITOR) 40 MG tablet TAKE 1 TABLET(40 MG) BY MOUTH AT BEDTIME  ? Coenzyme Q10 (COQ10) 200 MG CAPS Take 1 capsule by mouth daily.  ? Doxylamine Succinate, Sleep, (SLEEP AID PO) Take 1 tablet by mouth at bedtime as needed (sleep).  ? FLUoxetine (PROZAC) 40 MG capsule TAKE 1 CAPSULE(40 MG) BY MOUTH DAILY  ? hydrALAZINE (APRESOLINE) 50 MG tablet TAKE 1 TABLET(50 MG) BY MOUTH TWICE DAILY  ? hydrochlorothiazide (HYDRODIURIL) 25 MG tablet TAKE 1 TABLET(25 MG) BY MOUTH EVERY EVENING  ? ketoconazole (NIZORAL) 2 % cream Apply topically.  ? losartan (COZAAR) 100 MG tablet TAKE 1 TABLET(100 MG) BY MOUTH DAILY  ? Multiple Vitamins-Minerals (CENTRUM SILVER PO) Take 1 tablet by mouth daily.  ? nystatin cream (MYCOSTATIN) Apply 1 application topically 2 (two) times daily.  ? Polyethylene Glycol 3350 (MIRALAX PO) Take 17 g by mouth daily as needed (constipation).  ? senna (SENOKOT) 8.6 MG tablet Take 1 tablet by mouth daily.  ? traZODone (DESYREL) 50 MG tablet 1/2 tablet q hs prn  ? ?No facility-administered encounter medications on file as of 02/15/2022.  ?  ? ?Lab Results  ?Component Value Date  ? WBC 4.4 02/08/2021  ? HGB 12.4 02/08/2021  ? HCT 37.3 02/08/2021  ? PLT 177.0 02/08/2021  ? GLUCOSE 90 02/15/2022  ? CHOL 170 02/05/2022  ? TRIG 97.0 02/05/2022  ? HDL 75.40 02/05/2022  ? LDLCALC 75 02/05/2022  ? ALT 19 02/05/2022  ? AST 20 02/05/2022  ? NA 136 02/15/2022  ? K 4.3 02/15/2022  ? CL 102 02/15/2022  ? CREATININE 1.21 (H) 02/15/2022  ? BUN 23 02/15/2022  ? CO2 26 02/15/2022  ? TSH 1.73 02/08/2021  ? INR 0.96 11/12/2018  ? HGBA1C 5.8 02/05/2022  ? ? ?   ?Assessment & Plan:  ? ?Problem List Items Addressed This Visit    ? ? Carotid artery disease (HCC)  ?  Recently evaluated by Dr Gilda Crease (09/11/21) - f/u carotid - 1-39% bilaterally.  Stable.  Recommended f/u in 12 months.  ?  ?  ? 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

## 2022-02-15 NOTE — Patient Instructions (Signed)
Appointment with Dr Nehemiah Massed - Monday 02/19/22 at 10:00  ?

## 2022-02-19 ENCOUNTER — Encounter: Payer: Self-pay | Admitting: Internal Medicine

## 2022-02-19 DIAGNOSIS — I208 Other forms of angina pectoris: Secondary | ICD-10-CM | POA: Insufficient documentation

## 2022-02-19 DIAGNOSIS — N1831 Chronic kidney disease, stage 3a: Secondary | ICD-10-CM | POA: Diagnosis not present

## 2022-02-19 DIAGNOSIS — I447 Left bundle-branch block, unspecified: Secondary | ICD-10-CM | POA: Diagnosis not present

## 2022-02-19 DIAGNOSIS — M7989 Other specified soft tissue disorders: Secondary | ICD-10-CM | POA: Insufficient documentation

## 2022-02-19 DIAGNOSIS — Z0000 Encounter for general adult medical examination without abnormal findings: Secondary | ICD-10-CM | POA: Insufficient documentation

## 2022-02-19 DIAGNOSIS — I10 Essential (primary) hypertension: Secondary | ICD-10-CM | POA: Diagnosis not present

## 2022-02-19 DIAGNOSIS — I6523 Occlusion and stenosis of bilateral carotid arteries: Secondary | ICD-10-CM | POA: Diagnosis not present

## 2022-02-19 DIAGNOSIS — R0602 Shortness of breath: Secondary | ICD-10-CM | POA: Diagnosis not present

## 2022-02-19 DIAGNOSIS — E7800 Pure hypercholesterolemia, unspecified: Secondary | ICD-10-CM | POA: Diagnosis not present

## 2022-02-19 NOTE — Assessment & Plan Note (Signed)
Physical today 02/15/22.   ?

## 2022-02-19 NOTE — Assessment & Plan Note (Signed)
Avoid antiinflammatories.  Continue losartan.  Follow metabolic panel.  

## 2022-02-19 NOTE — Assessment & Plan Note (Signed)
Continue prozac.  Increased stress recently.  Moved.  Follow.  ?

## 2022-02-19 NOTE — Assessment & Plan Note (Signed)
Recently evaluated by Dr Schnier (09/11/21) - f/u carotid - 1-39% bilaterally.  Stable.  Recommended f/u in 12 months.  

## 2022-02-19 NOTE — Assessment & Plan Note (Signed)
CPAP.  

## 2022-02-19 NOTE — Assessment & Plan Note (Signed)
Blood pressure as outlined.  134/68 checked by me.  Continue losartan, hctz, amlodipine and hydralazine.  Follow pressures.  Follow metabolic panel.  ?

## 2022-02-19 NOTE — Assessment & Plan Note (Signed)
Leg elevation.  Compression hose.  Follow.  Hold on changing medication.  Obtain cardiac w/up as outlined.  ?

## 2022-02-19 NOTE — Assessment & Plan Note (Signed)
Continue lipitor.  Low cholesterol diet and exercise.  Follow lipid panel and liver function tests.   

## 2022-02-19 NOTE — Assessment & Plan Note (Signed)
Has noticed increased sob with exertion.  Especially walking up inclines.  EKG today reveal SR with a new LBBB.  Given symptoms and EKG changes, needs further cardiac w/up.  Request [facility] cardiology.  appt made with Dr Nehemiah Massed - Monday 02/19/22 at 10:00.  Discussed if any change or worsening symptoms, she is to be evaluated.   ?

## 2022-02-19 NOTE — Assessment & Plan Note (Signed)
Low carb diet and exercise.  Follow met b and a1c.  ?

## 2022-03-19 ENCOUNTER — Encounter: Payer: Self-pay | Admitting: Internal Medicine

## 2022-03-19 DIAGNOSIS — I447 Left bundle-branch block, unspecified: Secondary | ICD-10-CM | POA: Diagnosis not present

## 2022-03-19 DIAGNOSIS — R0602 Shortness of breath: Secondary | ICD-10-CM | POA: Diagnosis not present

## 2022-03-19 DIAGNOSIS — I208 Other forms of angina pectoris: Secondary | ICD-10-CM | POA: Diagnosis not present

## 2022-03-20 DIAGNOSIS — C44629 Squamous cell carcinoma of skin of left upper limb, including shoulder: Secondary | ICD-10-CM | POA: Diagnosis not present

## 2022-03-20 DIAGNOSIS — D0462 Carcinoma in situ of skin of left upper limb, including shoulder: Secondary | ICD-10-CM | POA: Diagnosis not present

## 2022-03-26 DIAGNOSIS — Z961 Presence of intraocular lens: Secondary | ICD-10-CM | POA: Diagnosis not present

## 2022-04-03 DIAGNOSIS — C4491 Basal cell carcinoma of skin, unspecified: Secondary | ICD-10-CM | POA: Diagnosis not present

## 2022-04-03 DIAGNOSIS — L988 Other specified disorders of the skin and subcutaneous tissue: Secondary | ICD-10-CM | POA: Diagnosis not present

## 2022-04-05 ENCOUNTER — Ambulatory Visit: Payer: Medicare Other | Admitting: Internal Medicine

## 2022-04-05 DIAGNOSIS — I6523 Occlusion and stenosis of bilateral carotid arteries: Secondary | ICD-10-CM | POA: Diagnosis not present

## 2022-04-05 DIAGNOSIS — N1831 Chronic kidney disease, stage 3a: Secondary | ICD-10-CM | POA: Diagnosis not present

## 2022-04-05 DIAGNOSIS — E7800 Pure hypercholesterolemia, unspecified: Secondary | ICD-10-CM | POA: Diagnosis not present

## 2022-04-05 DIAGNOSIS — I10 Essential (primary) hypertension: Secondary | ICD-10-CM | POA: Diagnosis not present

## 2022-04-05 DIAGNOSIS — I340 Nonrheumatic mitral (valve) insufficiency: Secondary | ICD-10-CM | POA: Insufficient documentation

## 2022-04-05 DIAGNOSIS — I447 Left bundle-branch block, unspecified: Secondary | ICD-10-CM | POA: Diagnosis not present

## 2022-04-09 ENCOUNTER — Encounter: Payer: Self-pay | Admitting: Internal Medicine

## 2022-04-09 ENCOUNTER — Ambulatory Visit: Payer: Medicare Other | Admitting: Internal Medicine

## 2022-04-09 ENCOUNTER — Ambulatory Visit (INDEPENDENT_AMBULATORY_CARE_PROVIDER_SITE_OTHER): Payer: Medicare Other | Admitting: Internal Medicine

## 2022-04-09 DIAGNOSIS — N1832 Chronic kidney disease, stage 3b: Secondary | ICD-10-CM | POA: Diagnosis not present

## 2022-04-09 DIAGNOSIS — G4730 Sleep apnea, unspecified: Secondary | ICD-10-CM | POA: Diagnosis not present

## 2022-04-09 DIAGNOSIS — I779 Disorder of arteries and arterioles, unspecified: Secondary | ICD-10-CM | POA: Diagnosis not present

## 2022-04-09 DIAGNOSIS — R739 Hyperglycemia, unspecified: Secondary | ICD-10-CM

## 2022-04-09 DIAGNOSIS — I10 Essential (primary) hypertension: Secondary | ICD-10-CM

## 2022-04-09 DIAGNOSIS — E785 Hyperlipidemia, unspecified: Secondary | ICD-10-CM

## 2022-04-09 NOTE — Progress Notes (Signed)
Patient ID: Amber Bridges, female   DOB: [birth year], 81 y.o.   MRN: [id number]   Subjective:    Patient ID: Amber Bridges, female    DOB: [birth year], 82 y.o.   MRN: [id number]   Patient here for a scheduled follow up.   Chief Complaint  Patient presents with   Hypertension   .   HPI Recently evaluated by cardiology.  Lexiscan - normal myocardial perfusion without evidence of myocardial ischemia.  ECHO - mild LVH, moderate mitral regurgitation with EF 55% and mild pulmonary hypertension. Feels better since stopping hydralazine.  Feels back to normal.  Breathing stable.  No chest pain.  No increased cough or congestion.  No acid reflux.  No abdominal pain. Bowels moving.  Blood pressures - 129-134/70.  Recheck here 122/64.    Past Medical History:  Diagnosis Date   Arthritis    Bronchitis    Coronary artery disease    Depression    HOH (hard of hearing)    AIDS   Hypertension    Sleep apnea    CPAP   Past Surgical History:  Procedure Laterality Date   ABDOMINAL HYSTERECTOMY     APPENDECTOMY     BACK SURGERY     X 3   CATARACT EXTRACTION W/ INTRAOCULAR LENS IMPLANT Right 2017   CATARACT EXTRACTION W/PHACO Left 08/08/2016   Procedure: CATARACT EXTRACTION PHACO AND INTRAOCULAR LENS PLACEMENT ([place]);  Surgeon: Estill Cotta, MD;  Location: [facility];  Service: Ophthalmology;  Laterality: Left;  fluid lot # [id number]  exp02/28/2019 [place]    01:15.3 AP%   25.2 CDE   35.06    CTR Right    FOOT SURGERY     JOINT REPLACEMENT     TKR   KNEE ARTHROPLASTY Left 11/24/2018   Procedure: COMPUTER ASSISTED TOTAL KNEE ARTHROPLASTY-LEFT;  Surgeon: Dereck Leep, MD;  Location: [facility];  Service: Orthopedics;  Laterality: Left;   NOSE SURGERY     RCR Left    SHOULDER ARTHROSCOPY Right    for bursitis   SPINE SURGERY  1973, 1978, 2016   Most recent Laminectomy to T11 and L3   Family History  Problem Relation Age of Onset   Congestive Heart Failure Mother    Diabetes  Father    Congestive Heart Failure Father    Congestive Heart Failure Sister    Congestive Heart Failure Brother    Social History   Socioeconomic History   Marital status: Married    Spouse name: Not on file   Number of children: Not on file   Years of education: Not on file   Highest education level: Not on file  Occupational History   Not on file  Tobacco Use   Smoking status: Former    Types: Cigarettes    Quit date: 10/08/1960    Years since quitting: 61.5   Smokeless tobacco: Never  Vaping Use   Vaping Use: Never used  Substance and Sexual Activity   Alcohol use: Yes    Comment: 2 cocktails   Drug use: Never   Sexual activity: Not on file  Other Topics Concern   Not on file  Social History Narrative   Not on file   Social Determinants of Health   Financial Resource Strain: Not on file  Food Insecurity: Not on file  Transportation Needs: Not on file  Physical Activity: Not on file  Stress: Not on file  Social Connections: Not on file     Review of  Patient ID: Amber Bridges, female   DOB: [birth year], 81 y.o.   MRN: [id number]   Subjective:    Patient ID: Amber Bridges, female    DOB: [birth year], 82 y.o.   MRN: [id number]   Patient here for a scheduled follow up.   Chief Complaint  Patient presents with   Hypertension   .   HPI Recently evaluated by cardiology.  Lexiscan - normal myocardial perfusion without evidence of myocardial ischemia.  ECHO - mild LVH, moderate mitral regurgitation with EF 55% and mild pulmonary hypertension. Feels better since stopping hydralazine.  Feels back to normal.  Breathing stable.  No chest pain.  No increased cough or congestion.  No acid reflux.  No abdominal pain. Bowels moving.  Blood pressures - 129-134/70.  Recheck here 122/64.    Past Medical History:  Diagnosis Date   Arthritis    Bronchitis    Coronary artery disease    Depression    HOH (hard of hearing)    AIDS   Hypertension    Sleep apnea    CPAP   Past Surgical History:  Procedure Laterality Date   ABDOMINAL HYSTERECTOMY     APPENDECTOMY     BACK SURGERY     X 3   CATARACT EXTRACTION W/ INTRAOCULAR LENS IMPLANT Right 2017   CATARACT EXTRACTION W/PHACO Left 08/08/2016   Procedure: CATARACT EXTRACTION PHACO AND INTRAOCULAR LENS PLACEMENT ([place]);  Surgeon: Estill Cotta, MD;  Location: [facility];  Service: Ophthalmology;  Laterality: Left;  fluid lot # [id number]  exp02/28/2019 [place]    01:15.3 AP%   25.2 CDE   35.06    CTR Right    FOOT SURGERY     JOINT REPLACEMENT     TKR   KNEE ARTHROPLASTY Left 11/24/2018   Procedure: COMPUTER ASSISTED TOTAL KNEE ARTHROPLASTY-LEFT;  Surgeon: Dereck Leep, MD;  Location: [facility];  Service: Orthopedics;  Laterality: Left;   NOSE SURGERY     RCR Left    SHOULDER ARTHROSCOPY Right    for bursitis   SPINE SURGERY  1973, 1978, 2016   Most recent Laminectomy to T11 and L3   Family History  Problem Relation Age of Onset   Congestive Heart Failure Mother    Diabetes  Father    Congestive Heart Failure Father    Congestive Heart Failure Sister    Congestive Heart Failure Brother    Social History   Socioeconomic History   Marital status: Married    Spouse name: Not on file   Number of children: Not on file   Years of education: Not on file   Highest education level: Not on file  Occupational History   Not on file  Tobacco Use   Smoking status: Former    Types: Cigarettes    Quit date: 10/08/1960    Years since quitting: 61.5   Smokeless tobacco: Never  Vaping Use   Vaping Use: Never used  Substance and Sexual Activity   Alcohol use: Yes    Comment: 2 cocktails   Drug use: Never   Sexual activity: Not on file  Other Topics Concern   Not on file  Social History Narrative   Not on file   Social Determinants of Health   Financial Resource Strain: Not on file  Food Insecurity: Not on file  Transportation Needs: Not on file  Physical Activity: Not on file  Stress: Not on file  Social Connections: Not on file     Review of  Patient ID: Amber Bridges, female   DOB: [birth year], 81 y.o.   MRN: [id number]   Subjective:    Patient ID: Amber Bridges, female    DOB: [birth year], 82 y.o.   MRN: [id number]   Patient here for a scheduled follow up.   Chief Complaint  Patient presents with   Hypertension   .   HPI Recently evaluated by cardiology.  Lexiscan - normal myocardial perfusion without evidence of myocardial ischemia.  ECHO - mild LVH, moderate mitral regurgitation with EF 55% and mild pulmonary hypertension. Feels better since stopping hydralazine.  Feels back to normal.  Breathing stable.  No chest pain.  No increased cough or congestion.  No acid reflux.  No abdominal pain. Bowels moving.  Blood pressures - 129-134/70.  Recheck here 122/64.    Past Medical History:  Diagnosis Date   Arthritis    Bronchitis    Coronary artery disease    Depression    HOH (hard of hearing)    AIDS   Hypertension    Sleep apnea    CPAP   Past Surgical History:  Procedure Laterality Date   ABDOMINAL HYSTERECTOMY     APPENDECTOMY     BACK SURGERY     X 3   CATARACT EXTRACTION W/ INTRAOCULAR LENS IMPLANT Right 2017   CATARACT EXTRACTION W/PHACO Left 08/08/2016   Procedure: CATARACT EXTRACTION PHACO AND INTRAOCULAR LENS PLACEMENT ([place]);  Surgeon: Estill Cotta, MD;  Location: [facility];  Service: Ophthalmology;  Laterality: Left;  fluid lot # [id number]  exp02/28/2019 [place]    01:15.3 AP%   25.2 CDE   35.06    CTR Right    FOOT SURGERY     JOINT REPLACEMENT     TKR   KNEE ARTHROPLASTY Left 11/24/2018   Procedure: COMPUTER ASSISTED TOTAL KNEE ARTHROPLASTY-LEFT;  Surgeon: Dereck Leep, MD;  Location: [facility];  Service: Orthopedics;  Laterality: Left;   NOSE SURGERY     RCR Left    SHOULDER ARTHROSCOPY Right    for bursitis   SPINE SURGERY  1973, 1978, 2016   Most recent Laminectomy to T11 and L3   Family History  Problem Relation Age of Onset   Congestive Heart Failure Mother    Diabetes  Father    Congestive Heart Failure Father    Congestive Heart Failure Sister    Congestive Heart Failure Brother    Social History   Socioeconomic History   Marital status: Married    Spouse name: Not on file   Number of children: Not on file   Years of education: Not on file   Highest education level: Not on file  Occupational History   Not on file  Tobacco Use   Smoking status: Former    Types: Cigarettes    Quit date: 10/08/1960    Years since quitting: 61.5   Smokeless tobacco: Never  Vaping Use   Vaping Use: Never used  Substance and Sexual Activity   Alcohol use: Yes    Comment: 2 cocktails   Drug use: Never   Sexual activity: Not on file  Other Topics Concern   Not on file  Social History Narrative   Not on file   Social Determinants of Health   Financial Resource Strain: Not on file  Food Insecurity: Not on file  Transportation Needs: Not on file  Physical Activity: Not on file  Stress: Not on file  Social Connections: Not on file     Review of

## 2022-04-13 ENCOUNTER — Other Ambulatory Visit: Payer: Self-pay | Admitting: Internal Medicine

## 2022-04-15 ENCOUNTER — Encounter: Payer: Self-pay | Admitting: Internal Medicine

## 2022-04-15 NOTE — Assessment & Plan Note (Signed)
CPAP.  

## 2022-04-15 NOTE — Assessment & Plan Note (Signed)
Low carb diet and exercise.  Follow met b and a1c.  

## 2022-04-15 NOTE — Assessment & Plan Note (Signed)
Blood pressure as outlined.  122/64 - rechecked by me.  Continue losartan, hctz, amlodipine.  Feels better off hydralazine. Follow pressures.  Follow metabolic panel.

## 2022-04-15 NOTE — Assessment & Plan Note (Signed)
Continue lipitor.  Low cholesterol diet and exercise.  Follow lipid panel and liver function tests.   

## 2022-04-15 NOTE — Assessment & Plan Note (Signed)
Avoid antiinflammatories.  Continue losartan.  Follow metabolic panel.  

## 2022-04-15 NOTE — Assessment & Plan Note (Signed)
Recently evaluated by Dr Schnier (09/11/21) - f/u carotid - 1-39% bilaterally.  Stable.  Recommended f/u in 12 months.  

## 2022-04-18 DIAGNOSIS — D485 Neoplasm of uncertain behavior of skin: Secondary | ICD-10-CM | POA: Diagnosis not present

## 2022-04-18 DIAGNOSIS — C44319 Basal cell carcinoma of skin of other parts of face: Secondary | ICD-10-CM | POA: Diagnosis not present

## 2022-04-19 ENCOUNTER — Telehealth: Payer: Self-pay

## 2022-04-19 NOTE — Telephone Encounter (Signed)
I called and spoke with patient to try to get her scheduled with Dr. Maudie Mercury for AWV. Pt stated that she had never done a AWV with anyone here & did not feel the need to. She said that she sees Dr. Nicki Reaper regularly as advised & that she had someone from insurance come in her home annually as well. She declined scheduling an AWV.

## 2022-05-07 ENCOUNTER — Ambulatory Visit: Payer: Medicare Other | Admitting: Internal Medicine

## 2022-05-09 ENCOUNTER — Other Ambulatory Visit: Payer: Self-pay | Admitting: Internal Medicine

## 2022-05-16 ENCOUNTER — Other Ambulatory Visit: Payer: Self-pay | Admitting: Internal Medicine

## 2022-05-19 DIAGNOSIS — M5416 Radiculopathy, lumbar region: Secondary | ICD-10-CM | POA: Diagnosis not present

## 2022-05-30 DIAGNOSIS — C44319 Basal cell carcinoma of skin of other parts of face: Secondary | ICD-10-CM | POA: Diagnosis not present

## 2022-05-30 DIAGNOSIS — C4491 Basal cell carcinoma of skin, unspecified: Secondary | ICD-10-CM | POA: Diagnosis not present

## 2022-06-07 ENCOUNTER — Other Ambulatory Visit (INDEPENDENT_AMBULATORY_CARE_PROVIDER_SITE_OTHER): Payer: Medicare Other

## 2022-06-07 DIAGNOSIS — R739 Hyperglycemia, unspecified: Secondary | ICD-10-CM

## 2022-06-07 DIAGNOSIS — I10 Essential (primary) hypertension: Secondary | ICD-10-CM

## 2022-06-07 DIAGNOSIS — E785 Hyperlipidemia, unspecified: Secondary | ICD-10-CM | POA: Diagnosis not present

## 2022-06-07 LAB — CBC WITH DIFFERENTIAL/PLATELET
Basophils Absolute: 0 10*3/uL (ref 0.0–0.1)
Basophils Relative: 0.6 % (ref 0.0–3.0)
Eosinophils Absolute: 0.3 10*3/uL (ref 0.0–0.7)
Eosinophils Relative: 5.7 % — ABNORMAL HIGH (ref 0.0–5.0)
HCT: 36.4 % (ref 36.0–46.0)
Hemoglobin: 12.2 g/dL (ref 12.0–15.0)
Lymphocytes Relative: 23.1 % (ref 12.0–46.0)
Lymphs Abs: 1.1 10*3/uL (ref 0.7–4.0)
MCHC: 33.5 g/dL (ref 30.0–36.0)
MCV: 93.7 fl (ref 78.0–100.0)
Monocytes Absolute: 0.5 10*3/uL (ref 0.1–1.0)
Monocytes Relative: 9.2 % (ref 3.0–12.0)
Neutro Abs: 3 10*3/uL (ref 1.4–7.7)
Neutrophils Relative %: 61.4 % (ref 43.0–77.0)
Platelets: 173 10*3/uL (ref 150.0–400.0)
RBC: 3.88 Mil/uL (ref 3.87–5.11)
RDW: 14 % (ref 11.5–15.5)
WBC: 4.9 10*3/uL (ref 4.0–10.5)

## 2022-06-07 LAB — HEPATIC FUNCTION PANEL
ALT: 15 U/L (ref 0–35)
AST: 17 U/L (ref 0–37)
Albumin: 4.1 g/dL (ref 3.5–5.2)
Alkaline Phosphatase: 65 U/L (ref 39–117)
Bilirubin, Direct: 0.2 mg/dL (ref 0.0–0.3)
Total Bilirubin: 0.7 mg/dL (ref 0.2–1.2)
Total Protein: 7 g/dL (ref 6.0–8.3)

## 2022-06-07 LAB — BASIC METABOLIC PANEL
BUN: 24 mg/dL — ABNORMAL HIGH (ref 6–23)
CO2: 25 mEq/L (ref 19–32)
Calcium: 9.3 mg/dL (ref 8.4–10.5)
Chloride: 104 mEq/L (ref 96–112)
Creatinine, Ser: 1.27 mg/dL — ABNORMAL HIGH (ref 0.40–1.20)
GFR: 39.71 mL/min — ABNORMAL LOW (ref >60.00)
Glucose, Bld: 116 mg/dL — ABNORMAL HIGH (ref 70–99)
Potassium: 3.9 mEq/L (ref 3.5–5.1)
Sodium: 139 mEq/L (ref 135–145)

## 2022-06-07 LAB — LIPID PANEL
Cholesterol: 136 mg/dL (ref 0–200)
HDL: 58.2 mg/dL (ref >39.00)
LDL Cholesterol: 61 mg/dL (ref 0–99)
NonHDL: 77.67
Total CHOL/HDL Ratio: 2
Triglycerides: 81 mg/dL (ref 0.0–149.0)
VLDL: 16.2 mg/dL (ref 0.0–40.0)

## 2022-06-07 LAB — HEMOGLOBIN A1C: Hgb A1c MFr Bld: 6.2 % (ref 4.6–6.5)

## 2022-06-07 LAB — TSH: TSH: 1.39 u[IU]/mL (ref 0.35–5.50)

## 2022-06-12 ENCOUNTER — Ambulatory Visit (INDEPENDENT_AMBULATORY_CARE_PROVIDER_SITE_OTHER): Payer: Medicare Other | Admitting: Internal Medicine

## 2022-06-12 ENCOUNTER — Encounter: Payer: Self-pay | Admitting: Internal Medicine

## 2022-06-12 DIAGNOSIS — R319 Hematuria, unspecified: Secondary | ICD-10-CM | POA: Insufficient documentation

## 2022-06-12 DIAGNOSIS — N1832 Chronic kidney disease, stage 3b: Secondary | ICD-10-CM | POA: Diagnosis not present

## 2022-06-12 DIAGNOSIS — F32A Depression, unspecified: Secondary | ICD-10-CM

## 2022-06-12 DIAGNOSIS — R739 Hyperglycemia, unspecified: Secondary | ICD-10-CM

## 2022-06-12 DIAGNOSIS — E785 Hyperlipidemia, unspecified: Secondary | ICD-10-CM | POA: Diagnosis not present

## 2022-06-12 DIAGNOSIS — I779 Disorder of arteries and arterioles, unspecified: Secondary | ICD-10-CM

## 2022-06-12 DIAGNOSIS — I10 Essential (primary) hypertension: Secondary | ICD-10-CM

## 2022-06-12 DIAGNOSIS — I340 Nonrheumatic mitral (valve) insufficiency: Secondary | ICD-10-CM | POA: Diagnosis not present

## 2022-06-12 DIAGNOSIS — G4730 Sleep apnea, unspecified: Secondary | ICD-10-CM | POA: Diagnosis not present

## 2022-06-12 NOTE — Progress Notes (Signed)
Patient ID: Amber Bridges, female   DOB: 03/05/1941, 81 y.o.   MRN: [id number]   Subjective:    Patient ID: Amber Bridges, female    DOB: 08/21/1941, 81 y.o.   MRN: [id number]   Patient here for  Chief Complaint  Patient presents with   Follow-up    2 mo/ fell/ 3 surgeries done (wrist, behind ear and face)    .   HPI Here to follow up regarding hypertension.  Reports she is doing relatively well.  Seeing dermatology -  removal - skin lesions.  Planning for biopsy - left neck 9/12.  Tries to stay as active as possible.  No chest pain.  Breathing stable.  No acid reflux reported.  No abdominal pain.  8/6 - fell.  Stubbed her toe.  Fell forward.  No residual problems.  No head injury.  Notced x 1 - hematuria.     Past Medical History:  Diagnosis Date   Arthritis    Bronchitis    Coronary artery disease    Depression    HOH (hard of hearing)    AIDS   Hypertension    Sleep apnea    CPAP   Past Surgical History:  Procedure Laterality Date   ABDOMINAL HYSTERECTOMY     APPENDECTOMY     BACK SURGERY     X 3   CATARACT EXTRACTION W/ INTRAOCULAR LENS IMPLANT Right 2017   CATARACT EXTRACTION W/PHACO Left 08/08/2016   Procedure: CATARACT EXTRACTION PHACO AND INTRAOCULAR LENS PLACEMENT ([facility]);  Surgeon: Sallee Lange, MD;  Location: [facility];  Service: Ophthalmology;  Laterality: Left;  fluid lot # [id number]  exp02/28/2019 [place]    01:15.3 AP%   25.2 CDE   35.06    CTR Right    FOOT SURGERY     JOINT REPLACEMENT     TKR   KNEE ARTHROPLASTY Left 11/24/2018   Procedure: COMPUTER ASSISTED TOTAL KNEE ARTHROPLASTY-LEFT;  Surgeon: Donato Heinz, MD;  Location: [facility];  Service: Orthopedics;  Laterality: Left;   NOSE SURGERY     RCR Left    SHOULDER ARTHROSCOPY Right    for bursitis   SPINE SURGERY  1973, 1978, 2016   Most recent Laminectomy to T11 and L3   Family History  Problem Relation Age of Onset   Congestive Heart Failure Mother    Diabetes Father     Congestive Heart Failure Father    Congestive Heart Failure Sister    Congestive Heart Failure Brother    Social History   Socioeconomic History   Marital status: Married    Spouse name: Not on file   Number of children: Not on file   Years of education: Not on file   Highest education level: Not on file  Occupational History   Not on file  Tobacco Use   Smoking status: Former    Types: Cigarettes    Quit date: 10/08/1960    Years since quitting: 61.7   Smokeless tobacco: Never  Vaping Use   Vaping Use: Never used  Substance and Sexual Activity   Alcohol use: Yes    Comment: 2 cocktails   Drug use: Never   Sexual activity: Not on file  Other Topics Concern   Not on file  Social History Narrative   Not on file   Social Determinants of Health   Financial Resource Strain: Not on file  Food Insecurity: Not on file  Transportation Needs: Not on file  Physical Activity: Not on  file  Stress: Not on file  Social Connections: Not on file     Review of Systems  Constitutional:  Negative for appetite change and unexpected weight change.  HENT:  Negative for congestion and sinus pressure.   Respiratory:  Negative for cough, chest tightness and shortness of breath.   Cardiovascular:  Negative for chest pain, palpitations and leg swelling.  Gastrointestinal:  Negative for abdominal pain, diarrhea, nausea and vomiting.  Genitourinary:  Negative for difficulty urinating and dysuria.  Musculoskeletal:  Negative for joint swelling and myalgias.  Skin:  Negative for color change and rash.  Neurological:  Negative for dizziness, light-headedness and headaches.  Psychiatric/Behavioral:  Negative for agitation and dysphoric mood.        Objective:     BP 130/70 (BP Location: Right Arm, Patient Position: Sitting, Cuff Size: Normal)   Pulse 77   Temp 99.1 F (37.3 C) (Oral)   Ht 5' 7.5" (1.715 m)   Wt 241 lb 3.2 oz (109.4 kg)   SpO2 96%   BMI 37.22 kg/m  Wt Readings from  Last 3 Encounters:  06/12/22 241 lb 3.2 oz (109.4 kg)  04/09/22 240 lb (108.9 kg)  02/15/22 243 lb (110.2 kg)    Physical Exam Vitals reviewed.  Constitutional:      General: She is not in acute distress.    Appearance: Normal appearance.  HENT:     Head: Normocephalic and atraumatic.     Right Ear: External ear normal.     Left Ear: External ear normal.  Eyes:     General: No scleral icterus.       Right eye: No discharge.        Left eye: No discharge.     Conjunctiva/sclera: Conjunctivae normal.  Neck:     Thyroid: No thyromegaly.  Cardiovascular:     Rate and Rhythm: Normal rate and regular rhythm.  Pulmonary:     Effort: No respiratory distress.     Breath sounds: Normal breath sounds. No wheezing.  Abdominal:     General: Bowel sounds are normal.     Palpations: Abdomen is soft.     Tenderness: There is no abdominal tenderness.  Musculoskeletal:        General: No swelling or tenderness.     Cervical back: Neck supple. No tenderness.  Lymphadenopathy:     Cervical: No cervical adenopathy.  Skin:    Findings: No erythema or rash.  Neurological:     Mental Status: She is alert.  Psychiatric:        Mood and Affect: Mood normal.        Behavior: Behavior normal.      Outpatient Encounter Medications as of 06/12/2022  Medication Sig   acetaminophen (TYLENOL) 500 MG tablet Take 500 mg by mouth every 6 (six) hours as needed.   aspirin 81 MG EC tablet Take 81 mg by mouth daily.   atorvastatin (LIPITOR) 40 MG tablet TAKE 1 TABLET(40 MG) BY MOUTH AT BEDTIME   Coenzyme Q10 (COQ10) 200 MG CAPS Take 1 capsule by mouth daily.   Doxylamine Succinate, Sleep, (SLEEP AID PO) Take 1 tablet by mouth at bedtime as needed (sleep).   FLUoxetine (PROZAC) 40 MG capsule TAKE 1 CAPSULE(40 MG) BY MOUTH DAILY   hydrochlorothiazide (HYDRODIURIL) 25 MG tablet TAKE 1 TABLET(25 MG) BY MOUTH EVERY EVENING   ketoconazole (NIZORAL) 2 % cream Apply topically.   losartan (COZAAR) 100 MG  tablet TAKE 1 TABLET(100 MG) BY MOUTH DAILY  Multiple Vitamins-Minerals (CENTRUM SILVER PO) Take 1 tablet by mouth daily.   nystatin cream (MYCOSTATIN) Apply 1 application topically 2 (two) times daily.   Polyethylene Glycol 3350 (MIRALAX PO) Take 17 g by mouth daily as needed (constipation).   senna (SENOKOT) 8.6 MG tablet Take 1 tablet by mouth daily.   traZODone (DESYREL) 50 MG tablet 1/2 tablet q hs prn   [DISCONTINUED] amLODipine (NORVASC) 5 MG tablet TAKE 1 TABLET(5 MG) BY MOUTH TWICE DAILY   [DISCONTINUED] hydrALAZINE (APRESOLINE) 50 MG tablet TAKE 1 TABLET(50 MG) BY MOUTH TWICE DAILY (Patient not taking: Reported on 06/12/2022)   No facility-administered encounter medications on file as of 06/12/2022.     Lab Results  Component Value Date   WBC 4.9 06/07/2022   HGB 12.2 06/07/2022   HCT 36.4 06/07/2022   PLT 173.0 06/07/2022   GLUCOSE 116 (H) 06/07/2022   CHOL 136 06/07/2022   TRIG 81.0 06/07/2022   HDL 58.20 06/07/2022   LDLCALC 61 06/07/2022   ALT 15 06/07/2022   AST 17 06/07/2022   NA 139 06/07/2022   K 3.9 06/07/2022   CL 104 06/07/2022   CREATININE 1.27 (H) 06/07/2022   BUN 24 (H) 06/07/2022   CO2 25 06/07/2022   TSH 1.39 06/07/2022   INR 0.96 11/12/2018   HGBA1C 6.2 06/07/2022       Assessment & Plan:   Problem List Items Addressed This Visit     Carotid artery disease (HCC)    Recently evaluated by Dr Gilda Crease (09/11/21) - f/u carotid - 1-39% bilaterally.  Stable.  Recommended f/u in 12 months.       CKD (chronic kidney disease) stage 3, GFR 30-59 ml/min (HCC)    Avoid antiinflammatories.  Continue losartan.  Follow metabolic panel.       Essential hypertension    Blood pressure as outlined.  Continue losartan, hctz and amlodipine.  No changes.  Follow pressures.  Follow metabolic panel.  Feels better off hydralazine.       Relevant Orders   Basic metabolic panel   Hematuria    Noticed blood in urine x 1.  No vaginal bleeding. No rectal bleeding.   Check urine.        Relevant Orders   Urinalysis, Routine w reflex microscopic (Completed)   Hyperglycemia    Low carb diet and exercise.  Follow met b and a1c.       Relevant Orders   Hemoglobin A1c   Hyperlipidemia    Continue lipitor.  Low cholesterol diet and exercise.  Follow lipid panel and liver function tests.       Relevant Orders   Hepatic function panel   Lipid panel   Mild depression    Continue prozac.  Appears to be handling things relatively well.  Follow.       Moderate mitral insufficiency    ECHO 03/2022 - mild to moderate regurgitation.      Sleep apnea    CPAP.         Dale Bedias, MD

## 2022-06-13 ENCOUNTER — Other Ambulatory Visit: Payer: Self-pay | Admitting: Internal Medicine

## 2022-06-13 LAB — URINALYSIS, ROUTINE W REFLEX MICROSCOPIC
Bilirubin Urine: NEGATIVE
Hgb urine dipstick: NEGATIVE
Ketones, ur: NEGATIVE
Leukocytes,Ua: NEGATIVE
Nitrite: NEGATIVE
RBC / HPF: NONE SEEN (ref 0–?)
Specific Gravity, Urine: 1.02 (ref 1.000–1.030)
Total Protein, Urine: NEGATIVE
Urine Glucose: NEGATIVE
Urobilinogen, UA: 0.2 (ref 0.0–1.0)
pH: 6 (ref 5.0–8.0)

## 2022-06-17 ENCOUNTER — Encounter: Payer: Self-pay | Admitting: Internal Medicine

## 2022-06-17 NOTE — Assessment & Plan Note (Signed)
Recently evaluated by Dr Schnier (09/11/21) - f/u carotid - 1-39% bilaterally.  Stable.  Recommended f/u in 12 months.  

## 2022-06-17 NOTE — Assessment & Plan Note (Signed)
Avoid antiinflammatories.  Continue losartan.  Follow metabolic panel.  

## 2022-06-17 NOTE — Assessment & Plan Note (Signed)
Continue prozac.  Appears to be handling things relatively well.  Follow.

## 2022-06-17 NOTE — Assessment & Plan Note (Signed)
ECHO 03/2022 - mild to moderate regurgitation.

## 2022-06-17 NOTE — Assessment & Plan Note (Signed)
Low carb diet and exercise.  Follow met b and a1c.  

## 2022-06-17 NOTE — Assessment & Plan Note (Signed)
CPAP.  

## 2022-06-17 NOTE — Assessment & Plan Note (Signed)
Noticed blood in urine x 1.  No vaginal bleeding. No rectal bleeding.  Check urine.

## 2022-06-17 NOTE — Assessment & Plan Note (Signed)
Blood pressure as outlined.  Continue losartan, hctz and amlodipine.  No changes.  Follow pressures.  Follow metabolic panel.  Feels better off hydralazine.

## 2022-06-17 NOTE — Assessment & Plan Note (Signed)
Continue lipitor.  Low cholesterol diet and exercise.  Follow lipid panel and liver function tests.   

## 2022-06-19 DIAGNOSIS — D044 Carcinoma in situ of skin of scalp and neck: Secondary | ICD-10-CM | POA: Diagnosis not present

## 2022-06-19 DIAGNOSIS — D485 Neoplasm of uncertain behavior of skin: Secondary | ICD-10-CM | POA: Diagnosis not present

## 2022-06-19 DIAGNOSIS — L2089 Other atopic dermatitis: Secondary | ICD-10-CM | POA: Diagnosis not present

## 2022-06-19 DIAGNOSIS — L218 Other seborrheic dermatitis: Secondary | ICD-10-CM | POA: Diagnosis not present

## 2022-06-27 ENCOUNTER — Other Ambulatory Visit: Payer: Self-pay | Admitting: Internal Medicine

## 2022-07-08 ENCOUNTER — Other Ambulatory Visit: Payer: Self-pay | Admitting: Internal Medicine

## 2022-07-30 DIAGNOSIS — C4442 Squamous cell carcinoma of skin of scalp and neck: Secondary | ICD-10-CM | POA: Diagnosis not present

## 2022-07-30 DIAGNOSIS — D044 Carcinoma in situ of skin of scalp and neck: Secondary | ICD-10-CM | POA: Diagnosis not present

## 2022-08-06 ENCOUNTER — Encounter (INDEPENDENT_AMBULATORY_CARE_PROVIDER_SITE_OTHER): Payer: Self-pay

## 2022-08-13 DIAGNOSIS — M5412 Radiculopathy, cervical region: Secondary | ICD-10-CM | POA: Diagnosis not present

## 2022-08-13 DIAGNOSIS — M542 Cervicalgia: Secondary | ICD-10-CM | POA: Diagnosis not present

## 2022-08-15 ENCOUNTER — Encounter: Payer: Self-pay | Admitting: Internal Medicine

## 2022-08-15 DIAGNOSIS — M542 Cervicalgia: Secondary | ICD-10-CM | POA: Insufficient documentation

## 2022-08-16 ENCOUNTER — Ambulatory Visit (INDEPENDENT_AMBULATORY_CARE_PROVIDER_SITE_OTHER): Payer: Medicare Other

## 2022-08-16 ENCOUNTER — Ambulatory Visit (INDEPENDENT_AMBULATORY_CARE_PROVIDER_SITE_OTHER): Payer: Medicare Other | Admitting: Internal Medicine

## 2022-08-16 ENCOUNTER — Encounter: Payer: Self-pay | Admitting: Internal Medicine

## 2022-08-16 VITALS — BP 130/70 | HR 52 | Temp 98.3°F | Ht 65.0 in | Wt 242.2 lb

## 2022-08-16 DIAGNOSIS — I10 Essential (primary) hypertension: Secondary | ICD-10-CM | POA: Diagnosis not present

## 2022-08-16 DIAGNOSIS — E785 Hyperlipidemia, unspecified: Secondary | ICD-10-CM | POA: Diagnosis not present

## 2022-08-16 DIAGNOSIS — R739 Hyperglycemia, unspecified: Secondary | ICD-10-CM | POA: Diagnosis not present

## 2022-08-16 DIAGNOSIS — G4730 Sleep apnea, unspecified: Secondary | ICD-10-CM | POA: Diagnosis not present

## 2022-08-16 DIAGNOSIS — R0602 Shortness of breath: Secondary | ICD-10-CM | POA: Diagnosis not present

## 2022-08-16 DIAGNOSIS — I447 Left bundle-branch block, unspecified: Secondary | ICD-10-CM | POA: Diagnosis not present

## 2022-08-16 DIAGNOSIS — I779 Disorder of arteries and arterioles, unspecified: Secondary | ICD-10-CM

## 2022-08-16 DIAGNOSIS — N1832 Chronic kidney disease, stage 3b: Secondary | ICD-10-CM

## 2022-08-16 LAB — CBC WITH DIFFERENTIAL/PLATELET
Basophils Absolute: 0 10*3/uL (ref 0.0–0.1)
Basophils Relative: 0.6 % (ref 0.0–3.0)
Eosinophils Absolute: 0 10*3/uL (ref 0.0–0.7)
Eosinophils Relative: 0 % (ref 0.0–5.0)
HCT: 37.1 % (ref 36.0–46.0)
Hemoglobin: 12.3 g/dL (ref 12.0–15.0)
Lymphocytes Relative: 9.4 % — ABNORMAL LOW (ref 12.0–46.0)
Lymphs Abs: 0.7 10*3/uL (ref 0.7–4.0)
MCHC: 33.1 g/dL (ref 30.0–36.0)
MCV: 94.5 fl (ref 78.0–100.0)
Monocytes Absolute: 0.3 10*3/uL (ref 0.1–1.0)
Monocytes Relative: 3.7 % (ref 3.0–12.0)
Neutro Abs: 6.3 10*3/uL (ref 1.4–7.7)
Neutrophils Relative %: 86.3 % — ABNORMAL HIGH (ref 43.0–77.0)
Platelets: 241 10*3/uL (ref 150.0–400.0)
RBC: 3.92 Mil/uL (ref 3.87–5.11)
RDW: 13.4 % (ref 11.5–15.5)
WBC: 7.3 10*3/uL (ref 4.0–10.5)

## 2022-08-16 LAB — BASIC METABOLIC PANEL
BUN: 32 mg/dL — ABNORMAL HIGH (ref 6–23)
CO2: 27 mEq/L (ref 19–32)
Calcium: 9.5 mg/dL (ref 8.4–10.5)
Chloride: 102 mEq/L (ref 96–112)
Creatinine, Ser: 1.13 mg/dL (ref 0.40–1.20)
GFR: 45.62 mL/min — ABNORMAL LOW (ref >60.00)
Glucose, Bld: 115 mg/dL — ABNORMAL HIGH (ref 70–99)
Potassium: 4.6 mEq/L (ref 3.5–5.1)
Sodium: 137 mEq/L (ref 135–145)

## 2022-08-16 MED ORDER — AMLODIPINE BESYLATE 5 MG PO TABS: 5.0000 mg | ORAL_TABLET | Freq: Two times a day (BID) | ORAL | 1 refills | Status: DC

## 2022-08-16 NOTE — Patient Instructions (Signed)
Increase amlodipine to '5mg'$  twice a day.

## 2022-08-16 NOTE — Progress Notes (Signed)
Patient ID: Amber Bridges, female   DOB: 1941-01-03, 81 y.o.   MRN: [id number]   Subjective:    Patient ID: Amber Bridges, female    DOB: [birth year], 81 y.o.   MRN: [id number]    Patient here for  Chief Complaint  Patient presents with   DISCUSS AFIB   .   HPI Work in appt to discuss sob with exertion, fatigue and recent episode of elevated blood pressure.  Saw cardiology previously for sob with exertion.  Lexiscan - no ischemia.  ECHO - mild LVH, moderate MR, EF 55% and mild pulmonary hypertension.  Felt better after stopping hydralazine.  Reports recently noticing increased sob with minimal exertion.  Had episode within the last week - sob, felt heart pounding and leg weakness.  Blood pressure elevated. Felt had to sit down.  After sits for a while - feels better.  Has noticed worsening sob with exertion - symptoms with changing sheets, carrying in groceries.  No chest pain.  No increased cough or congestion.  No acid reflux.  No abdominal pain.     Past Medical History:  Diagnosis Date   Arthritis    Bronchitis    Coronary artery disease    Depression    HOH (hard of hearing)    AIDS   Hypertension    Sleep apnea    CPAP   Past Surgical History:  Procedure Laterality Date   ABDOMINAL HYSTERECTOMY     APPENDECTOMY     BACK SURGERY     X 3   CATARACT EXTRACTION W/ INTRAOCULAR LENS IMPLANT Right 2017   CATARACT EXTRACTION W/PHACO Left 08/08/2016   Procedure: CATARACT EXTRACTION PHACO AND INTRAOCULAR LENS PLACEMENT (IOC);  Surgeon: Sallee Lange, MD;  Location: [facility];  Service: Ophthalmology;  Laterality: Left;  fluid lot # [id number]  exp02/28/2019 [place]    01:15.3 AP%   25.2 CDE   35.06    CTR Right    FOOT SURGERY     JOINT REPLACEMENT     TKR   KNEE ARTHROPLASTY Left 11/24/2018   Procedure: COMPUTER ASSISTED TOTAL KNEE ARTHROPLASTY-LEFT;  Surgeon: Donato Heinz, MD;  Location: [facility];  Service: Orthopedics;  Laterality: Left;   NOSE SURGERY     RCR  Left    SHOULDER ARTHROSCOPY Right    for bursitis   SPINE SURGERY  1973, 1978, 2016   Most recent Laminectomy to T11 and L3   Family History  Problem Relation Age of Onset   Congestive Heart Failure Mother    Diabetes Father    Congestive Heart Failure Father    Congestive Heart Failure Sister    Congestive Heart Failure Brother    Social History   Socioeconomic History   Marital status: Married    Spouse name: Not on file   Number of children: Not on file   Years of education: Not on file   Highest education level: Not on file  Occupational History   Not on file  Tobacco Use   Smoking status: Former    Types: Cigarettes    Quit date: 10/08/1960    Years since quitting: 61.9   Smokeless tobacco: Never  Vaping Use   Vaping Use: Never used  Substance and Sexual Activity   Alcohol use: Yes    Comment: 2 cocktails   Drug use: Never   Sexual activity: Not on file  Other Topics Concern   Not on file  Social History Narrative   Not on file  Social Determinants of Health   Financial Resource Strain: Not on file  Food Insecurity: Not on file  Transportation Needs: Not on file  Physical Activity: Not on file  Stress: Not on file  Social Connections: Not on file     Review of Systems  Constitutional:  Positive for fatigue. Negative for appetite change and unexpected weight change.  HENT:  Negative for congestion and sinus pressure.   Respiratory:  Positive for shortness of breath. Negative for cough and chest tightness.   Cardiovascular:  Negative for chest pain.       No increased swelling.   Gastrointestinal:  Negative for abdominal pain, diarrhea, nausea and vomiting.  Genitourinary:  Negative for difficulty urinating and dysuria.  Musculoskeletal:  Negative for joint swelling and myalgias.  Skin:  Negative for color change and rash.  Neurological:  Negative for dizziness and headaches.  Psychiatric/Behavioral:  Negative for agitation and dysphoric mood.         Objective:     BP 130/70 (BP Location: Left Arm, Patient Position: Sitting, Cuff Size: Normal)   Pulse (!) 52   Temp 98.3 F (36.8 C) (Oral)   Ht 5\' 5"  (1.651 m)   Wt 242 lb 3.2 oz (109.9 kg)   SpO2 96%   BMI 40.30 kg/m  Wt Readings from Last 3 Encounters:  08/16/22 242 lb 3.2 oz (109.9 kg)  06/12/22 241 lb 3.2 oz (109.4 kg)  04/09/22 240 lb (108.9 kg)    Physical Exam Vitals reviewed.  Constitutional:      General: She is not in acute distress.    Appearance: Normal appearance.  HENT:     Head: Normocephalic and atraumatic.     Right Ear: External ear normal.     Left Ear: External ear normal.  Eyes:     General: No scleral icterus.       Right eye: No discharge.        Left eye: No discharge.     Conjunctiva/sclera: Conjunctivae normal.  Neck:     Thyroid: No thyromegaly.  Cardiovascular:     Rate and Rhythm: Normal rate and regular rhythm.  Pulmonary:     Effort: No respiratory distress.     Breath sounds: Normal breath sounds. No wheezing.  Abdominal:     General: Bowel sounds are normal.     Palpations: Abdomen is soft.     Tenderness: There is no abdominal tenderness.  Musculoskeletal:        General: No tenderness.     Cervical back: Neck supple. No tenderness.     Comments: No increased swelling.   Lymphadenopathy:     Cervical: No cervical adenopathy.  Skin:    Findings: No erythema or rash.  Neurological:     Mental Status: She is alert.  Psychiatric:        Mood and Affect: Mood normal.        Behavior: Behavior normal.      Outpatient Encounter Medications as of 08/16/2022  Medication Sig   acetaminophen (TYLENOL) 500 MG tablet Take 500 mg by mouth every 6 (six) hours as needed.   aspirin 81 MG EC tablet Take 81 mg by mouth daily.   atorvastatin (LIPITOR) 40 MG tablet TAKE 1 TABLET(40 MG) BY MOUTH AT BEDTIME   Coenzyme Q10 (COQ10) 200 MG CAPS Take 1 capsule by mouth daily.   Doxylamine Succinate, Sleep, (SLEEP AID PO) Take 1  tablet by mouth at bedtime as needed (sleep).   FLUoxetine (PROZAC)  40 MG capsule TAKE 1 CAPSULE(40 MG) BY MOUTH DAILY   hydrochlorothiazide (HYDRODIURIL) 25 MG tablet TAKE 1 TABLET(25 MG) BY MOUTH EVERY EVENING   ketoconazole (NIZORAL) 2 % cream Apply topically.   losartan (COZAAR) 100 MG tablet TAKE 1 TABLET(100 MG) BY MOUTH DAILY   Multiple Vitamins-Minerals (CENTRUM SILVER PO) Take 1 tablet by mouth daily.   nystatin cream (MYCOSTATIN) Apply 1 application topically 2 (two) times daily.   Polyethylene Glycol 3350 (MIRALAX PO) Take 17 g by mouth daily as needed (constipation).   senna (SENOKOT) 8.6 MG tablet Take 1 tablet by mouth daily.   traZODone (DESYREL) 50 MG tablet 1/2 tablet q hs prn   [DISCONTINUED] amLODipine (NORVASC) 5 MG tablet TAKE 1 TABLET(5 MG) BY MOUTH TWICE DAILY   amLODipine (NORVASC) 5 MG tablet Take 1 tablet (5 mg total) by mouth 2 (two) times daily.   No facility-administered encounter medications on file as of 08/16/2022.     Lab Results  Component Value Date   WBC 7.3 08/16/2022   HGB 12.3 08/16/2022   HCT 37.1 08/16/2022   PLT 241.0 08/16/2022   GLUCOSE 115 (H) 08/16/2022   CHOL 136 06/07/2022   TRIG 81.0 06/07/2022   HDL 58.20 06/07/2022   LDLCALC 61 06/07/2022   ALT 15 06/07/2022   AST 17 06/07/2022   NA 137 08/16/2022   K 4.6 08/16/2022   CL 102 08/16/2022   CREATININE 1.13 08/16/2022   BUN 32 (H) 08/16/2022   CO2 27 08/16/2022   TSH 1.39 06/07/2022   INR 0.96 11/12/2018   HGBA1C 6.2 06/07/2022       Assessment & Plan:   Problem List Items Addressed This Visit     Carotid artery disease (HCC)    Recently evaluated by Dr Gilda Crease (09/11/21) - f/u carotid - 1-39% bilaterally.  Stable.  Recommended f/u in 12 months.       Relevant Medications   amLODipine (NORVASC) 5 MG tablet   CKD (chronic kidney disease) stage 3, GFR 30-59 ml/min (HCC)    Avoid antiinflammatories.  Continue losartan.  Follow metabolic panel.       Essential  hypertension    Blood pressure recently elevated with associated sob, etc.  Overall blood pressure averaging - above goal.  Continue losartan, hctz and amlodipine.  Will increase amlodipine to 5mg  bid.  Follow pressures.  Follow metabolic panel       Relevant Medications   amLODipine (NORVASC) 5 MG tablet   Hyperglycemia    Low carb diet and exercise.  Follow met b and a1c.       Hyperlipidemia    Continue lipitor.  Low cholesterol diet and exercise.  Follow lipid panel and liver function tests.       Relevant Medications   amLODipine (NORVASC) 5 MG tablet   LBBB (left bundle branch block)    Noticed previously on EKG.  Cardiac w/up as outlined.  With increased sob with exertion and SB - ventricular rate in the 40s, discussed further cardiac w/up - monitor, etc.  She is agreeable for referral back.       Relevant Medications   amLODipine (NORVASC) 5 MG tablet   Sleep apnea    CPAP      SOB (shortness of breath) - Primary    Has noticed increased sob with exertion.  Increased recently - notices when carrying in groceries and changing sheets. EKG today reveal SR with LBBB ventricular rate 49.  Given symptoms and EKG changes, needs f/u  with cardiology fo for further w/up and evaluation.  Discussed monitor, etc.  Adjust blood pressure medication. Follow closely.   Discussed if any change or worsening symptoms, she is to be evaluated.  Called and spoke to cardiology for work in appt (earlier work in appt).       Relevant Orders   EKG 12-Lead (Completed)   DG Chest 2 View   CBC with Differential/Platelet (Completed)   Basic metabolic panel (Completed)     Dale Lincolnshire, MD

## 2022-08-18 ENCOUNTER — Encounter: Payer: Self-pay | Admitting: Internal Medicine

## 2022-08-18 NOTE — Assessment & Plan Note (Signed)
Continue lipitor.  Low cholesterol diet and exercise.  Follow lipid panel and liver function tests.   

## 2022-08-18 NOTE — Assessment & Plan Note (Signed)
Avoid antiinflammatories.  Continue losartan.  Follow metabolic panel.

## 2022-08-18 NOTE — Assessment & Plan Note (Signed)
Recently evaluated by Dr Delana Meyer (09/11/21) - f/u carotid - 1-39% bilaterally.  Stable.  Recommended f/u in 12 months.

## 2022-08-18 NOTE — Assessment & Plan Note (Signed)
Has noticed increased sob with exertion.  Increased recently - notices when carrying in groceries and changing sheets. EKG today reveal SR with LBBB ventricular rate 49.  Given symptoms and EKG changes, needs f/u with cardiology fo for further w/up and evaluation.  Discussed monitor, etc.  Adjust blood pressure medication. Follow closely.   Discussed if any change or worsening symptoms, she is to be evaluated.  Called and spoke to cardiology for work in appt (earlier work in appt).

## 2022-08-18 NOTE — Assessment & Plan Note (Signed)
Noticed previously on EKG.  Cardiac w/up as outlined.  With increased sob with exertion and SB - ventricular rate in the 40s, discussed further cardiac w/up - monitor, etc.  She is agreeable for referral back.

## 2022-08-18 NOTE — Assessment & Plan Note (Signed)
CPAP.  

## 2022-08-18 NOTE — Assessment & Plan Note (Signed)
Blood pressure recently elevated with associated sob, etc.  Overall blood pressure averaging - above goal.  Continue losartan, hctz and amlodipine.  Will increase amlodipine to '5mg'$  bid.  Follow pressures.  Follow metabolic panel

## 2022-08-18 NOTE — Assessment & Plan Note (Signed)
Low carb diet and exercise.  Follow met b and a1c.  

## 2022-09-03 DIAGNOSIS — I10 Essential (primary) hypertension: Secondary | ICD-10-CM | POA: Diagnosis not present

## 2022-09-03 DIAGNOSIS — R0602 Shortness of breath: Secondary | ICD-10-CM | POA: Diagnosis not present

## 2022-09-03 DIAGNOSIS — I447 Left bundle-branch block, unspecified: Secondary | ICD-10-CM | POA: Diagnosis not present

## 2022-09-03 DIAGNOSIS — E7800 Pure hypercholesterolemia, unspecified: Secondary | ICD-10-CM | POA: Diagnosis not present

## 2022-09-03 DIAGNOSIS — N1831 Chronic kidney disease, stage 3a: Secondary | ICD-10-CM | POA: Diagnosis not present

## 2022-09-03 DIAGNOSIS — R001 Bradycardia, unspecified: Secondary | ICD-10-CM | POA: Diagnosis not present

## 2022-09-03 DIAGNOSIS — I340 Nonrheumatic mitral (valve) insufficiency: Secondary | ICD-10-CM | POA: Diagnosis not present

## 2022-09-03 DIAGNOSIS — G4733 Obstructive sleep apnea (adult) (pediatric): Secondary | ICD-10-CM | POA: Diagnosis not present

## 2022-09-10 ENCOUNTER — Ambulatory Visit (INDEPENDENT_AMBULATORY_CARE_PROVIDER_SITE_OTHER): Payer: Medicare Other

## 2022-09-10 ENCOUNTER — Encounter (INDEPENDENT_AMBULATORY_CARE_PROVIDER_SITE_OTHER): Payer: Self-pay | Admitting: Nurse Practitioner

## 2022-09-10 ENCOUNTER — Ambulatory Visit (INDEPENDENT_AMBULATORY_CARE_PROVIDER_SITE_OTHER): Payer: Medicare Other | Admitting: Nurse Practitioner

## 2022-09-10 VITALS — BP 157/82 | HR 59 | Resp 14 | Ht 69.0 in | Wt 249.0 lb

## 2022-09-10 DIAGNOSIS — I10 Essential (primary) hypertension: Secondary | ICD-10-CM

## 2022-09-10 DIAGNOSIS — I6523 Occlusion and stenosis of bilateral carotid arteries: Secondary | ICD-10-CM

## 2022-09-10 DIAGNOSIS — E785 Hyperlipidemia, unspecified: Secondary | ICD-10-CM | POA: Diagnosis not present

## 2022-09-10 DIAGNOSIS — R600 Localized edema: Secondary | ICD-10-CM

## 2022-09-10 NOTE — Progress Notes (Signed)
Subjective:    Patient ID: [name], female    DOB: [birth year], 81 y.o.   MRN: [id number] Chief Complaint  Patient presents with   Follow-up    ultrasound    The patient is seen for follow up evaluation of carotid stenosis. The carotid stenosis followed by ultrasound.   The patient denies amaurosis fugax. There is no recent history of TIA symptoms or focal motor deficits. There is no prior documented CVA.  The patient has been having some issues with shortness of breath.  She does have an upcoming stress test scheduled.  The patient is taking enteric-coated aspirin 81 mg daily.  There is no history of migraine headaches. There is no history of seizures.  No recent shortening of the patient's walking distance or new symptoms consistent with claudication.  No history of rest pain symptoms. No new ulcers or wounds of the lower extremities have occurred.  There is no history of DVT, PE or superficial thrombophlebitis. No recent episodes of angina or shortness of breath documented.   Carotid Duplex done today shows 1 to 39% stenosis bilaterally.  No change compared to last study in 2022   Review of Systems  All other systems reviewed and are negative.      Objective:   Physical Exam Vitals reviewed.  HENT:     Head: Normocephalic.  Neck:     Vascular: No carotid bruit.  Cardiovascular:     Rate and Rhythm: Normal rate.     Pulses: Normal pulses.  Pulmonary:     Effort: Pulmonary effort is normal.  Skin:    General: Skin is warm and dry.  Neurological:     Mental Status: She is alert and oriented to person, place, and time.  Psychiatric:        Mood and Affect: Mood normal.        Behavior: Behavior normal.        Thought Content: Thought content normal.        Judgment: Judgment normal.     BP (!) 157/82 (BP Location: Left Arm)   Pulse (!) 59   Resp 14   Ht 5\' 9"  (1.753 m)   Wt 249 lb (112.9 kg)   BMI 36.77 kg/m   Past Medical History:   Diagnosis Date   Arthritis    Bronchitis    Coronary artery disease    Depression    HOH (hard of hearing)    AIDS   Hypertension    Sleep apnea    CPAP    Social History   Socioeconomic History   Marital status: Widowed    Spouse name: Not on file   Number of children: Not on file   Years of education: Not on file   Highest education level: Not on file  Occupational History   Not on file  Tobacco Use   Smoking status: Former    Types: Cigarettes    Quit date: 10/08/1960    Years since quitting: 61.9   Smokeless tobacco: Never  Vaping Use   Vaping Use: Never used  Substance and Sexual Activity   Alcohol use: Yes    Comment: 2 cocktails   Drug use: Never   Sexual activity: Not on file  Other Topics Concern   Not on file  Social History Narrative   Not on file   Social Determinants of Health   Financial Resource Strain: Not on file  Food Insecurity: Not on file  Transportation Needs: Not on  file  Physical Activity: Not on file  Stress: Not on file  Social Connections: Not on file  Intimate Partner Violence: Not on file    Past Surgical History:  Procedure Laterality Date   ABDOMINAL HYSTERECTOMY     APPENDECTOMY     BACK SURGERY     X 3   CATARACT EXTRACTION W/ INTRAOCULAR LENS IMPLANT Right 2017   CATARACT EXTRACTION W/PHACO Left 08/08/2016   Procedure: CATARACT EXTRACTION PHACO AND INTRAOCULAR LENS PLACEMENT (IOC);  Surgeon: Sallee Lange, MD;  Location: [facility];  Service: Ophthalmology;  Laterality: Left;  fluid lot # [id number]  exp02/28/2019 [place]    01:15.3 AP%   25.2 CDE   35.06    CTR Right    FOOT SURGERY     JOINT REPLACEMENT     TKR   KNEE ARTHROPLASTY Left 11/24/2018   Procedure: COMPUTER ASSISTED TOTAL KNEE ARTHROPLASTY-LEFT;  Surgeon: Donato Heinz, MD;  Location: [facility];  Service: Orthopedics;  Laterality: Left;   NOSE SURGERY     RCR Left    SHOULDER ARTHROSCOPY Right    for bursitis   SPINE SURGERY  1973, 1978, 2016   Most  recent Laminectomy to T11 and L3    Family History  Problem Relation Age of Onset   Congestive Heart Failure Mother    Diabetes Father    Congestive Heart Failure Father    Congestive Heart Failure Sister    Congestive Heart Failure Brother     Allergies  Allergen Reactions   Influenza Virus Vaccine Other (See Comments) and Swelling    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 Can take flu vaccine that does not relate to eggs Site of flu vaccine. Can take flu vaccine that does not relate to eggs    Penicillins Diarrhea    Has patient had a PCN reaction causing immediate rash, facial/tongue/throat swelling, SOB or lightheadedness with hypotension: No Has patient had a PCN reaction causing severe rash involving mucus membranes or skin necrosis: No Has patient had a PCN reaction that required hospitalization No Has patient had a PCN reaction occurring within the last 10 years: No If all of the above answers are "NO", then may proceed with Cephalosporin use.    Egg White [Egg White (Egg Protein)] Nausea Only   Lisinopril Cough   Haemophilus B Polysaccharide Vaccine Other (See Comments) and Swelling    Site of flu vaccine. Can take flu vaccine that does not relate to eggs       Latest Ref Rng & Units 08/16/2022   12:13 PM 06/07/2022    7:35 AM 02/08/2021    8:56 AM  CBC  WBC 4.0 - 10.5 K/uL 7.3  4.9  4.4   Hemoglobin 12.0 - 15.0 g/dL 13.0  86.5  78.4   Hematocrit 36.0 - 46.0 % 37.1  36.4  37.3   Platelets 150.0 - 400.0 K/uL 241.0  173.0  177.0       CMP     Component Value Date/Time   NA 137 08/16/2022 1213   K 4.6 08/16/2022 1213   CL 102 08/16/2022 1213   CO2 27 08/16/2022 1213   GLUCOSE 115 (H) 08/16/2022 1213   BUN 32 (H) 08/16/2022 1213   CREATININE 1.13 08/16/2022 1213   CALCIUM 9.5 08/16/2022 1213   PROT 7.0 06/07/2022 0735   ALBUMIN 4.1 06/07/2022 0735   AST 17 06/07/2022 0735   ALT 15 06/07/2022 0735   ALKPHOS 65 06/07/2022 0735  BILITOT  0.7 06/07/2022 0735   GFRNONAA 41 (L) 11/12/2018 1148   GFRAA 47 (L) 11/12/2018 1148     No results found.     Assessment & Plan:   1. Bilateral carotid artery stenosis Recommend:  Given the patient's asymptomatic subcritical stenosis no further invasive testing or surgery at this time.  Duplex ultrasound shows <40% stenosis bilaterally.  Continue antiplatelet therapy as prescribed Continue management of CAD, HTN and Hyperlipidemia Healthy heart diet,  encouraged exercise at least 4 times per week Follow up in 12 months with duplex ultrasound and physical exam   2. Essential hypertension Continue antihypertensive medications as already ordered, these medications have been reviewed and there are no changes at this time.  3. Hyperlipidemia, unspecified hyperlipidemia type Continue statin as ordered and reviewed, no changes at this time  4. Lower extremity edema The patient notes that following the increase of her amlodipine she has been having some lower extremity edema.  We discussed conservative treatment for lower extremity edema including use of medical grade compression, elevation and activity once she is tolerable.   Current Outpatient Medications on File Prior to Visit  Medication Sig Dispense Refill   acetaminophen (TYLENOL) 500 MG tablet Take 500 mg by mouth every 6 (six) hours as needed.     amLODipine (NORVASC) 5 MG tablet Take 1 tablet (5 mg total) by mouth 2 (two) times daily. 180 tablet 1   aspirin 81 MG EC tablet Take 81 mg by mouth daily.     atorvastatin (LIPITOR) 40 MG tablet TAKE 1 TABLET(40 MG) BY MOUTH AT BEDTIME 90 tablet 2   Coenzyme Q10 (COQ10) 200 MG CAPS Take 1 capsule by mouth daily.     Doxylamine Succinate, Sleep, (SLEEP AID PO) Take 1 tablet by mouth at bedtime as needed (sleep).     FLUoxetine (PROZAC) 40 MG capsule TAKE 1 CAPSULE(40 MG) BY MOUTH DAILY 90 capsule 1   hydrochlorothiazide (HYDRODIURIL) 25 MG tablet TAKE 1 TABLET(25 MG) BY MOUTH  EVERY EVENING 90 tablet 1   ketoconazole (NIZORAL) 2 % cream Apply topically.     losartan (COZAAR) 100 MG tablet TAKE 1 TABLET(100 MG) BY MOUTH DAILY 90 tablet 1   Multiple Vitamins-Minerals (CENTRUM SILVER PO) Take 1 tablet by mouth daily.     nystatin cream (MYCOSTATIN) Apply 1 application topically 2 (two) times daily. 30 g 0   Polyethylene Glycol 3350 (MIRALAX PO) Take 17 g by mouth daily as needed (constipation).     senna (SENOKOT) 8.6 MG tablet Take 1 tablet by mouth daily.     traZODone (DESYREL) 50 MG tablet 1/2 tablet q hs prn 30 tablet 1   No current facility-administered medications on file prior to visit.    There are no Patient Instructions on file for this visit. No follow-ups on file.   Georgiana Spinner, NP

## 2022-09-17 DIAGNOSIS — I447 Left bundle-branch block, unspecified: Secondary | ICD-10-CM | POA: Diagnosis not present

## 2022-09-17 DIAGNOSIS — R0602 Shortness of breath: Secondary | ICD-10-CM | POA: Diagnosis not present

## 2022-09-19 DIAGNOSIS — R001 Bradycardia, unspecified: Secondary | ICD-10-CM | POA: Diagnosis not present

## 2022-09-30 ENCOUNTER — Other Ambulatory Visit: Payer: Self-pay | Admitting: Internal Medicine

## 2022-10-04 DIAGNOSIS — I447 Left bundle-branch block, unspecified: Secondary | ICD-10-CM | POA: Diagnosis not present

## 2022-10-04 DIAGNOSIS — I442 Atrioventricular block, complete: Secondary | ICD-10-CM | POA: Diagnosis not present

## 2022-10-04 DIAGNOSIS — Z01818 Encounter for other preprocedural examination: Secondary | ICD-10-CM | POA: Diagnosis not present

## 2022-10-04 DIAGNOSIS — I10 Essential (primary) hypertension: Secondary | ICD-10-CM | POA: Diagnosis not present

## 2022-10-04 DIAGNOSIS — G4733 Obstructive sleep apnea (adult) (pediatric): Secondary | ICD-10-CM | POA: Diagnosis not present

## 2022-10-04 DIAGNOSIS — I340 Nonrheumatic mitral (valve) insufficiency: Secondary | ICD-10-CM | POA: Diagnosis not present

## 2022-10-04 DIAGNOSIS — E7800 Pure hypercholesterolemia, unspecified: Secondary | ICD-10-CM | POA: Diagnosis not present

## 2022-10-04 DIAGNOSIS — N1831 Chronic kidney disease, stage 3a: Secondary | ICD-10-CM | POA: Diagnosis not present

## 2022-10-12 ENCOUNTER — Other Ambulatory Visit (INDEPENDENT_AMBULATORY_CARE_PROVIDER_SITE_OTHER): Payer: Medicare Other

## 2022-10-12 DIAGNOSIS — R739 Hyperglycemia, unspecified: Secondary | ICD-10-CM

## 2022-10-12 DIAGNOSIS — I10 Essential (primary) hypertension: Secondary | ICD-10-CM

## 2022-10-12 DIAGNOSIS — E785 Hyperlipidemia, unspecified: Secondary | ICD-10-CM

## 2022-10-12 LAB — HEPATIC FUNCTION PANEL
ALT: 15 U/L (ref 0–35)
AST: 16 U/L (ref 0–37)
Albumin: 4.3 g/dL (ref 3.5–5.2)
Alkaline Phosphatase: 67 U/L (ref 39–117)
Bilirubin, Direct: 0.1 mg/dL (ref 0.0–0.3)
Total Bilirubin: 0.6 mg/dL (ref 0.2–1.2)
Total Protein: 6.7 g/dL (ref 6.0–8.3)

## 2022-10-12 LAB — LIPID PANEL
Cholesterol: 143 mg/dL (ref 0–200)
HDL: 66.6 mg/dL (ref >39.00)
LDL Cholesterol: 60 mg/dL (ref 0–99)
NonHDL: 76.01
Total CHOL/HDL Ratio: 2
Triglycerides: 80 mg/dL (ref 0.0–149.0)
VLDL: 16 mg/dL (ref 0.0–40.0)

## 2022-10-12 LAB — BASIC METABOLIC PANEL
BUN: 27 mg/dL — ABNORMAL HIGH (ref 6–23)
CO2: 26 mEq/L (ref 19–32)
Calcium: 9.4 mg/dL (ref 8.4–10.5)
Chloride: 105 mEq/L (ref 96–112)
Creatinine, Ser: 1.13 mg/dL (ref 0.40–1.20)
GFR: 45.57 mL/min — ABNORMAL LOW (ref >60.00)
Glucose, Bld: 122 mg/dL — ABNORMAL HIGH (ref 70–99)
Potassium: 4.1 mEq/L (ref 3.5–5.1)
Sodium: 141 mEq/L (ref 135–145)

## 2022-10-12 LAB — HEMOGLOBIN A1C: Hgb A1c MFr Bld: 5.9 % (ref 4.6–6.5)

## 2022-10-16 ENCOUNTER — Other Ambulatory Visit: Payer: Self-pay | Admitting: Internal Medicine

## 2022-10-17 ENCOUNTER — Ambulatory Visit: Payer: Medicare Other | Admitting: Internal Medicine

## 2022-10-17 DIAGNOSIS — R001 Bradycardia, unspecified: Secondary | ICD-10-CM | POA: Insufficient documentation

## 2022-10-17 NOTE — Assessment & Plan Note (Deleted)
Saw [facility] - 12.2023 - Duplex ultrasound shows <40% stenosis bilaterally.  Recommended f/u in 12 months.

## 2022-10-17 NOTE — Progress Notes (Deleted)
Subjective:    Patient ID: Amber Bridges, female    DOB: [birth year], 82 y.o.   MRN: [id number]  Patient here for No chief complaint on file.   HPI Here to follow up regarding SOB, hypertension, hyperglycemia and hypercholesterolemia.  Recently evaluated for increased sob, bradycardia and dizziness.  Saw cardiology.  Echocardiogram showed mild LV systolic dysfunction EF 50% likely due to left bundle branch block, normal RV systolic function, moderate valvular regurgitation, no valvular stenosis. Lexiscan showed minimal fixed anterior myocardial perfusion defect consistent with possible previous infarct but more likely breast attenuation without evidence of myocardial ischemia. Holter monitor showed Baseline normal sinus rhythm with maximum heart rate 92 bpm minimum of 38 bpm averages 61 bpm. There is occasional episodes of preatrial and preventricular contraction. There are very frequent episodes second-degree type I AV block but also frequent episodes where the patient has a 2-1 block with heart rate 40 bpm possibly causing potential symptoms. Minimum heart rate was 35 bpm to which does appear to have a short run more advanced heart block. Recommended pacemaker.  Procedure planned for 10/25/22.    Past Medical History:  Diagnosis Date   Arthritis    Bronchitis    Coronary artery disease    Depression    HOH (hard of hearing)    AIDS   Hypertension    Sleep apnea    CPAP   Past Surgical History:  Procedure Laterality Date   ABDOMINAL HYSTERECTOMY     APPENDECTOMY     BACK SURGERY     X 3   CATARACT EXTRACTION W/ INTRAOCULAR LENS IMPLANT Right 2017   CATARACT EXTRACTION W/PHACO Left 08/08/2016   Procedure: CATARACT EXTRACTION PHACO AND INTRAOCULAR LENS PLACEMENT (IOC);  Surgeon: Sallee Lange, MD;  Location: [facility] ORS;  Service: Ophthalmology;  Laterality: Left;  fluid lot # [id number]  exp02/28/2019 [place]    01:15.3 AP%   25.2 CDE   35.06    CTR Right    FOOT SURGERY      JOINT REPLACEMENT     TKR   KNEE ARTHROPLASTY Left 11/24/2018   Procedure: COMPUTER ASSISTED TOTAL KNEE ARTHROPLASTY-LEFT;  Surgeon: Donato Heinz, MD;  Location: [facility] ORS;  Service: Orthopedics;  Laterality: Left;   NOSE SURGERY     RCR Left    SHOULDER ARTHROSCOPY Right    for bursitis   SPINE SURGERY  1973, 1978, 2016   Most recent Laminectomy to T11 and L3   Family History  Problem Relation Age of Onset   Congestive Heart Failure Mother    Diabetes Father    Congestive Heart Failure Father    Congestive Heart Failure Sister    Congestive Heart Failure Brother    Social History   Socioeconomic History   Marital status: Widowed    Spouse name: Not on file   Number of children: Not on file   Years of education: Not on file   Highest education level: Not on file  Occupational History   Not on file  Tobacco Use   Smoking status: Former    Types: Cigarettes    Quit date: 10/08/1960    Years since quitting: 62.0   Smokeless tobacco: Never  Vaping Use   Vaping Use: Never used  Substance and Sexual Activity   Alcohol use: Yes    Comment: 2 cocktails   Drug use: Never   Sexual activity: Not on file  Other Topics Concern   Not on file  Social History Narrative  Not on file   Social Determinants of Health   Financial Resource Strain: Not on file  Food Insecurity: Not on file  Transportation Needs: Not on file  Physical Activity: Not on file  Stress: Not on file  Social Connections: Not on file     Review of Systems     Objective:     There were no vitals taken for this visit. Wt Readings from Last 3 Encounters:  09/10/22 249 lb (112.9 kg)  08/16/22 242 lb 3.2 oz (109.9 kg)  06/12/22 241 lb 3.2 oz (109.4 kg)    Physical Exam   Outpatient Encounter Medications as of 10/17/2022  Medication Sig   acetaminophen (TYLENOL) 500 MG tablet Take 500 mg by mouth every 6 (six) hours as needed.   amLODipine (NORVASC) 5 MG tablet Take 1 tablet (5 mg total) by  mouth 2 (two) times daily.   aspirin 81 MG EC tablet Take 81 mg by mouth daily.   atorvastatin (LIPITOR) 40 MG tablet TAKE 1 TABLET(40 MG) BY MOUTH AT BEDTIME   Coenzyme Q10 (COQ10) 200 MG CAPS Take 1 capsule by mouth daily.   Doxylamine Succinate, Sleep, (SLEEP AID PO) Take 1 tablet by mouth at bedtime as needed (sleep).   FLUoxetine (PROZAC) 40 MG capsule TAKE 1 CAPSULE(40 MG) BY MOUTH DAILY   hydrochlorothiazide (HYDRODIURIL) 25 MG tablet TAKE 1 TABLET(25 MG) BY MOUTH EVERY EVENING   ketoconazole (NIZORAL) 2 % cream Apply topically.   losartan (COZAAR) 100 MG tablet TAKE 1 TABLET(100 MG) BY MOUTH DAILY   Multiple Vitamins-Minerals (CENTRUM SILVER PO) Take 1 tablet by mouth daily.   nystatin cream (MYCOSTATIN) Apply 1 application topically 2 (two) times daily.   Polyethylene Glycol 3350 (MIRALAX PO) Take 17 g by mouth daily as needed (constipation).   senna (SENOKOT) 8.6 MG tablet Take 1 tablet by mouth daily.   traZODone (DESYREL) 50 MG tablet 1/2 tablet q hs prn   No facility-administered encounter medications on file as of 10/17/2022.     Lab Results  Component Value Date   WBC 7.3 08/16/2022   HGB 12.3 08/16/2022   HCT 37.1 08/16/2022   PLT 241.0 08/16/2022   GLUCOSE 122 (H) 10/12/2022   CHOL 143 10/12/2022   TRIG 80.0 10/12/2022   HDL 66.60 10/12/2022   LDLCALC 60 10/12/2022   ALT 15 10/12/2022   AST 16 10/12/2022   NA 141 10/12/2022   K 4.1 10/12/2022   CL 105 10/12/2022   CREATININE 1.13 10/12/2022   BUN 27 (H) 10/12/2022   CO2 26 10/12/2022   TSH 1.39 06/07/2022   INR 0.96 11/12/2018   HGBA1C 5.9 10/12/2022    No results found.     Assessment & Plan:  There are no diagnoses linked to this encounter.   Dale Lacy-Lakeview, MD

## 2022-10-17 NOTE — Assessment & Plan Note (Deleted)
Holter monitor showed Baseline normal sinus rhythm with maximum heart rate 92 bpm minimum of 38 bpm averages 61 bpm. There is occasional episodes of preatrial and preventricular contraction. There are very frequent episodes second-degree type I AV block but also frequent episodes where the patient has a 2-1 block with heart rate 40 bpm possibly causing potential symptoms. Minimum heart rate was 35 bpm to which does appear to have a short run more advanced heart block.

## 2022-10-25 ENCOUNTER — Observation Stay
Admission: RE | Admit: 2022-10-25 | Discharge: 2022-10-26 | Disposition: A | Discharge status: Home / Self Care | Payer: Medicare Other | Attending: Cardiology | Admitting: Cardiology

## 2022-10-25 ENCOUNTER — Other Ambulatory Visit: Payer: Self-pay

## 2022-10-25 ENCOUNTER — Encounter: Payer: Self-pay | Admitting: Cardiology

## 2022-10-25 ENCOUNTER — Encounter
Admission: RE | Disposition: A | Discharge status: Home / Self Care | Payer: Self-pay | Source: Home / Self Care | Attending: Cardiology

## 2022-10-25 DIAGNOSIS — Z7982 Long term (current) use of aspirin: Secondary | ICD-10-CM | POA: Insufficient documentation

## 2022-10-25 DIAGNOSIS — R001 Bradycardia, unspecified: Secondary | ICD-10-CM

## 2022-10-25 DIAGNOSIS — Z006 Encounter for examination for normal comparison and control in clinical research program: Secondary | ICD-10-CM | POA: Diagnosis not present

## 2022-10-25 DIAGNOSIS — R42 Dizziness and giddiness: Secondary | ICD-10-CM | POA: Diagnosis not present

## 2022-10-25 DIAGNOSIS — I442 Atrioventricular block, complete: Principal | ICD-10-CM | POA: Insufficient documentation

## 2022-10-25 DIAGNOSIS — Z79899 Other long term (current) drug therapy: Secondary | ICD-10-CM | POA: Diagnosis not present

## 2022-10-25 DIAGNOSIS — Z859 Personal history of malignant neoplasm, unspecified: Secondary | ICD-10-CM | POA: Diagnosis not present

## 2022-10-25 DIAGNOSIS — I441 Atrioventricular block, second degree: Secondary | ICD-10-CM | POA: Diagnosis not present

## 2022-10-25 DIAGNOSIS — Z8679 Personal history of other diseases of the circulatory system: Secondary | ICD-10-CM | POA: Diagnosis present

## 2022-10-25 DIAGNOSIS — Z87891 Personal history of nicotine dependence: Secondary | ICD-10-CM | POA: Diagnosis not present

## 2022-10-25 DIAGNOSIS — I10 Essential (primary) hypertension: Secondary | ICD-10-CM | POA: Insufficient documentation

## 2022-10-25 HISTORY — PX: PACEMAKER LEADLESS INSERTION: EP1219

## 2022-10-25 SURGERY — PACEMAKER LEADLESS INSERTION
Anesthesia: Moderate Sedation

## 2022-10-25 MED ORDER — FENTANYL CITRATE (PF) 100 MCG/2ML IJ SOLN
INTRAMUSCULAR | Status: AC
  Filled 2022-10-25: qty 2

## 2022-10-25 MED ORDER — MIDAZOLAM HCL 2 MG/2ML IJ SOLN
INTRAMUSCULAR | Status: DC | PRN
  Administered 2022-10-25 (×2): 1 mg via INTRAVENOUS

## 2022-10-25 MED ORDER — MIDAZOLAM HCL 2 MG/2ML IJ SOLN
INTRAMUSCULAR | Status: AC
  Filled 2022-10-25: qty 2

## 2022-10-25 MED ORDER — ONDANSETRON HCL 4 MG/2ML IJ SOLN: 4.0000 mg | Freq: Four times a day (QID) | INTRAMUSCULAR | Status: DC | PRN

## 2022-10-25 MED ORDER — FLUOXETINE HCL 20 MG PO CAPS
40.0000 mg | ORAL_CAPSULE | Freq: Every day | ORAL | Status: DC
  Administered 2022-10-25 – 2022-10-26 (×2): 40 mg via ORAL
  Filled 2022-10-25 (×2): qty 2

## 2022-10-25 MED ORDER — HEPARIN (PORCINE) IN NACL 1000-0.9 UT/500ML-% IV SOLN
INTRAVENOUS | Status: DC | PRN
  Administered 2022-10-25 (×2): 500 mL

## 2022-10-25 MED ORDER — LOSARTAN POTASSIUM 50 MG PO TABS
100.0000 mg | ORAL_TABLET | Freq: Every day | ORAL | Status: DC
  Administered 2022-10-25 – 2022-10-26 (×2): 100 mg via ORAL
  Filled 2022-10-25 (×2): qty 2

## 2022-10-25 MED ORDER — FENTANYL CITRATE (PF) 100 MCG/2ML IJ SOLN
INTRAMUSCULAR | Status: DC | PRN
  Administered 2022-10-25 (×2): 25 ug via INTRAVENOUS

## 2022-10-25 MED ORDER — SODIUM CHLORIDE 0.9% FLUSH: 3.0000 mL | INTRAVENOUS | Status: DC | PRN

## 2022-10-25 MED ORDER — ATORVASTATIN CALCIUM 20 MG PO TABS
40.0000 mg | ORAL_TABLET | Freq: Every evening | ORAL | Status: DC
  Administered 2022-10-25: 40 mg via ORAL
  Filled 2022-10-25 (×2): qty 2

## 2022-10-25 MED ORDER — TRAZODONE HCL 50 MG PO TABS
50.0000 mg | ORAL_TABLET | Freq: Every evening | ORAL | Status: DC | PRN
  Administered 2022-10-25: 50 mg via ORAL
  Filled 2022-10-25: qty 1

## 2022-10-25 MED ORDER — IOHEXOL 300 MG/ML  SOLN
INTRAMUSCULAR | Status: DC | PRN
  Administered 2022-10-25: 15 mL

## 2022-10-25 MED ORDER — SODIUM CHLORIDE 0.9 % IV SOLN: 250.0000 mL | INTRAVENOUS | Status: DC | PRN

## 2022-10-25 MED ORDER — LIDOCAINE HCL (PF) 1 % IJ SOLN
INTRAMUSCULAR | Status: DC | PRN
  Administered 2022-10-25: 30 mL

## 2022-10-25 MED ORDER — SODIUM CHLORIDE 0.9% FLUSH: 3.0000 mL | Freq: Two times a day (BID) | INTRAVENOUS | Status: DC

## 2022-10-25 MED ORDER — SODIUM CHLORIDE 0.9% FLUSH
3.0000 mL | Freq: Two times a day (BID) | INTRAVENOUS | Status: DC
  Administered 2022-10-25 – 2022-10-26 (×2): 3 mL via INTRAVENOUS

## 2022-10-25 MED ORDER — HYDROCHLOROTHIAZIDE 25 MG PO TABS
25.0000 mg | ORAL_TABLET | Freq: Every day | ORAL | Status: DC
  Administered 2022-10-25 – 2022-10-26 (×2): 25 mg via ORAL
  Filled 2022-10-25 (×2): qty 1

## 2022-10-25 MED ORDER — LIDOCAINE HCL 1 % IJ SOLN
INTRAMUSCULAR | Status: AC
  Filled 2022-10-25: qty 40

## 2022-10-25 MED ORDER — SODIUM CHLORIDE 0.9 % IV SOLN: INTRAVENOUS | Status: DC

## 2022-10-25 MED ORDER — AMLODIPINE BESYLATE 5 MG PO TABS
5.0000 mg | ORAL_TABLET | Freq: Every day | ORAL | Status: DC
  Administered 2022-10-26: 5 mg via ORAL
  Filled 2022-10-25: qty 1

## 2022-10-25 MED ORDER — HEPARIN SODIUM (PORCINE) 1000 UNIT/ML IJ SOLN
INTRAMUSCULAR | Status: DC | PRN
  Administered 2022-10-25: 5000 [IU] via INTRAVENOUS

## 2022-10-25 MED ORDER — HEPARIN SODIUM (PORCINE) 1000 UNIT/ML IJ SOLN
INTRAMUSCULAR | Status: AC
  Filled 2022-10-25: qty 10

## 2022-10-25 MED ORDER — ACETAMINOPHEN 325 MG PO TABS
650.0000 mg | ORAL_TABLET | ORAL | Status: DC | PRN
  Administered 2022-10-25: 650 mg via ORAL
  Filled 2022-10-25: qty 2

## 2022-10-25 SURGICAL SUPPLY — 16 items
DILATOR VESSEL 38 20CM 12FR (INTRODUCER) IMPLANT
DILATOR VESSEL 38 20CM 14FR (INTRODUCER) IMPLANT
DILATOR VESSEL 38 20CM 18FR (INTRODUCER) IMPLANT
DILATOR VESSEL 38 20CM 8FR (INTRODUCER) IMPLANT
KIT SYRINGE INJ CVI SPIKEX1 (MISCELLANEOUS) IMPLANT
MICRA INTRODUCER SHEATH (SHEATH) ×1
NDL PERC 18GX7CM (NEEDLE) IMPLANT
NEEDLE PERC 18GX7CM (NEEDLE) ×1 IMPLANT
PACEMAKER LEADLESS AV2 MICRA (Pacemaker) IMPLANT
PACK CARDIAC CATH (CUSTOM PROCEDURE TRAY) ×1 IMPLANT
PAD ELECT DEFIB RADIOL ZOLL (MISCELLANEOUS) IMPLANT
PROTECTION STATION PRESSURIZED (MISCELLANEOUS) ×1
SHEATH AVANTI 7FRX11 (SHEATH) IMPLANT
SHEATH INTRODUCER MICRA (SHEATH) IMPLANT
STATION PROTECTION PRESSURIZED (MISCELLANEOUS) IMPLANT
WIRE AMPLATZ SS-J .035X180CM (WIRE) IMPLANT

## 2022-10-25 NOTE — Discharge Instructions (Signed)
Patient may shower 10/28/2022.

## 2022-10-26 ENCOUNTER — Encounter: Payer: Self-pay | Admitting: Cardiology

## 2022-10-26 DIAGNOSIS — Z006 Encounter for examination for normal comparison and control in clinical research program: Secondary | ICD-10-CM | POA: Diagnosis not present

## 2022-10-26 DIAGNOSIS — Z7982 Long term (current) use of aspirin: Secondary | ICD-10-CM | POA: Diagnosis not present

## 2022-10-26 DIAGNOSIS — R42 Dizziness and giddiness: Secondary | ICD-10-CM | POA: Diagnosis not present

## 2022-10-26 DIAGNOSIS — Z87891 Personal history of nicotine dependence: Secondary | ICD-10-CM | POA: Diagnosis not present

## 2022-10-26 DIAGNOSIS — I10 Essential (primary) hypertension: Secondary | ICD-10-CM | POA: Diagnosis not present

## 2022-10-26 DIAGNOSIS — Z79899 Other long term (current) drug therapy: Secondary | ICD-10-CM | POA: Diagnosis not present

## 2022-10-26 DIAGNOSIS — Z859 Personal history of malignant neoplasm, unspecified: Secondary | ICD-10-CM | POA: Diagnosis not present

## 2022-10-26 DIAGNOSIS — I442 Atrioventricular block, complete: Secondary | ICD-10-CM | POA: Diagnosis not present

## 2022-10-26 LAB — BASIC METABOLIC PANEL
Anion gap: 6 (ref 5–15)
BUN: 24 mg/dL — ABNORMAL HIGH (ref 8–23)
CO2: 23 mmol/L (ref 22–32)
Calcium: 8.6 mg/dL — ABNORMAL LOW (ref 8.9–10.3)
Chloride: 108 mmol/L (ref 98–111)
Creatinine, Ser: 1.19 mg/dL — ABNORMAL HIGH (ref 0.44–1.00)
GFR, Estimated: 46 mL/min — ABNORMAL LOW (ref >60)
Glucose, Bld: 108 mg/dL — ABNORMAL HIGH (ref 70–99)
Potassium: 4 mmol/L (ref 3.5–5.1)
Sodium: 137 mmol/L (ref 135–145)

## 2022-10-26 NOTE — Discharge Summary (Signed)
Physician Discharge Summary  Patient ID: Amber Bridges MRN: [id number] DOB/AGE: [birth year] 82 y.o.  Admit date: 10/25/2022 Discharge date: 10/26/2022  Primary Discharge Diagnosis complete heart block Secondary Discharge Diagnosis bradycardia  Significant Diagnostic Studies: yes  Consults: None  Hospital Course: The patient underwent elective Medtronic AV 2 leadless pacemaker implantation on 10/25/2022 without complication.  She had an uncomplicated hospital stay.  Telemetry revealed just sensing with ventricular pacing.  On the morning of/19/2024 she was able to ambulate without difficulty and was discharged home in stable condition.   Discharge Exam: Blood pressure (!) 149/62, pulse (!) 57, temperature 97.6 F (36.4 C), resp. rate 16, height 5\' 7"  (1.702 m), weight 108.9 kg, SpO2 96 %.  General appearance: alert Head: Normocephalic, without obvious abnormality, atraumatic Eyes: conjunctivae/corneas clear. PERRL, EOM's intact. Fundi benign. Ears: normal TM's and external ear canals both ears Nose: Nares normal. Septum midline. Mucosa normal. No drainage or sinus tenderness. Throat: lips, mucosa, and tongue normal; teeth and gums normal Neck: no adenopathy, no carotid bruit, no JVD, supple, symmetrical, trachea midline, and thyroid not enlarged, symmetric, no tenderness/mass/nodules Back: symmetric, no curvature. ROM normal. No CVA tenderness. Resp: clear to auscultation bilaterally Chest wall: no tenderness Cardio: regular rate and rhythm, S1, S2 normal, no murmur, click, rub or gallop GI: soft, non-tender; bowel sounds normal; no masses,  no organomegaly Extremities: extremities normal, atraumatic, no cyanosis or edema Pulses: 2+ and symmetric Skin: Skin color, texture, turgor normal. No rashes or lesions Lymph nodes: Cervical, supraclavicular, and axillary nodes normal. Neurologic: Grossly normal Incision/Wound: Right groin incision site clean, without bleeding Labs:    Lab Results  Component Value Date   WBC 7.3 08/16/2022   HGB 12.3 08/16/2022   HCT 37.1 08/16/2022   MCV 94.5 08/16/2022   PLT 241.0 08/16/2022    Recent Labs  Lab 10/26/22 0438  NA 137  K 4.0  CL 108  CO2 23  BUN 24*  CREATININE 1.19*  CALCIUM 8.6*  GLUCOSE 108*      Radiology:  EKG: Sinus bradycardia with first-degree AV block and left bundle branch block  FOLLOW UP PLANS AND APPOINTMENTS  Allergies as of 10/26/2022       Reactions   Influenza Virus Vaccine Other (See Comments), Swelling   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 Can take flu vaccine that does not relate to eggs Site of flu vaccine. Can take flu vaccine that does not relate to eggs   Penicillins Diarrhea   Has patient had a PCN reaction causing immediate rash, facial/tongue/throat swelling, SOB or lightheadedness with hypotension: No Has patient had a PCN reaction causing severe rash involving mucus membranes or skin necrosis: No Has patient had a PCN reaction that required hospitalization No Has patient had a PCN reaction occurring within the last 10 years: No If all of the above answers are "NO", then may proceed with Cephalosporin use.   Egg White [egg White (egg Protein)] Nausea Only   Lisinopril Cough   Haemophilus B Polysaccharide Vaccine Other (See Comments), Swelling   Site of flu vaccine. Can take flu vaccine that does not relate to eggs        Medication List     TAKE these medications    acetaminophen 500 MG tablet Commonly known as: TYLENOL Take 500 mg by mouth every 6 (six) hours as needed.   amLODipine 5 MG tablet Commonly known as: NORVASC Take 1 tablet (5 mg total) by mouth 2 (two) times  daily.   aspirin EC 81 MG tablet Take 81 mg by mouth daily.   atorvastatin 40 MG tablet Commonly known as: LIPITOR TAKE 1 TABLET(40 MG) BY MOUTH AT BEDTIME What changed: See the new instructions.   CENTRUM SILVER PO Take 1 tablet by mouth daily.   CoQ10 200 MG  Caps Take 1 capsule by mouth daily.   FLUoxetine 40 MG capsule Commonly known as: PROZAC TAKE 1 CAPSULE(40 MG) BY MOUTH DAILY What changed: See the new instructions.   hydrochlorothiazide 25 MG tablet Commonly known as: HYDRODIURIL TAKE 1 TABLET(25 MG) BY MOUTH EVERY EVENING   ketoconazole 2 % cream Commonly known as: NIZORAL Apply topically.   losartan 100 MG tablet Commonly known as: COZAAR TAKE 1 TABLET(100 MG) BY MOUTH DAILY   MIRALAX PO Take 17 g by mouth daily as needed (constipation).   nystatin cream Commonly known as: MYCOSTATIN Apply 1 application topically 2 (two) times daily.   senna 8.6 MG tablet Commonly known as: SENOKOT Take 1 tablet by mouth daily.   SLEEP AID PO Take 1 tablet by mouth at bedtime as needed (sleep).   traZODone 50 MG tablet Commonly known as: DESYREL 1/2 tablet q hs prn        Follow-up Information     Remmington Urieta, MD Follow up.   Specialty: Cardiology Contact information: [address] [facility] [place] [address] [phone]                 BRING ALL MEDICATIONS WITH YOU TO FOLLOW UP APPOINTMENTS  Time spent with patient to include physician time: 25 minutes Signed:  Marcina Millard MD, PhD, [facility] 10/26/2022, 7:50 AM

## 2022-10-26 NOTE — TOC Initial Note (Signed)
Transition of Care [facility]) - Initial/Assessment Note    Patient Details  Name: Amber Bridges MRN: [id number] Date of Birth: 1941/01/27  Transition of Care [facility]) CM/SW Contact:    Truddie Hidden, RN Phone Number: 10/26/2022, 9:04 AM  Clinical Narrative:                  Transition of Care [facility]) Screening Note   Patient Details  Name: Amber Bridges Date of Birth: March 25, 1941   Transition of Care [facility]) CM/SW Contact:    Truddie Hidden, RN Phone Number: 10/26/2022, 9:05 AM    Transition of Care Department [facility]) has reviewed patient and no TOC needs have been identified at this time. We will continue to monitor patient advancement through interdisciplinary progression rounds. If new patient transition needs arise, please place a TOC consult.          Patient Goals and CMS Choice            Expected Discharge Plan and Services         Expected Discharge Date: 10/26/22                                    Prior Living Arrangements/Services                       Activities of Daily Living Home Assistive Devices/Equipment: CPAP, Eyeglasses ADL Screening (condition at time of admission) Patient's cognitive ability adequate to safely complete daily activities?: Yes Is the patient deaf or have difficulty hearing?: Yes (hearin gaids at home) Does the patient have difficulty seeing, even when wearing glasses/contacts?: No Does the patient have difficulty concentrating, remembering, or making decisions?: No Patient able to express need for assistance with ADLs?: No Does the patient have difficulty dressing or bathing?: No Independently performs ADLs?: Yes (appropriate for developmental age) Communication: Independent Dressing (OT): Independent Grooming: Independent Feeding: Independent Bathing: Independent Toileting: Independent In/Out Bed: Independent Walks in Home: Independent Does the patient have difficulty walking or climbing  stairs?: No Weakness of Legs: None Weakness of Arms/Hands: None  Permission Sought/Granted                  Emotional Assessment              Admission diagnosis:  CHB (complete heart block) (HCC) [I44.2] Patient Active Problem List   Diagnosis Date Noted   CHB (complete heart block) (HCC) 10/25/2022   Bradycardia 10/17/2022   Neck pain 08/15/2022   Cervical radiculopathy 08/13/2022   Hematuria 06/12/2022   Moderate mitral insufficiency 04/05/2022   Swelling of lower extremity 02/19/2022   Healthcare maintenance 02/19/2022   LBBB (left bundle branch block) 02/19/2022   Stable angina pectoris 02/19/2022   SOB (shortness of breath) 02/15/2022   Low back pain 10/16/2021   Low back strain 07/23/2021   Ankle swelling 02/11/2021   Right knee pain 08/07/2020   Trigger finger 08/07/2020   Hyperglycemia 08/02/2020   Numbness of left hand 06/19/2020   Mild depression 10/31/2019   CKD (chronic kidney disease) stage 3, GFR 30-59 ml/min (HCC) 12/17/2018   Total knee replacement status 11/24/2018   Bilateral cataracts 10/15/2018   Situational anxiety 09/08/2018   Primary osteoarthritis of left knee 08/03/2018   Hyperlipidemia 03/10/2018   Essential hypertension 03/10/2018   Carotid artery disease (HCC) 03/10/2018   Status post total right knee replacement 02/05/2016  Borderline diabetes mellitus 01/05/2016   History of depression 04/27/2015   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   Adiposity 04/27/2015   Sleep apnea 04/27/2015   Urinary retention 04/27/2015   PCP:  Dale Eek, MD Pharmacy:   [facility] [phone] - [place], [place] - [address] AT [facility] OF [address] [place] [address] [place] [address] Phone: [phone] Fax: [phone]  [facility] #[id number] [place], [place] - [address] AT [facility] OF [address] [place] [address] Phone: [phone] Fax:  [phone]     Social Determinants of Health (SDOH) Social History: SDOH Screenings   Food Insecurity: No Food Insecurity (10/25/2022)  Housing: Low Risk  (10/25/2022)  Transportation Needs: No Transportation Needs (10/25/2022)  Utilities: Not At Risk (10/25/2022)  Depression (PHQ2-9): Low Risk  (08/16/2022)  Tobacco Use: Medium Risk (10/26/2022)   SDOH Interventions:     Readmission Risk Interventions     No data to display

## 2022-10-30 ENCOUNTER — Telehealth: Payer: Self-pay | Admitting: Internal Medicine

## 2022-10-30 NOTE — Telephone Encounter (Signed)
Pt would like blood work before her 2/27 appointment

## 2022-11-01 DIAGNOSIS — R001 Bradycardia, unspecified: Secondary | ICD-10-CM | POA: Diagnosis not present

## 2022-11-01 DIAGNOSIS — I6529 Occlusion and stenosis of unspecified carotid artery: Secondary | ICD-10-CM | POA: Diagnosis not present

## 2022-11-01 DIAGNOSIS — G4733 Obstructive sleep apnea (adult) (pediatric): Secondary | ICD-10-CM | POA: Diagnosis not present

## 2022-11-01 DIAGNOSIS — E7800 Pure hypercholesterolemia, unspecified: Secondary | ICD-10-CM | POA: Diagnosis not present

## 2022-11-01 DIAGNOSIS — I340 Nonrheumatic mitral (valve) insufficiency: Secondary | ICD-10-CM | POA: Diagnosis not present

## 2022-11-01 DIAGNOSIS — I10 Essential (primary) hypertension: Secondary | ICD-10-CM | POA: Diagnosis not present

## 2022-11-01 DIAGNOSIS — I447 Left bundle-branch block, unspecified: Secondary | ICD-10-CM | POA: Diagnosis not present

## 2022-11-01 DIAGNOSIS — I442 Atrioventricular block, complete: Secondary | ICD-10-CM | POA: Diagnosis not present

## 2022-11-01 NOTE — Telephone Encounter (Signed)
Notify - cholesterol levels look good.  Overall sugar control improved.  Kidney function stable.  Liver function tests wnl.

## 2022-11-01 NOTE — Telephone Encounter (Signed)
Patient is not requesting blood work prior to appointment. She had labs done on 10/12/22 and had to reschedule her appointment with you. Please review her labs so I can call her with results.

## 2022-11-01 NOTE — Telephone Encounter (Signed)
Lm for pt to cb.

## 2022-11-01 NOTE — Telephone Encounter (Signed)
Patient aware of results.

## 2022-11-13 ENCOUNTER — Ambulatory Visit
Admission: EM | Admit: 2022-11-13 | Discharge: 2022-11-13 | Disposition: A | Discharge status: Home / Self Care | Payer: Medicare Other | Attending: Emergency Medicine | Admitting: Emergency Medicine

## 2022-11-13 DIAGNOSIS — R051 Acute cough: Secondary | ICD-10-CM

## 2022-11-13 DIAGNOSIS — R0982 Postnasal drip: Secondary | ICD-10-CM

## 2022-11-13 DIAGNOSIS — J209 Acute bronchitis, unspecified: Secondary | ICD-10-CM | POA: Diagnosis not present

## 2022-11-13 MED ORDER — AZITHROMYCIN 250 MG PO TABS: 250.0000 mg | ORAL_TABLET | Freq: Every day | ORAL | 0 refills | Status: DC

## 2022-11-13 NOTE — ED Provider Notes (Signed)
Roderic Palau    CSN: [id number] Arrival date & time: 11/13/22  1050      History   Chief Complaint Chief Complaint  Patient presents with   Cough    HPI Elyna Zayli Villafuerte is a 82 y.o. female.  Patient presents with 2-3 day history of postnasal drip, itchy nose, itchy throat, nonproductive cough.  She denies fever, chest pain, shortness of breath, or other symptoms.  Treatment with Flonase nasal spray.  Her medical history includes third-degree AV block, pacemaker, hypertension, CAD, CKD, hyperlipidemia, diabetes, osteoarthritis, hard of hearing.  Pacemaker placed on 10/25/2022.    The history is provided by the patient and medical records.    Past Medical History:  Diagnosis Date   Arthritis    Bronchitis    Coronary artery disease    Depression    HOH (hard of hearing)    AIDS   Hypertension    Sleep apnea    CPAP    Patient Active Problem List   Diagnosis Date Noted   CHB (complete heart block) ([place]) 10/25/2022   Bradycardia 10/17/2022   Neck pain 08/15/2022   Cervical radiculopathy 08/13/2022   Hematuria 06/12/2022   Moderate mitral insufficiency 04/05/2022   Swelling of lower extremity 02/19/2022   Healthcare maintenance 02/19/2022   LBBB (left bundle branch block) 02/19/2022   Stable angina pectoris 02/19/2022   SOB (shortness of breath) 02/15/2022   Low back pain 10/16/2021   Low back strain 07/23/2021   Ankle swelling 02/11/2021   Right knee pain 08/07/2020   Trigger finger 08/07/2020   Hyperglycemia 08/02/2020   Numbness of left hand 06/19/2020   Mild depression 10/31/2019   CKD (chronic kidney disease) stage 3, GFR 30-59 ml/min (HCC) 12/17/2018   Total knee replacement status 11/24/2018   Bilateral cataracts 10/15/2018   Situational anxiety 09/08/2018   Primary osteoarthritis of left knee 08/03/2018   Hyperlipidemia 03/10/2018   Essential hypertension 03/10/2018   Carotid artery disease ([place]) 03/10/2018   Status post total right knee  replacement 02/05/2016   Borderline diabetes mellitus 01/05/2016   History of depression 04/27/2015   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   Adiposity 04/27/2015   Sleep apnea 04/27/2015   Urinary retention 04/27/2015    Past Surgical History:  Procedure Laterality Date   ABDOMINAL HYSTERECTOMY     APPENDECTOMY     BACK SURGERY     X 3   CATARACT EXTRACTION W/ INTRAOCULAR LENS IMPLANT Right 2017   CATARACT EXTRACTION W/PHACO Left 08/08/2016   Procedure: CATARACT EXTRACTION PHACO AND INTRAOCULAR LENS PLACEMENT ([place]);  Surgeon: Estill Cotta, MD;  Location: [facility];  Service: Ophthalmology;  Laterality: Left;  fluid lot # [id number]  exp02/28/2019 [place]    01:15.3 AP%   25.2 CDE   35.06    CTR Right    FOOT SURGERY     JOINT REPLACEMENT     TKR   KNEE ARTHROPLASTY Left 11/24/2018   Procedure: COMPUTER ASSISTED TOTAL KNEE ARTHROPLASTY-LEFT;  Surgeon: Dereck Leep, MD;  Location: [facility];  Service: Orthopedics;  Laterality: Left;   NOSE SURGERY     PACEMAKER INSERTION     PACEMAKER LEADLESS INSERTION  10/25/2022   PACEMAKER LEADLESS INSERTION N/A 10/25/2022   Procedure: PACEMAKER LEADLESS INSERTION;  Surgeon: Isaias Cowman, MD;  Location: [facility];  Service: Cardiovascular;  Laterality: N/A;   RCR Left    SHOULDER ARTHROSCOPY Right    for bursitis   SPINE SURGERY  1973, 1978,  2016   Most recent Laminectomy to T11 and L3    OB History   No obstetric history on file.      Home Medications    Prior to Admission medications   Medication Sig Start Date End Date Taking? Authorizing Provider  azithromycin (ZITHROMAX) 250 MG tablet Take 1 tablet (250 mg total) by mouth daily. Take first 2 tablets together, then 1 every day until finished. 11/13/22  Yes Sharion Balloon, NP  acetaminophen (TYLENOL) 500 MG tablet Take 500 mg by mouth every 6 (six) hours as needed.    [provider]  amLODipine (NORVASC) 5 MG tablet Take 1 tablet (5 mg  total) by mouth 2 (two) times daily. 08/16/22   Einar Pheasant, MD  aspirin 81 MG EC tablet Take 81 mg by mouth daily.    [provider]  atorvastatin (LIPITOR) 40 MG tablet TAKE 1 TABLET(40 MG) BY MOUTH AT BEDTIME Patient taking differently: Take 40 mg by mouth at bedtime. TAKE 1 TABLET(40 MG) BY MOUTH AT BEDTIME 07/09/22   Einar Pheasant, MD  Coenzyme Q10 (COQ10) 200 MG CAPS Take 1 capsule by mouth daily.    [provider]  Doxylamine Succinate, Sleep, (SLEEP AID PO) Take 1 tablet by mouth at bedtime as needed (sleep).    [provider]  FLUoxetine (PROZAC) 40 MG capsule TAKE 1 CAPSULE(40 MG) BY MOUTH DAILY Patient taking differently: Take 40 mg by mouth daily. 05/09/22   Dutch Quint B, FNP  hydrochlorothiazide (HYDRODIURIL) 25 MG tablet TAKE 1 TABLET(25 MG) BY MOUTH EVERY EVENING 10/01/22   Dutch Quint B, FNP  ketoconazole (NIZORAL) 2 % cream Apply topically. 02/02/21   [provider]  losartan (COZAAR) 100 MG tablet TAKE 1 TABLET(100 MG) BY MOUTH DAILY 04/16/22   Einar Pheasant, MD  Multiple Vitamins-Minerals (CENTRUM SILVER PO) Take 1 tablet by mouth daily.    [provider]  nystatin cream (MYCOSTATIN) Apply 1 application topically 2 (two) times daily. 04/27/20   Einar Pheasant, MD  Polyethylene Glycol 3350 (MIRALAX PO) Take 17 g by mouth daily as needed (constipation).    [provider]  senna (SENOKOT) 8.6 MG tablet Take 1 tablet by mouth daily.    [provider]  traZODone (DESYREL) 50 MG tablet 1/2 tablet q hs prn 06/09/20   Einar Pheasant, MD    Family History Family History  Problem Relation Age of Onset   Congestive Heart Failure Mother    Diabetes Father    Congestive Heart Failure Father    Congestive Heart Failure Sister    Congestive Heart Failure Brother     Social History Social History   Tobacco Use   Smoking status: Former    Types: Cigarettes    Quit date: 10/08/1960    Years since quitting:  62.1   Smokeless tobacco: Never  Vaping Use   Vaping Use: Never used  Substance Use Topics   Alcohol use: Yes    Comment: 2 cocktails   Drug use: Never     Allergies   Influenza virus vaccine, Penicillins, Egg white [egg white (egg protein)], Lisinopril, and Haemophilus b polysaccharide vaccine   Review of Systems Review of Systems  Constitutional:  Negative for chills and fever.  HENT:  Positive for postnasal drip. Negative for ear pain, sore throat and trouble swallowing.   Respiratory:  Positive for cough. Negative for shortness of breath.   Cardiovascular:  Negative for chest pain and palpitations.  Gastrointestinal:  Negative for diarrhea and  vomiting.  Skin:  Negative for color change and rash.  All other systems reviewed and are negative.    Physical Exam Triage Vital Signs ED Triage Vitals  Enc Vitals Group     BP 11/13/22 1114 118/73     Pulse Rate 11/13/22 1107 70     Resp 11/13/22 1107 18     Temp 11/13/22 1107 98.5 F (36.9 C)     Temp src --      SpO2 11/13/22 1107 97 %     Weight --      Height --      Head Circumference --      Peak Flow --      Pain Score 11/13/22 1109 0     Pain Loc --      Pain Edu? --      Excl. in [place]? --    No data found.  Updated Vital Signs BP 118/73   Pulse 70   Temp 98.5 F (36.9 C)   Resp 18   SpO2 97%   Visual Acuity Right Eye Distance:   Left Eye Distance:   Bilateral Distance:    Right Eye Near:   Left Eye Near:    Bilateral Near:     Physical Exam Vitals and nursing note reviewed.  Constitutional:      General: She is not in acute distress.    Appearance: She is well-developed. She is not ill-appearing.  HENT:     Right Ear: Tympanic membrane normal.     Left Ear: Tympanic membrane normal.     Nose: Rhinorrhea present.     Mouth/Throat:     Mouth: Mucous membranes are moist.     Pharynx: Oropharynx is clear.     Comments: PND. Cardiovascular:     Rate and Rhythm: Normal rate and regular  rhythm.     Heart sounds: Normal heart sounds.  Pulmonary:     Effort: Pulmonary effort is normal. No respiratory distress.     Breath sounds: Normal breath sounds.  Musculoskeletal:     Cervical back: Neck supple.  Skin:    General: Skin is warm and dry.  Neurological:     Mental Status: She is alert.  Psychiatric:        Mood and Affect: Mood normal.        Behavior: Behavior normal.      UC Treatments / Results  Labs (all labs ordered are listed, but only abnormal results are displayed) Labs Reviewed - No data to display  EKG   Radiology No results found.  Procedures Procedures (including critical care time)  Medications Ordered in UC Medications - No data to display  Initial Impression / Assessment and Plan / UC Course  I have reviewed the triage vital signs and the nursing notes.  Pertinent labs & imaging results that were available during my care of the patient were reviewed by me and considered in my medical decision making (see chart for details).    Acute bronchitis, postnasal drip, cough.  Based on patient's age, recent surgery (cardiac pacemaker), and history of bronchitis, treating with Zithromax which she reports has worked well for previous episodes of bronchitis.  Instructed patient to follow up with her PCP.  Education provided on bronchitis.  She agrees to plan of care.    Final Clinical Impressions(s) / UC Diagnoses   Final diagnoses:  Acute bronchitis, unspecified organism  Postnasal drip  Acute cough     Discharge Instructions  Take the Zithromax as directed.  Follow up with your primary care provider.        ED Prescriptions     Medication Sig Dispense Auth. Provider   azithromycin (ZITHROMAX) 250 MG tablet Take 1 tablet (250 mg total) by mouth daily. Take first 2 tablets together, then 1 every day until finished. 6 tablet Sharion Balloon, NP      PDMP not reviewed this encounter.   Sharion Balloon, NP 11/13/22 1137

## 2022-11-13 NOTE — ED Triage Notes (Signed)
Patient to Urgent Care with complaints of cough/ itchy nose and throat. Reports cough is "rattling", dry at this time. Denies any known fevers.  Symptoms started two days ago. Hx of bronchitis. Using afrin nasal spray.  Pacemaker placed two weeks ago.

## 2022-11-13 NOTE — Discharge Instructions (Addendum)
Take the Zithromax as directed.  Follow up with your primary care provider.    

## 2022-11-28 ENCOUNTER — Telehealth: Payer: Self-pay

## 2022-11-28 ENCOUNTER — Other Ambulatory Visit: Payer: Self-pay

## 2022-11-28 MED ORDER — LOSARTAN POTASSIUM 100 MG PO TABS: ORAL_TABLET | ORAL | 1 refills | Status: DC

## 2022-11-28 MED ORDER — AMLODIPINE BESYLATE 5 MG PO TABS: 5.0000 mg | ORAL_TABLET | Freq: Two times a day (BID) | ORAL | 1 refills | Status: DC

## 2022-11-28 NOTE — Telephone Encounter (Signed)
sent

## 2022-11-28 NOTE — Telephone Encounter (Signed)
Prescription Request  11/28/2022  Is this a "Controlled Substance" medicine? No  LOV: Visit date not found  What is the name of the medication or equipment? amLODipine (NORVASC) 5 MG tablet and losartan (COZAAR) 100 MG tablet  Have you contacted your pharmacy to request a refill? Yes   Which pharmacy would you like this sent to?  [facility] [id number], [place] AT [facility] OF [address] [place] [address] Phone: [phone] Fax: [phone]    Patient notified that their request is being sent to the clinical staff for review and that they should receive a response within 2 business days.   Please advise at Mobile [phone] (mobile)   Patient states she has two amlodipine prescriptions.  Patient states one is a coated tablet and the coated tablet is the one she needs refilled.  Patient states she has one of each medication left (amlodipine non-coated and losartan).  Patient states they filled her non-coated amlodipine.

## 2022-11-30 ENCOUNTER — Ambulatory Visit: Payer: Medicare Other | Admitting: Internal Medicine

## 2022-12-02 ENCOUNTER — Telehealth: Payer: Self-pay | Admitting: Family

## 2022-12-04 ENCOUNTER — Ambulatory Visit: Payer: Medicare Other | Admitting: Internal Medicine

## 2022-12-05 ENCOUNTER — Other Ambulatory Visit: Payer: Self-pay

## 2022-12-05 MED ORDER — FLUOXETINE HCL 40 MG PO CAPS: 40.0000 mg | ORAL_CAPSULE | Freq: Every day | ORAL | 1 refills | Status: DC

## 2022-12-05 NOTE — Telephone Encounter (Signed)
Pt need a refill on FLUoxetine sent to [facility]

## 2022-12-05 NOTE — Telephone Encounter (Signed)
sent 

## 2022-12-14 ENCOUNTER — Encounter: Payer: Self-pay | Admitting: Internal Medicine

## 2022-12-14 ENCOUNTER — Ambulatory Visit (INDEPENDENT_AMBULATORY_CARE_PROVIDER_SITE_OTHER): Payer: Medicare Other | Admitting: Internal Medicine

## 2022-12-14 VITALS — BP 128/72 | HR 77 | Temp 98.0°F | Resp 16 | Ht 67.5 in | Wt 243.0 lb

## 2022-12-14 DIAGNOSIS — I779 Disorder of arteries and arterioles, unspecified: Secondary | ICD-10-CM | POA: Diagnosis not present

## 2022-12-14 DIAGNOSIS — R739 Hyperglycemia, unspecified: Secondary | ICD-10-CM | POA: Diagnosis not present

## 2022-12-14 DIAGNOSIS — R200 Anesthesia of skin: Secondary | ICD-10-CM | POA: Diagnosis not present

## 2022-12-14 DIAGNOSIS — N1832 Chronic kidney disease, stage 3b: Secondary | ICD-10-CM

## 2022-12-14 DIAGNOSIS — E785 Hyperlipidemia, unspecified: Secondary | ICD-10-CM | POA: Diagnosis not present

## 2022-12-14 DIAGNOSIS — G4730 Sleep apnea, unspecified: Secondary | ICD-10-CM | POA: Diagnosis not present

## 2022-12-14 DIAGNOSIS — I10 Essential (primary) hypertension: Secondary | ICD-10-CM | POA: Diagnosis not present

## 2022-12-14 DIAGNOSIS — Z8679 Personal history of other diseases of the circulatory system: Secondary | ICD-10-CM | POA: Diagnosis not present

## 2022-12-14 NOTE — Progress Notes (Signed)
Subjective:    Patient ID: Amber Bridges, female    DOB: 07/28/1941, 82 y.o.   MRN: [id number]  Patient here for  Chief Complaint  Patient presents with   Medical Management of Chronic Issues    HPI Here to follow up regarding hypercholesterolemia, hypertension and hyperglycemia.  Last visit, experiencing sob, etc.  Found to be bradycardic on exam.  Amlodipine increased to 5mg  bid. Saw cardiology. Echocardiogram showed mild LV systolic dysfunction EF 50% likely due to left bundle branch block, normal RV systolic function, moderate valvular regurgitation, no valvular stenosis. Lexiscan showed minimal fixed anterior myocardial perfusion defect consistent with possible previous infarct but more likely breast attenuation without evidence of myocardial ischemia. Holter monitor showing very frequent episodes of second-degree type I AV block, frequent episodes of 2-1 block with heart rate of 40 bpm, minimum heart rate of 35 bpm more consistent with more advanced third-degree heart block.  Is s/p micra pacer insertion 10/25/22. Since her pacemaker placement, she feels better.  Energy better.  Walking.  Overall feels better.  No increased sob.  No chest pain reported.  Saw [facility] - f/u carotid artery disease - Duplex ultrasound shows <40% stenosis bilaterally. Evaluated 11/13/22 - urgent care - diagnosed with bronchitis.  Treated with zpak. Feeling better.  She does report increased pain - left hand.  Discussed carpal tunnel.  Worsening symptoms.  Request referral to ortho.   Past Medical History:  Diagnosis Date   Arthritis    Bronchitis    Coronary artery disease    Depression    HOH (hard of hearing)    AIDS   Hypertension    Sleep apnea    CPAP   Past Surgical History:  Procedure Laterality Date   ABDOMINAL HYSTERECTOMY     APPENDECTOMY     BACK SURGERY     X 3   CATARACT EXTRACTION W/ INTRAOCULAR LENS IMPLANT Right 2017   CATARACT EXTRACTION W/PHACO Left 08/08/2016   Procedure:  CATARACT EXTRACTION PHACO AND INTRAOCULAR LENS PLACEMENT ([facility]);  Surgeon: Sallee Lange, MD;  Location: [facility];  Service: Ophthalmology;  Laterality: Left;  fluid lot # [id number]  exp02/28/2019 [place]    01:15.3 AP%   25.2 CDE   35.06    CTR Right    FOOT SURGERY     JOINT REPLACEMENT     TKR   KNEE ARTHROPLASTY Left 11/24/2018   Procedure: COMPUTER ASSISTED TOTAL KNEE ARTHROPLASTY-LEFT;  Surgeon: Donato Heinz, MD;  Location: [facility];  Service: Orthopedics;  Laterality: Left;   NOSE SURGERY     PACEMAKER INSERTION     PACEMAKER LEADLESS INSERTION  10/25/2022   PACEMAKER LEADLESS INSERTION N/A 10/25/2022   Procedure: PACEMAKER LEADLESS INSERTION;  Surgeon: Marcina Millard, MD;  Location: [facility];  Service: Cardiovascular;  Laterality: N/A;   RCR Left    SHOULDER ARTHROSCOPY Right    for bursitis   SPINE SURGERY  1973, 1978, 2016   Most recent Laminectomy to T11 and L3   Family History  Problem Relation Age of Onset   Congestive Heart Failure Mother    Diabetes Father    Congestive Heart Failure Father    Congestive Heart Failure Sister    Congestive Heart Failure Brother    Social History   Socioeconomic History   Marital status: Widowed    Spouse name: Not on file   Number of children: Not on file   Years of education: Not on file   Highest education  level: Not on file  Occupational History   Not on file  Tobacco Use   Smoking status: Former    Types: Cigarettes    Quit date: 10/08/1960    Years since quitting: 62.2   Smokeless tobacco: Never  Vaping Use   Vaping Use: Never used  Substance and Sexual Activity   Alcohol use: Yes    Comment: 2 cocktails   Drug use: Never   Sexual activity: Not on file  Other Topics Concern   Not on file  Social History Narrative   Not on file   Social Determinants of Health   Financial Resource Strain: Not on file  Food Insecurity: No Food Insecurity (10/25/2022)   Hunger Vital Sign    Worried About  Running Out of Food in the Last Year: Never true    Ran Out of Food in the Last Year: Never true  Transportation Needs: No Transportation Needs (10/25/2022)   PRAPARE - Administrator, Civil Service (Medical): No    Lack of Transportation (Non-Medical): No  Physical Activity: Not on file  Stress: Not on file  Social Connections: Not on file     Review of Systems  Constitutional:  Negative for appetite change and unexpected weight change.  HENT:  Negative for congestion and sinus pressure.   Respiratory:  Negative for cough, chest tightness and shortness of breath.   Cardiovascular:  Negative for chest pain and palpitations.  Gastrointestinal:  Negative for abdominal pain, diarrhea, nausea and vomiting.  Genitourinary:  Negative for difficulty urinating and dysuria.  Musculoskeletal:  Negative for joint swelling and myalgias.  Skin:  Negative for color change and rash.  Neurological:  Negative for dizziness and headaches.  Psychiatric/Behavioral:  Negative for agitation and dysphoric mood.        Objective:     BP 128/72   Pulse 77   Temp 98 F (36.7 C)   Resp 16   Ht 5' 7.5" (1.715 m)   Wt 243 lb (110.2 kg)   SpO2 98%   BMI 37.50 kg/m  Wt Readings from Last 3 Encounters:  12/14/22 243 lb (110.2 kg)  10/25/22 240 lb (108.9 kg)  09/10/22 249 lb (112.9 kg)    Physical Exam Vitals reviewed.  Constitutional:      General: She is not in acute distress.    Appearance: Normal appearance.  HENT:     Head: Normocephalic and atraumatic.     Right Ear: External ear normal.     Left Ear: External ear normal.  Eyes:     General: No scleral icterus.       Right eye: No discharge.        Left eye: No discharge.     Conjunctiva/sclera: Conjunctivae normal.  Neck:     Thyroid: No thyromegaly.  Cardiovascular:     Rate and Rhythm: Normal rate and regular rhythm.  Pulmonary:     Effort: No respiratory distress.     Breath sounds: Normal breath sounds. No  wheezing.  Abdominal:     General: Bowel sounds are normal.     Palpations: Abdomen is soft.     Tenderness: There is no abdominal tenderness.  Musculoskeletal:        General: No swelling or tenderness.     Cervical back: Neck supple. No tenderness.  Lymphadenopathy:     Cervical: No cervical adenopathy.  Skin:    Findings: No erythema or rash.  Neurological:     Mental Status: She  is alert.  Psychiatric:        Mood and Affect: Mood normal.        Behavior: Behavior normal.      Outpatient Encounter Medications as of 12/14/2022  Medication Sig   acetaminophen (TYLENOL) 500 MG tablet Take 500 mg by mouth every 6 (six) hours as needed.   amLODipine (NORVASC) 5 MG tablet Take 1 tablet (5 mg total) by mouth 2 (two) times daily.   aspirin 81 MG EC tablet Take 81 mg by mouth daily.   atorvastatin (LIPITOR) 40 MG tablet TAKE 1 TABLET(40 MG) BY MOUTH AT BEDTIME (Patient taking differently: Take 40 mg by mouth at bedtime. TAKE 1 TABLET(40 MG) BY MOUTH AT BEDTIME)   azithromycin (ZITHROMAX) 250 MG tablet Take 1 tablet (250 mg total) by mouth daily. Take first 2 tablets together, then 1 every day until finished.   Coenzyme Q10 (COQ10) 200 MG CAPS Take 1 capsule by mouth daily.   Doxylamine Succinate, Sleep, (SLEEP AID PO) Take 1 tablet by mouth at bedtime as needed (sleep).   FLUoxetine (PROZAC) 40 MG capsule Take 1 capsule (40 mg total) by mouth daily.   hydrochlorothiazide (HYDRODIURIL) 25 MG tablet TAKE 1 TABLET(25 MG) BY MOUTH EVERY EVENING   ketoconazole (NIZORAL) 2 % cream Apply topically.   losartan (COZAAR) 100 MG tablet TAKE 1 TABLET(100 MG) BY MOUTH DAILY   Multiple Vitamins-Minerals (CENTRUM SILVER PO) Take 1 tablet by mouth daily.   nystatin cream (MYCOSTATIN) Apply 1 application topically 2 (two) times daily.   Polyethylene Glycol 3350 (MIRALAX PO) Take 17 g by mouth daily as needed (constipation).   senna (SENOKOT) 8.6 MG tablet Take 1 tablet by mouth daily.   traZODone  (DESYREL) 50 MG tablet 1/2 tablet q hs prn   No facility-administered encounter medications on file as of 12/14/2022.     Lab Results  Component Value Date   WBC 7.3 08/16/2022   HGB 12.3 08/16/2022   HCT 37.1 08/16/2022   PLT 241.0 08/16/2022   GLUCOSE 108 (H) 10/26/2022   CHOL 143 10/12/2022   TRIG 80.0 10/12/2022   HDL 66.60 10/12/2022   LDLCALC 60 10/12/2022   ALT 15 10/12/2022   AST 16 10/12/2022   NA 137 10/26/2022   K 4.0 10/26/2022   CL 108 10/26/2022   CREATININE 1.19 (H) 10/26/2022   BUN 24 (H) 10/26/2022   CO2 23 10/26/2022   TSH 1.39 06/07/2022   INR 0.96 11/12/2018   HGBA1C 5.9 10/12/2022    No results found.     Assessment & Plan:  Bilateral carotid artery disease, unspecified type [facility]) Assessment & Plan: 09/2022 - carotid ultrasound - <40% stenosis - f/u 12 months.    Stage 3b chronic kidney disease (HCC) Assessment & Plan: Avoid antiinflammatories.  Continue losartan.  Follow metabolic panel.   Orders: -     Basic metabolic panel; Future  Essential hypertension Assessment & Plan: Blood pressure doing well.  Blood pressure 128/64.  Doing well on losartan, hctz and amlodipine.  She has been taking three amlodipine per day.  Decrease to bid. Follow pressures.  Follow metabolic panel.     Hyperglycemia Assessment & Plan: Low carb diet and exercise.  Follow met b and a1c.   Orders: -     Hemoglobin A1c; Future  Hyperlipidemia, unspecified hyperlipidemia type Assessment & Plan: Continue lipitor.  Low cholesterol diet and exercise.  Follow lipid panel and liver function tests.   Orders: -     Lipid  panel; Future -     Hepatic function panel; Future  Numbness of left hand Assessment & Plan: Numbness and pain - left hand.  Discussed CTS.  Given persistent increased pain and symptoms, request referral to ortho for further evaluation and treatment.  Has tried splints.   Orders: -     Ambulatory referral to Orthopedic Surgery  Sleep apnea,  unspecified type Assessment & Plan: CPAP   History of complete heart block Assessment & Plan: Is s/p micra pacer insertion 10/25/22. Since her pacemaker placement, she feels better.  Energy better.  Walking.  Overall feels better.  No increased sob.      Dale Millard, MD

## 2022-12-14 NOTE — Patient Instructions (Signed)
Take amlodipine one tablet twice a day.

## 2022-12-21 ENCOUNTER — Telehealth: Payer: Self-pay

## 2022-12-21 ENCOUNTER — Encounter: Payer: Self-pay | Admitting: Internal Medicine

## 2022-12-21 NOTE — Telephone Encounter (Signed)
Okay to place referral or call patient and advise of walk in hours?

## 2022-12-21 NOTE — Assessment & Plan Note (Addendum)
Blood pressure doing well.  Blood pressure 128/64.  Doing well on losartan, hctz and amlodipine.  She has been taking three amlodipine per day.  Decrease to bid. Follow pressures.  Follow metabolic panel.

## 2022-12-21 NOTE — Assessment & Plan Note (Signed)
Is s/p micra pacer insertion 10/25/22. Since her pacemaker placement, she feels better.  Energy better.  Walking.  Overall feels better.  No increased sob.

## 2022-12-21 NOTE — Assessment & Plan Note (Signed)
09/2022 - carotid ultrasound - <40% stenosis - f/u 12 months.

## 2022-12-21 NOTE — Assessment & Plan Note (Signed)
Avoid antiinflammatories.  Continue losartan.  Follow metabolic panel.  

## 2022-12-21 NOTE — Assessment & Plan Note (Signed)
Low carb diet and exercise.  Follow met b and a1c.   

## 2022-12-21 NOTE — Telephone Encounter (Signed)
Patient notified

## 2022-12-21 NOTE — Telephone Encounter (Signed)
Referral has been placed to [facility] - Dr Peggye Ley.  Someone should be contacting her with an appt.

## 2022-12-21 NOTE — Assessment & Plan Note (Signed)
Continue lipitor.  Low cholesterol diet and exercise.  Follow lipid panel and liver function tests.   

## 2022-12-21 NOTE — Assessment & Plan Note (Signed)
CPAP.  

## 2022-12-21 NOTE — Telephone Encounter (Signed)
Patient states she met with Dr. Einar Pheasant recently and she was going to refer her to [facility] for hand surgery.  Patient states they have not received a referral from [place] yet.

## 2022-12-21 NOTE — Assessment & Plan Note (Signed)
Numbness and pain - left hand.  Discussed CTS.  Given persistent increased pain and symptoms, request referral to ortho for further evaluation and treatment.  Has tried splints.

## 2023-01-23 DIAGNOSIS — D485 Neoplasm of uncertain behavior of skin: Secondary | ICD-10-CM | POA: Diagnosis not present

## 2023-01-23 DIAGNOSIS — L570 Actinic keratosis: Secondary | ICD-10-CM | POA: Diagnosis not present

## 2023-01-23 DIAGNOSIS — L218 Other seborrheic dermatitis: Secondary | ICD-10-CM | POA: Diagnosis not present

## 2023-01-23 DIAGNOSIS — Z859 Personal history of malignant neoplasm, unspecified: Secondary | ICD-10-CM | POA: Diagnosis not present

## 2023-01-23 DIAGNOSIS — L2089 Other atopic dermatitis: Secondary | ICD-10-CM | POA: Diagnosis not present

## 2023-01-23 DIAGNOSIS — L578 Other skin changes due to chronic exposure to nonionizing radiation: Secondary | ICD-10-CM | POA: Diagnosis not present

## 2023-01-23 DIAGNOSIS — Z872 Personal history of diseases of the skin and subcutaneous tissue: Secondary | ICD-10-CM | POA: Diagnosis not present

## 2023-01-23 DIAGNOSIS — L821 Other seborrheic keratosis: Secondary | ICD-10-CM | POA: Diagnosis not present

## 2023-01-23 DIAGNOSIS — D0439 Carcinoma in situ of skin of other parts of face: Secondary | ICD-10-CM | POA: Diagnosis not present

## 2023-01-23 DIAGNOSIS — Z85828 Personal history of other malignant neoplasm of skin: Secondary | ICD-10-CM | POA: Diagnosis not present

## 2023-01-29 DIAGNOSIS — M65842 Other synovitis and tenosynovitis, left hand: Secondary | ICD-10-CM | POA: Diagnosis not present

## 2023-01-29 DIAGNOSIS — M67432 Ganglion, left wrist: Secondary | ICD-10-CM | POA: Diagnosis not present

## 2023-01-29 DIAGNOSIS — G5602 Carpal tunnel syndrome, left upper limb: Secondary | ICD-10-CM | POA: Diagnosis not present

## 2023-01-31 ENCOUNTER — Encounter: Payer: Self-pay | Admitting: Internal Medicine

## 2023-01-31 DIAGNOSIS — G5600 Carpal tunnel syndrome, unspecified upper limb: Secondary | ICD-10-CM | POA: Insufficient documentation

## 2023-02-04 ENCOUNTER — Other Ambulatory Visit (HOSPITAL_COMMUNITY): Payer: Self-pay | Admitting: Orthopaedic Surgery

## 2023-02-04 DIAGNOSIS — M67432 Ganglion, left wrist: Secondary | ICD-10-CM

## 2023-02-08 DIAGNOSIS — D0439 Carcinoma in situ of skin of other parts of face: Secondary | ICD-10-CM | POA: Diagnosis not present

## 2023-02-08 DIAGNOSIS — C44329 Squamous cell carcinoma of skin of other parts of face: Secondary | ICD-10-CM | POA: Diagnosis not present

## 2023-02-11 ENCOUNTER — Other Ambulatory Visit (INDEPENDENT_AMBULATORY_CARE_PROVIDER_SITE_OTHER): Payer: Medicare Other

## 2023-02-11 DIAGNOSIS — E785 Hyperlipidemia, unspecified: Secondary | ICD-10-CM

## 2023-02-11 DIAGNOSIS — N1832 Chronic kidney disease, stage 3b: Secondary | ICD-10-CM | POA: Diagnosis not present

## 2023-02-11 DIAGNOSIS — R739 Hyperglycemia, unspecified: Secondary | ICD-10-CM | POA: Diagnosis not present

## 2023-02-11 LAB — HEPATIC FUNCTION PANEL
ALT: 14 U/L (ref 0–35)
AST: 14 U/L (ref 0–37)
Albumin: 4.2 g/dL (ref 3.5–5.2)
Alkaline Phosphatase: 70 U/L (ref 39–117)
Bilirubin, Direct: 0.2 mg/dL (ref 0.0–0.3)
Total Bilirubin: 0.6 mg/dL (ref 0.2–1.2)
Total Protein: 6.7 g/dL (ref 6.0–8.3)

## 2023-02-11 LAB — LIPID PANEL
Cholesterol: 154 mg/dL (ref 0–200)
HDL: 64.3 mg/dL (ref >39.00)
LDL Cholesterol: 70 mg/dL (ref 0–99)
NonHDL: 89.22
Total CHOL/HDL Ratio: 2
Triglycerides: 97 mg/dL (ref 0.0–149.0)
VLDL: 19.4 mg/dL (ref 0.0–40.0)

## 2023-02-11 LAB — BASIC METABOLIC PANEL
BUN: 16 mg/dL (ref 6–23)
CO2: 27 mEq/L (ref 19–32)
Calcium: 9.2 mg/dL (ref 8.4–10.5)
Chloride: 104 mEq/L (ref 96–112)
Creatinine, Ser: 1.23 mg/dL — ABNORMAL HIGH (ref 0.40–1.20)
GFR: 41.07 mL/min — ABNORMAL LOW (ref >60.00)
Glucose, Bld: 107 mg/dL — ABNORMAL HIGH (ref 70–99)
Potassium: 4.1 mEq/L (ref 3.5–5.1)
Sodium: 140 mEq/L (ref 135–145)

## 2023-02-11 LAB — HEMOGLOBIN A1C: Hgb A1c MFr Bld: 6.1 % (ref 4.6–6.5)

## 2023-02-13 ENCOUNTER — Encounter: Payer: Self-pay | Admitting: Internal Medicine

## 2023-02-13 ENCOUNTER — Ambulatory Visit (INDEPENDENT_AMBULATORY_CARE_PROVIDER_SITE_OTHER): Payer: Medicare Other | Admitting: Internal Medicine

## 2023-02-13 VITALS — BP 130/70 | HR 76 | Temp 97.9°F | Resp 16 | Ht 67.5 in | Wt 245.0 lb

## 2023-02-13 DIAGNOSIS — G4730 Sleep apnea, unspecified: Secondary | ICD-10-CM | POA: Diagnosis not present

## 2023-02-13 DIAGNOSIS — R739 Hyperglycemia, unspecified: Secondary | ICD-10-CM | POA: Diagnosis not present

## 2023-02-13 DIAGNOSIS — I779 Disorder of arteries and arterioles, unspecified: Secondary | ICD-10-CM | POA: Diagnosis not present

## 2023-02-13 DIAGNOSIS — E785 Hyperlipidemia, unspecified: Secondary | ICD-10-CM

## 2023-02-13 DIAGNOSIS — I340 Nonrheumatic mitral (valve) insufficiency: Secondary | ICD-10-CM | POA: Diagnosis not present

## 2023-02-13 DIAGNOSIS — F32A Depression, unspecified: Secondary | ICD-10-CM

## 2023-02-13 DIAGNOSIS — I10 Essential (primary) hypertension: Secondary | ICD-10-CM

## 2023-02-13 DIAGNOSIS — Z8679 Personal history of other diseases of the circulatory system: Secondary | ICD-10-CM

## 2023-02-13 DIAGNOSIS — G5600 Carpal tunnel syndrome, unspecified upper limb: Secondary | ICD-10-CM | POA: Diagnosis not present

## 2023-02-13 DIAGNOSIS — N1832 Chronic kidney disease, stage 3b: Secondary | ICD-10-CM | POA: Diagnosis not present

## 2023-02-13 MED ORDER — HYDROCHLOROTHIAZIDE 25 MG PO TABS: ORAL_TABLET | ORAL | 3 refills | Status: DC

## 2023-02-13 MED ORDER — LOSARTAN POTASSIUM 100 MG PO TABS: ORAL_TABLET | ORAL | 3 refills | Status: DC

## 2023-02-13 MED ORDER — ATORVASTATIN CALCIUM 40 MG PO TABS: 40.0000 mg | ORAL_TABLET | Freq: Every day | ORAL | 3 refills | Status: DC

## 2023-02-13 MED ORDER — AMLODIPINE BESYLATE 5 MG PO TABS: 5.0000 mg | ORAL_TABLET | Freq: Two times a day (BID) | ORAL | 3 refills | Status: DC

## 2023-02-13 MED ORDER — FLUOXETINE HCL 40 MG PO CAPS: 40.0000 mg | ORAL_CAPSULE | Freq: Every day | ORAL | 1 refills | Status: DC

## 2023-02-13 NOTE — Progress Notes (Signed)
Subjective:    Patient ID: Amber Bridges, female    DOB: September 27, 1941, 82 y.o.   MRN: [id number]  Patient here for  Chief Complaint  Patient presents with   Medical Management of Chronic Issues    HPI Here to follow up regarding hypercholesterolemia, hypertension and hyperglycemia. Recently underwent cardiac w/up - found to be bradycardic on exam.  Amlodipine increased to 5mg  bid. Saw cardiology. Echocardiogram showed mild LV systolic dysfunction EF 50% likely due to left bundle branch block, normal RV systolic function, moderate valvular regurgitation, no valvular stenosis. Lexiscan showed minimal fixed anterior myocardial perfusion defect consistent with possible previous infarct but more likely breast attenuation without evidence of myocardial ischemia. Holter monitor showing very frequent episodes of second-degree type I AV block, frequent episodes of 2-1 block with heart rate of 40 bpm, minimum heart rate of 35 bpm more consistent with more advanced third-degree heart block.  Is s/p micra pacer insertion 10/25/22. Since her pacemaker placement, she feels better.  Amlodipine was changed to bid last visit.  Also referred to ortho for left hand pain and numbness.  Evaluated 01/29/23 - left carpal tunnel and left middle finger stenosing tenosynovitis.  S/p injections. Scheduled for MRI - wrist 04/02/23.  She has noticed no increased swelling.  Some improvement in sensation.  No chest pain.  Breathing stable.  Feels much better.  No increased sob.  No cough or congestion.  No abdominal pain or bowel change noted.  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  Staples.  Plans to f/u with dermatology 02/15/23 - staples to be removed. Walking.     Past Medical History:  Diagnosis Date   Arthritis    Bronchitis    Coronary artery disease    Depression    HOH (hard of hearing)    AIDS   Hypertension    Sleep apnea    CPAP   Past Surgical History:  Procedure Laterality Date   ABDOMINAL  HYSTERECTOMY     APPENDECTOMY     BACK SURGERY     X 3   CATARACT EXTRACTION W/ INTRAOCULAR LENS IMPLANT Right 2017   CATARACT EXTRACTION W/PHACO Left 08/08/2016   Procedure: CATARACT EXTRACTION PHACO AND INTRAOCULAR LENS PLACEMENT (IOC);  Surgeon: Sallee Lange, MD;  Location: [facility];  Service: Ophthalmology;  Laterality: Left;  fluid lot # [id number]  exp02/28/2019 [place]    01:15.3 AP%   25.2 CDE   35.06    CTR Right    FOOT SURGERY     JOINT REPLACEMENT     TKR   KNEE ARTHROPLASTY Left 11/24/2018   Procedure: COMPUTER ASSISTED TOTAL KNEE ARTHROPLASTY-LEFT;  Surgeon: Donato Heinz, MD;  Location: [facility];  Service: Orthopedics;  Laterality: Left;   NOSE SURGERY     PACEMAKER INSERTION     PACEMAKER LEADLESS INSERTION  10/25/2022   PACEMAKER LEADLESS INSERTION N/A 10/25/2022   Procedure: PACEMAKER LEADLESS INSERTION;  Surgeon: Marcina Millard, MD;  Location: [facility];  Service: Cardiovascular;  Laterality: N/A;   RCR Left    SHOULDER ARTHROSCOPY Right    for bursitis   SPINE SURGERY  1973, 1978, 2016   Most recent Laminectomy to T11 and L3   Family History  Problem Relation Age of Onset   Congestive Heart Failure Mother    Diabetes Father    Congestive Heart Failure Father    Congestive Heart Failure Sister    Congestive Heart Failure Brother    Social History  Socioeconomic History   Marital status: Widowed    Spouse name: Not on file   Number of children: Not on file   Years of education: Not on file   Highest education level: Not on file  Occupational History   Not on file  Tobacco Use   Smoking status: Former    Types: Cigarettes    Quit date: 10/08/1960    Years since quitting: 62.4   Smokeless tobacco: Never  Vaping Use   Vaping Use: Never used  Substance and Sexual Activity   Alcohol use: Yes    Comment: 2 cocktails   Drug use: Never   Sexual activity: Not on file  Other Topics Concern   Not on file  Social History Narrative    Not on file   Social Determinants of Health   Financial Resource Strain: Not on file  Food Insecurity: No Food Insecurity (10/25/2022)   Hunger Vital Sign    Worried About Running Out of Food in the Last Year: Never true    Ran Out of Food in the Last Year: Never true  Transportation Needs: No Transportation Needs (10/25/2022)   PRAPARE - Administrator, Civil Service (Medical): No    Lack of Transportation (Non-Medical): No  Physical Activity: Not on file  Stress: Not on file  Social Connections: Not on file     Review of Systems  Constitutional:  Negative for appetite change and unexpected weight change.  HENT:  Negative for congestion and sinus pressure.   Respiratory:  Negative for cough, chest tightness and shortness of breath.   Cardiovascular:  Negative for chest pain and palpitations.       No increased swelling.   Gastrointestinal:  Negative for abdominal pain, diarrhea, nausea and vomiting.  Genitourinary:  Negative for difficulty urinating and dysuria.  Musculoskeletal:  Negative for joint swelling and myalgias.  Skin:  Negative for color change and rash.  Neurological:  Negative for dizziness and headaches.  Psychiatric/Behavioral:  Negative for agitation and dysphoric mood.        Objective:     BP 130/70   Pulse 76   Temp 97.9 F (36.6 C)   Resp 16   Ht 5' 7.5" (1.715 m)   Wt 245 lb (111.1 kg)   SpO2 97%   BMI 37.81 kg/m  Wt Readings from Last 3 Encounters:  02/13/23 245 lb (111.1 kg)  12/14/22 243 lb (110.2 kg)  10/25/22 240 lb (108.9 kg)    Physical Exam Vitals reviewed.  Constitutional:      General: She is not in acute distress.    Appearance: Normal appearance.  HENT:     Head: Normocephalic and atraumatic.     Right Ear: External ear normal.     Left Ear: External ear normal.  Eyes:     General: No scleral icterus.       Right eye: No discharge.        Left eye: No discharge.     Conjunctiva/sclera: Conjunctivae normal.   Neck:     Thyroid: No thyromegaly.  Cardiovascular:     Rate and Rhythm: Normal rate and regular rhythm.  Pulmonary:     Effort: No respiratory distress.     Breath sounds: Normal breath sounds. No wheezing.  Abdominal:     General: Bowel sounds are normal.     Palpations: Abdomen is soft.     Tenderness: There is no abdominal tenderness.  Musculoskeletal:  General: No swelling or tenderness.     Cervical back: Neck supple. No tenderness.  Lymphadenopathy:     Cervical: No cervical adenopathy.  Skin:    Findings: No erythema or rash.  Neurological:     Mental Status: She is alert.  Psychiatric:        Mood and Affect: Mood normal.        Behavior: Behavior normal.      Outpatient Encounter Medications as of 02/13/2023  Medication Sig   acetaminophen (TYLENOL) 500 MG tablet Take 500 mg by mouth every 6 (six) hours as needed.   amLODipine (NORVASC) 5 MG tablet Take 1 tablet (5 mg total) by mouth 2 (two) times daily.   aspirin 81 MG EC tablet Take 81 mg by mouth daily.   atorvastatin (LIPITOR) 40 MG tablet Take 1 tablet (40 mg total) by mouth at bedtime. TAKE 1 TABLET(40 MG) BY MOUTH AT BEDTIME   Coenzyme Q10 (COQ10) 200 MG CAPS Take 1 capsule by mouth daily.   Doxylamine Succinate, Sleep, (SLEEP AID PO) Take 1 tablet by mouth at bedtime as needed (sleep).   FLUoxetine (PROZAC) 40 MG capsule Take 1 capsule (40 mg total) by mouth daily.   hydrochlorothiazide (HYDRODIURIL) 25 MG tablet TAKE 1 TABLET(25 MG) BY MOUTH EVERY EVENING   ketoconazole (NIZORAL) 2 % cream Apply topically.   losartan (COZAAR) 100 MG tablet TAKE 1 TABLET(100 MG) BY MOUTH DAILY   Multiple Vitamins-Minerals (CENTRUM SILVER PO) Take 1 tablet by mouth daily.   nystatin cream (MYCOSTATIN) Apply 1 application topically 2 (two) times daily.   Polyethylene Glycol 3350 (MIRALAX PO) Take 17 g by mouth daily as needed (constipation).   senna (SENOKOT) 8.6 MG tablet Take 1 tablet by mouth daily.   [DISCONTINUED]  amLODipine (NORVASC) 5 MG tablet Take 1 tablet (5 mg total) by mouth 2 (two) times daily.   [DISCONTINUED] atorvastatin (LIPITOR) 40 MG tablet TAKE 1 TABLET(40 MG) BY MOUTH AT BEDTIME (Patient taking differently: Take 40 mg by mouth at bedtime. TAKE 1 TABLET(40 MG) BY MOUTH AT BEDTIME)   [DISCONTINUED] azithromycin (ZITHROMAX) 250 MG tablet Take 1 tablet (250 mg total) by mouth daily. Take first 2 tablets together, then 1 every day until finished.   [DISCONTINUED] FLUoxetine (PROZAC) 40 MG capsule Take 1 capsule (40 mg total) by mouth daily.   [DISCONTINUED] hydrochlorothiazide (HYDRODIURIL) 25 MG tablet TAKE 1 TABLET(25 MG) BY MOUTH EVERY EVENING   [DISCONTINUED] losartan (COZAAR) 100 MG tablet TAKE 1 TABLET(100 MG) BY MOUTH DAILY   [DISCONTINUED] traZODone (DESYREL) 50 MG tablet 1/2 tablet q hs prn   No facility-administered encounter medications on file as of 02/13/2023.     Lab Results  Component Value Date   WBC 7.3 08/16/2022   HGB 12.3 08/16/2022   HCT 37.1 08/16/2022   PLT 241.0 08/16/2022   GLUCOSE 107 (H) 02/11/2023   CHOL 154 02/11/2023   TRIG 97.0 02/11/2023   HDL 64.30 02/11/2023   LDLCALC 70 02/11/2023   ALT 14 02/11/2023   AST 14 02/11/2023   NA 140 02/11/2023   K 4.1 02/11/2023   CL 104 02/11/2023   CREATININE 1.23 (H) 02/11/2023   BUN 16 02/11/2023   CO2 27 02/11/2023   TSH 1.39 06/07/2022   INR 0.96 11/12/2018   HGBA1C 6.1 02/11/2023    No results found.     Assessment & Plan:  Hyperglycemia Assessment & Plan: Low carb diet and exercise.  Follow met b and a1c.   Orders: -  Hemoglobin A1c; Future  Stage 3b chronic kidney disease (HCC) Assessment & Plan: Avoid antiinflammatories.  Continue losartan.  Follow metabolic panel.   Orders: -     Basic metabolic panel; Future -     Urinalysis, Routine w reflex microscopic; Future  Essential hypertension Assessment & Plan: Blood pressure doing well. Doing well on losartan, hctz and amlodipine. Follow  pressures.  Follow metabolic panel.    Orders: -     TSH; Future  Hyperlipidemia, unspecified hyperlipidemia type Assessment & Plan: Continue lipitor.  Low cholesterol diet and exercise.  Follow lipid panel and liver function tests.   Orders: -     Lipid panel; Future -     Hepatic function panel; Future  Bilateral carotid artery disease, unspecified type [facility]) Assessment & Plan: 09/2022 - carotid ultrasound - <40% stenosis - f/u 12 months.    Carpal tunnel syndrome, unspecified laterality Assessment & Plan: Evaluated 01/29/23 - left carpal tunnel and left middle finger stenosing tenosynovitis.  S/p injections. Scheduled for MRI - wrist 04/02/23.  She has noticed no increased swelling.  Some improvement in sensation.     History of complete heart block Assessment & Plan: Is s/p micra pacer insertion 10/25/22. Since her pacemaker placement, she feels better.  Energy better.  Walking.  Overall feels better.  No increased sob.   Mild depression Assessment & Plan: Continue prozac.  Appears to be handling things relatively well.  Follow.    Moderate mitral insufficiency Assessment & Plan: ECHO 03/2022 - mild to moderate regurgitation.   Sleep apnea, unspecified type Assessment & Plan: CPAP   Other orders -     amLODIPine Besylate; Take 1 tablet (5 mg total) by mouth 2 (two) times daily.  Dispense: 180 tablet; Refill: 3 -     Atorvastatin Calcium; Take 1 tablet (40 mg total) by mouth at bedtime. TAKE 1 TABLET(40 MG) BY MOUTH AT BEDTIME  Dispense: 90 tablet; Refill: 3 -     FLUoxetine HCl; Take 1 capsule (40 mg total) by mouth daily.  Dispense: 90 capsule; Refill: 1 -     hydroCHLOROthiazide; TAKE 1 TABLET(25 MG) BY MOUTH EVERY EVENING  Dispense: 90 tablet; Refill: 3 -     Losartan Potassium; TAKE 1 TABLET(100 MG) BY MOUTH DAILY  Dispense: 90 tablet; Refill: 3     Dale Laughlin, MD

## 2023-02-19 DIAGNOSIS — I442 Atrioventricular block, complete: Secondary | ICD-10-CM | POA: Diagnosis not present

## 2023-03-03 ENCOUNTER — Encounter: Payer: Self-pay | Admitting: Internal Medicine

## 2023-03-03 NOTE — Assessment & Plan Note (Signed)
CPAP.  

## 2023-03-03 NOTE — Assessment & Plan Note (Signed)
Continue prozac.  Appears to be handling things relatively well.  Follow.  

## 2023-03-03 NOTE — Assessment & Plan Note (Signed)
Evaluated 01/29/23 - left carpal tunnel and left middle finger stenosing tenosynovitis.  S/p injections. Scheduled for MRI - wrist 04/02/23.  She has noticed no increased swelling.  Some improvement in sensation.

## 2023-03-03 NOTE — Assessment & Plan Note (Signed)
Avoid antiinflammatories.  Continue losartan.  Follow metabolic panel.  

## 2023-03-03 NOTE — Assessment & Plan Note (Signed)
ECHO 03/2022 - mild to moderate regurgitation. 

## 2023-03-03 NOTE — Assessment & Plan Note (Signed)
Low carb diet and exercise.  Follow met b and a1c.   

## 2023-03-03 NOTE — Assessment & Plan Note (Signed)
Continue lipitor.  Low cholesterol diet and exercise.  Follow lipid panel and liver function tests.   

## 2023-03-03 NOTE — Assessment & Plan Note (Signed)
09/2022 - carotid ultrasound - <40% stenosis - f/u 12 months.  

## 2023-03-03 NOTE — Assessment & Plan Note (Signed)
Is s/p micra pacer insertion 10/25/22. Since her pacemaker placement, she feels better.  Energy better.  Walking.  Overall feels better.  No increased sob. 

## 2023-03-03 NOTE — Assessment & Plan Note (Signed)
Blood pressure doing well. Doing well on losartan, hctz and amlodipine. Follow pressures.  Follow metabolic panel.

## 2023-03-15 DIAGNOSIS — I10 Essential (primary) hypertension: Secondary | ICD-10-CM | POA: Diagnosis not present

## 2023-03-15 DIAGNOSIS — I340 Nonrheumatic mitral (valve) insufficiency: Secondary | ICD-10-CM | POA: Diagnosis not present

## 2023-03-15 DIAGNOSIS — Z87891 Personal history of nicotine dependence: Secondary | ICD-10-CM | POA: Diagnosis not present

## 2023-03-15 DIAGNOSIS — I447 Left bundle-branch block, unspecified: Secondary | ICD-10-CM | POA: Diagnosis not present

## 2023-03-15 DIAGNOSIS — N1832 Chronic kidney disease, stage 3b: Secondary | ICD-10-CM | POA: Diagnosis not present

## 2023-03-15 DIAGNOSIS — E785 Hyperlipidemia, unspecified: Secondary | ICD-10-CM | POA: Diagnosis not present

## 2023-03-15 DIAGNOSIS — Z950 Presence of cardiac pacemaker: Secondary | ICD-10-CM | POA: Diagnosis not present

## 2023-03-15 DIAGNOSIS — Z7982 Long term (current) use of aspirin: Secondary | ICD-10-CM | POA: Diagnosis not present

## 2023-03-15 DIAGNOSIS — I442 Atrioventricular block, complete: Secondary | ICD-10-CM | POA: Diagnosis not present

## 2023-03-15 DIAGNOSIS — I517 Cardiomegaly: Secondary | ICD-10-CM | POA: Diagnosis not present

## 2023-03-15 DIAGNOSIS — G4733 Obstructive sleep apnea (adult) (pediatric): Secondary | ICD-10-CM | POA: Diagnosis not present

## 2023-03-20 DIAGNOSIS — G4733 Obstructive sleep apnea (adult) (pediatric): Secondary | ICD-10-CM | POA: Diagnosis not present

## 2023-04-01 DIAGNOSIS — Z961 Presence of intraocular lens: Secondary | ICD-10-CM | POA: Diagnosis not present

## 2023-04-02 ENCOUNTER — Ambulatory Visit (HOSPITAL_COMMUNITY)
Admission: RE | Admit: 2023-04-02 | Discharge: 2023-04-02 | Disposition: A | Discharge status: Home / Self Care | Payer: Medicare Other | Source: Ambulatory Visit | Attending: Physician Assistant | Admitting: Physician Assistant

## 2023-04-02 ENCOUNTER — Ambulatory Visit (HOSPITAL_COMMUNITY)
Admission: RE | Admit: 2023-04-02 | Discharge: 2023-04-02 | Disposition: A | Discharge status: Home / Self Care | Payer: Medicare Other | Source: Ambulatory Visit | Attending: Orthopaedic Surgery | Admitting: Orthopaedic Surgery

## 2023-04-02 DIAGNOSIS — Z950 Presence of cardiac pacemaker: Secondary | ICD-10-CM | POA: Insufficient documentation

## 2023-04-02 DIAGNOSIS — M67432 Ganglion, left wrist: Secondary | ICD-10-CM | POA: Insufficient documentation

## 2023-04-02 DIAGNOSIS — M1812 Unilateral primary osteoarthritis of first carpometacarpal joint, left hand: Secondary | ICD-10-CM | POA: Diagnosis not present

## 2023-04-02 MED ORDER — GADOBUTROL 1 MMOL/ML IV SOLN
10.0000 mL | Freq: Once | INTRAVENOUS | Status: AC | PRN
  Administered 2023-04-02: 10 mL via INTRAVENOUS

## 2023-04-02 NOTE — Progress Notes (Signed)
Per order Changed device settings for MRI to  VOO at 80 bpm  Will program device back to pre-MRI settings after completion of exam, and send transmission.

## 2023-04-09 DIAGNOSIS — M67432 Ganglion, left wrist: Secondary | ICD-10-CM | POA: Diagnosis not present

## 2023-04-09 DIAGNOSIS — G5602 Carpal tunnel syndrome, left upper limb: Secondary | ICD-10-CM | POA: Diagnosis not present

## 2023-05-02 DIAGNOSIS — Z01818 Encounter for other preprocedural examination: Secondary | ICD-10-CM | POA: Diagnosis not present

## 2023-05-17 ENCOUNTER — Other Ambulatory Visit: Payer: Medicare Other

## 2023-05-17 DIAGNOSIS — G5602 Carpal tunnel syndrome, left upper limb: Secondary | ICD-10-CM | POA: Diagnosis not present

## 2023-05-17 DIAGNOSIS — R2232 Localized swelling, mass and lump, left upper limb: Secondary | ICD-10-CM | POA: Diagnosis not present

## 2023-05-17 DIAGNOSIS — G8918 Other acute postprocedural pain: Secondary | ICD-10-CM | POA: Diagnosis not present

## 2023-05-22 ENCOUNTER — Encounter: Payer: Medicare Other | Admitting: Internal Medicine

## 2023-05-23 ENCOUNTER — Encounter (INDEPENDENT_AMBULATORY_CARE_PROVIDER_SITE_OTHER): Payer: Self-pay

## 2023-05-29 ENCOUNTER — Telehealth: Payer: Self-pay

## 2023-05-29 NOTE — Telephone Encounter (Signed)
Archie is calling from [employer] to state they sent document regarding prescription for CPAP supplies for patient and would like to know if we received it.

## 2023-05-30 NOTE — Telephone Encounter (Signed)
Form received, provider out of office this week. Will be signed and faxed back next week,

## 2023-05-30 NOTE — Telephone Encounter (Signed)
Form printed & placed in folder for completion

## 2023-06-03 NOTE — Telephone Encounter (Signed)
Form in box.  Need cpap settings for form.

## 2023-06-05 NOTE — Telephone Encounter (Signed)
Faxed

## 2023-06-05 NOTE — Telephone Encounter (Signed)
CPAP settings 5. Confirmed with pt. Also confirmed that rx for CPAP supplies are legit. She does not need supplies right now but per patient, she will advise them to hold on sending until she needs them. Form placed back in folder for you to sign.

## 2023-06-05 NOTE — Telephone Encounter (Signed)
Signed and placed in box.   

## 2023-06-24 ENCOUNTER — Other Ambulatory Visit (INDEPENDENT_AMBULATORY_CARE_PROVIDER_SITE_OTHER): Payer: Medicare Other

## 2023-06-24 DIAGNOSIS — N1832 Chronic kidney disease, stage 3b: Secondary | ICD-10-CM

## 2023-06-24 DIAGNOSIS — E785 Hyperlipidemia, unspecified: Secondary | ICD-10-CM | POA: Diagnosis not present

## 2023-06-24 DIAGNOSIS — R739 Hyperglycemia, unspecified: Secondary | ICD-10-CM | POA: Diagnosis not present

## 2023-06-24 DIAGNOSIS — I10 Essential (primary) hypertension: Secondary | ICD-10-CM

## 2023-06-24 LAB — HEPATIC FUNCTION PANEL
ALT: 15 U/L (ref 0–35)
AST: 18 U/L (ref 0–37)
Albumin: 4 g/dL (ref 3.5–5.2)
Alkaline Phosphatase: 77 U/L (ref 39–117)
Bilirubin, Direct: 0.2 mg/dL (ref 0.0–0.3)
Total Bilirubin: 0.8 mg/dL (ref 0.2–1.2)
Total Protein: 6.5 g/dL (ref 6.0–8.3)

## 2023-06-24 LAB — BASIC METABOLIC PANEL
BUN: 31 mg/dL — ABNORMAL HIGH (ref 6–23)
CO2: 26 meq/L (ref 19–32)
Calcium: 9.2 mg/dL (ref 8.4–10.5)
Chloride: 103 meq/L (ref 96–112)
Creatinine, Ser: 1.45 mg/dL — ABNORMAL HIGH (ref 0.40–1.20)
GFR: 33.62 mL/min — ABNORMAL LOW (ref >60.00)
Glucose, Bld: 123 mg/dL — ABNORMAL HIGH (ref 70–99)
Potassium: 4.3 meq/L (ref 3.5–5.1)
Sodium: 139 mEq/L (ref 135–145)

## 2023-06-24 LAB — LIPID PANEL
Cholesterol: 147 mg/dL (ref 0–200)
HDL: 64.9 mg/dL (ref >39.00)
LDL Cholesterol: 66 mg/dL (ref 0–99)
NonHDL: 81.66
Total CHOL/HDL Ratio: 2
Triglycerides: 80 mg/dL (ref 0.0–149.0)
VLDL: 16 mg/dL (ref 0.0–40.0)

## 2023-06-24 LAB — TSH: TSH: 2.75 u[IU]/mL (ref 0.35–5.50)

## 2023-06-24 LAB — HEMOGLOBIN A1C: Hgb A1c MFr Bld: 5.8 % (ref 4.6–6.5)

## 2023-06-25 ENCOUNTER — Telehealth: Payer: Self-pay

## 2023-06-25 NOTE — Telephone Encounter (Signed)
-----   Message from [place] sent at 06/25/2023  4:37 AM EDT ----- Notify - kidney function decreased from last check.  Make sure she is staying hydrated.  Also confirm not taking any antiinflammatory medication.  She has an upcoming appt with me.  Will schedule for recheck at that appt.  See if she has been checking her blood pressures.  Overall sugar control improved.  A1c 5.8.  Cholesterol levels look good. Thyroid test and liver function tests are wnl.

## 2023-06-26 NOTE — Telephone Encounter (Signed)
The patient returned your call. She was given her lab results. She stated she did take ibuprofen last week due to her carpule tunnel surgery for the pain. She also stated her pace maker was adjusted for her MRI, when I asked her if she was taking her BP. She will continue to hydrate with water and return for her follow up on Friday 9/20 for follow up and repeat labs.

## 2023-06-27 NOTE — Telephone Encounter (Signed)
FYI

## 2023-06-28 ENCOUNTER — Ambulatory Visit (INDEPENDENT_AMBULATORY_CARE_PROVIDER_SITE_OTHER): Payer: Medicare Other | Admitting: Internal Medicine

## 2023-06-28 ENCOUNTER — Encounter: Payer: Self-pay | Admitting: Internal Medicine

## 2023-06-28 VITALS — BP 138/74 | HR 64 | Temp 97.9°F | Ht 67.0 in | Wt 249.8 lb

## 2023-06-28 DIAGNOSIS — G4730 Sleep apnea, unspecified: Secondary | ICD-10-CM | POA: Diagnosis not present

## 2023-06-28 DIAGNOSIS — Z0000 Encounter for general adult medical examination without abnormal findings: Secondary | ICD-10-CM | POA: Diagnosis not present

## 2023-06-28 DIAGNOSIS — I10 Essential (primary) hypertension: Secondary | ICD-10-CM | POA: Diagnosis not present

## 2023-06-28 DIAGNOSIS — Z23 Encounter for immunization: Secondary | ICD-10-CM | POA: Diagnosis not present

## 2023-06-28 DIAGNOSIS — Z8679 Personal history of other diseases of the circulatory system: Secondary | ICD-10-CM

## 2023-06-28 DIAGNOSIS — I779 Disorder of arteries and arterioles, unspecified: Secondary | ICD-10-CM

## 2023-06-28 DIAGNOSIS — R739 Hyperglycemia, unspecified: Secondary | ICD-10-CM | POA: Diagnosis not present

## 2023-06-28 DIAGNOSIS — M25521 Pain in right elbow: Secondary | ICD-10-CM | POA: Diagnosis not present

## 2023-06-28 DIAGNOSIS — N1832 Chronic kidney disease, stage 3b: Secondary | ICD-10-CM

## 2023-06-28 DIAGNOSIS — I340 Nonrheumatic mitral (valve) insufficiency: Secondary | ICD-10-CM | POA: Diagnosis not present

## 2023-06-28 DIAGNOSIS — E785 Hyperlipidemia, unspecified: Secondary | ICD-10-CM

## 2023-06-28 DIAGNOSIS — G5600 Carpal tunnel syndrome, unspecified upper limb: Secondary | ICD-10-CM | POA: Diagnosis not present

## 2023-06-28 NOTE — Addendum Note (Signed)
Addended by: Warden Fillers on: 06/28/2023 03:26 PM   Modules accepted: Orders

## 2023-06-28 NOTE — Progress Notes (Unsigned)
Subjective:    Patient ID: Amber Bridges, female    DOB: [birth year], 82 y.o.   MRN: [id number]  Patient here for  Chief Complaint  Patient presents with  . Annual Exam    HPI    Past Medical History:  Diagnosis Date  . Arthritis   . Bronchitis   . Coronary artery disease   . Depression   . HOH (hard of hearing)    AIDS  . Hypertension   . Sleep apnea    CPAP   Past Surgical History:  Procedure Laterality Date  . ABDOMINAL HYSTERECTOMY    . APPENDECTOMY    . BACK SURGERY     X 3  . CATARACT EXTRACTION W/ INTRAOCULAR LENS IMPLANT Right 2017  . CATARACT EXTRACTION W/PHACO Left 08/08/2016   Procedure: CATARACT EXTRACTION PHACO AND INTRAOCULAR LENS PLACEMENT (IOC);  Surgeon: Sallee Lange, MD;  Location: [facility] OR[facility];  Service: Ophthalmology;  Laterality: Left;  fluid lot # [id number]  exp02/28/2019 [place]    01:15.3 AP%   25.2 CDE   35.06   . CTR Right   . FOOT SURGERY    . JOINT REPLACEMENT     TKR  . KNEE ARTHROPLASTY Left 11/24/2018   Procedure: COMPUTER ASSISTED TOTAL KNEE ARTHROPLASTY-LEFT;  Surgeon: Donato Heinz, MD;  Location: [facility];  Service: Orthopedics;  Laterality: Left;  . NOSE SURGERY    . PACEMAKER INSERTION    . PACEMAKER LEADLESS INSERTION  10/25/2022  . PACEMAKER LEADLESS INSERTION N/A 10/25/2022   Procedure: PACEMAKER LEADLESS INSERTION;  Surgeon: Marcina Millard, MD;  Location: [facility];  Service: Cardiovascular;  Laterality: N/A;  . RCR Left   . SHOULDER ARTHROSCOPY Right    for bursitis  . SPINE SURGERY  1973, 1978, 2016   Most recent Laminectomy to T11 and L3   Family History  Problem Relation Age of Onset  . Congestive Heart Failure Mother   . Diabetes Father   . Congestive Heart Failure Father   . Congestive Heart Failure Sister   . Congestive Heart Failure Brother    Social History   Socioeconomic History  . Marital status: Widowed    Spouse name: Not on file  . Number of children: Not on file  . Years  of education: Not on file  . Highest education level: Not on file  Occupational History  . Not on file  Tobacco Use  . Smoking status: Former    Current packs/day: 0.00    Types: Cigarettes    Quit date: 10/08/1960    Years since quitting: 62.7  . Smokeless tobacco: Never  Vaping Use  . Vaping status: Never Used  Substance and Sexual Activity  . Alcohol use: Yes    Comment: 2 cocktails  . Drug use: Never  . Sexual activity: Not on file  Other Topics Concern  . Not on file  Social History Narrative  . Not on file   Social Determinants of Health   Financial Resource Strain: Not on file  Food Insecurity: No Food Insecurity (10/25/2022)   Hunger Vital Sign   . Worried About Programme researcher, broadcasting/film/video in the Last Year: Never true   . Ran Out of Food in the Last Year: Never true  Transportation Needs: No Transportation Needs (10/25/2022)   PRAPARE - Transportation   . Lack of Transportation (Medical): No   . Lack of Transportation (Non-Medical): No  Physical Activity: Not on file  Stress: Not on file  Social Connections:  Not on file     Review of Systems     Objective:     BP 138/74   Pulse 64   Temp 97.9 F (36.6 C) (Oral)   Ht 5\' 7"  (1.702 m)   Wt 249 lb 12.8 oz (113.3 kg)   SpO2 98%   BMI 39.12 kg/m  Wt Readings from Last 3 Encounters:  06/28/23 249 lb 12.8 oz (113.3 kg)  02/13/23 245 lb (111.1 kg)  12/14/22 243 lb (110.2 kg)    Physical Exam   Outpatient Encounter Medications as of 06/28/2023  Medication Sig  . acetaminophen (TYLENOL) 500 MG tablet Take 500 mg by mouth every 6 (six) hours as needed.  Marland Kitchen amLODipine (NORVASC) 5 MG tablet Take 1 tablet (5 mg total) by mouth 2 (two) times daily.  Marland Kitchen aspirin 81 MG EC tablet Take 81 mg by mouth daily.  Marland Kitchen atorvastatin (LIPITOR) 40 MG tablet Take 1 tablet (40 mg total) by mouth at bedtime. TAKE 1 TABLET(40 MG) BY MOUTH AT BEDTIME  . Coenzyme Q10 (COQ10) 200 MG CAPS Take 1 capsule by mouth daily.  . Doxylamine Succinate,  Sleep, (SLEEP AID PO) Take 1 tablet by mouth at bedtime as needed (sleep).  Marland Kitchen FLUoxetine (PROZAC) 40 MG capsule Take 1 capsule (40 mg total) by mouth daily.  . hydrochlorothiazide (HYDRODIURIL) 25 MG tablet TAKE 1 TABLET(25 MG) BY MOUTH EVERY EVENING  . ketoconazole (NIZORAL) 2 % cream Apply topically.  Marland Kitchen losartan (COZAAR) 100 MG tablet TAKE 1 TABLET(100 MG) BY MOUTH DAILY  . Multiple Vitamins-Minerals (CENTRUM SILVER PO) Take 1 tablet by mouth daily.  Marland Kitchen nystatin cream (MYCOSTATIN) Apply 1 application topically 2 (two) times daily.  . Polyethylene Glycol 3350 (MIRALAX PO) Take 17 g by mouth daily as needed (constipation).  Marland Kitchen senna (SENOKOT) 8.6 MG tablet Take 1 tablet by mouth daily.   No facility-administered encounter medications on file as of 06/28/2023.     Lab Results  Component Value Date   WBC 7.3 08/16/2022   HGB 12.3 08/16/2022   HCT 37.1 08/16/2022   PLT 241.0 08/16/2022   GLUCOSE 123 (H) 06/24/2023   CHOL 147 06/24/2023   TRIG 80.0 06/24/2023   HDL 64.90 06/24/2023   LDLCALC 66 06/24/2023   ALT 15 06/24/2023   AST 18 06/24/2023   NA 139 06/24/2023   K 4.3 06/24/2023   CL 103 06/24/2023   CREATININE 1.45 (H) 06/24/2023   BUN 31 (H) 06/24/2023   CO2 26 06/24/2023   TSH 2.75 06/24/2023   INR 0.96 11/12/2018   HGBA1C 5.8 06/24/2023    MR WRIST LEFT W WO CONTRAST  Result Date: 04/09/2023 CLINICAL DATA:  Evaluate for volar wrist ganglion versus aneurysm. EXAM: MR OF THE LEFT WRIST WITHOUT AND WITH CONTRAST TECHNIQUE: Multiplanar multisequence MR imaging of the left wrist was performed both before and after the administration of intravenous contrast. CONTRAST:  10mL GADAVIST GADOBUTROL 1 MMOL/ML IV SOLN COMPARISON:  None Available. FINDINGS: Despite efforts by the technologist and patient, mild motion artifact is present on today's exam and could not be eliminated. This reduces exam sensitivity and specificity. Ligaments: The scapholunate and lunotriquetral ligaments appear  intact. Triangular fibrocartilage: The triangular fibrocartilage complex appears intact. The ulnar variance is neutral. Tendons: The flexor and extensor tendons appear unremarkable. No significant tenosynovitis or abnormal synovial enhancement. Carpal tunnel/median nerve: Unremarkable. Guyon's canal: Unremarkable. Joint/cartilage: Mild intercarpal degenerative changes for age, greatest at the 1st carpometacarpal and scaphotrapeziotrapezoidal joints. No significant joint effusions or suspicious  synovial enhancement. Bones/carpal alignment: The carpal bone alignment is normal.There are no significant extra-articular osseous findings. Other: There is a ganglion volar to the distal radius which measures 1.1 x 0.9 x 0.7 cm. This demonstrates peripheral enhancement following contrast. This ganglion is ulnar to and separate from the radial artery. No evidence of aneurysm. No other periarticular fluid collections are identified. IMPRESSION: 1. Small ganglion volar to the distal radius. 2. Mild intercarpal degenerative changes for age. No acute osseous findings. 3. No evidence of tendon or ligament tear. 4. No evidence of aneurysm. Electronically Signed   By: Carey Bullocks M.D.   On: 04/09/2023 10:23   DG Chest 1 View  Result Date: 04/02/2023 CLINICAL DATA:  [id number] Pacemaker [id number] EXAM: CHEST  1 VIEW COMPARISON:  08/16/2022 FINDINGS: Small device projects over the heart. Heart and mediastinal contours within normal limits. Lungs clear. No effusions or pneumothorax. No acute bony abnormality. IMPRESSION: Small device projects over the heart. No active cardiopulmonary disease. Electronically Signed   By: Charlett Nose M.D.   On: 04/02/2023 12:03       Assessment & Plan:  There are no diagnoses linked to this encounter.   Dale , MD

## 2023-06-29 LAB — BASIC METABOLIC PANEL WITH GFR
BUN/Creatinine Ratio: 16 (calc) (ref 6–22)
BUN: 21 mg/dL (ref 7–25)
CO2: 26 mmol/L (ref 20–32)
Calcium: 9.8 mg/dL (ref 8.6–10.4)
Chloride: 105 mmol/L (ref 98–110)
Creat: 1.34 mg/dL — ABNORMAL HIGH (ref 0.60–0.95)
Glucose, Bld: 91 mg/dL (ref 65–99)
Potassium: 4.5 mmol/L (ref 3.5–5.3)
Sodium: 141 mmol/L (ref 135–146)
eGFR: 40 mL/min/{1.73_m2} — ABNORMAL LOW (ref >=60)

## 2023-06-30 ENCOUNTER — Encounter: Payer: Self-pay | Admitting: Internal Medicine

## 2023-06-30 DIAGNOSIS — M25521 Pain in right elbow: Secondary | ICD-10-CM | POA: Insufficient documentation

## 2023-06-30 NOTE — Assessment & Plan Note (Signed)
09/2022 - carotid ultrasound - <40% stenosis - f/u 12 months.

## 2023-06-30 NOTE — Assessment & Plan Note (Signed)
S/p carpal tunnel release and volar mass excision 04/2023.

## 2023-06-30 NOTE — Assessment & Plan Note (Signed)
Is s/p micra pacer insertion 10/25/22. Since her pacemaker placement, she has felt better overall. Denies increased sob.  Does report some increased fatigue.  Discussed EKG and further cardiac evaluation.  Has f/u appt with cardiology 07/23/23.  Wants to hold on f/u EKG, etc.  Discussed staying hydrated.  Recheck metabolic panel.

## 2023-06-30 NOTE — Assessment & Plan Note (Signed)
Blood pressure as outlined. Doing well on losartan, hctz and amlodipine. Follow pressures.  Follow metabolic panel.

## 2023-06-30 NOTE — Assessment & Plan Note (Signed)
Low carb diet and exercise.  Follow met b and a1c.  

## 2023-06-30 NOTE — Assessment & Plan Note (Signed)
Avoid antiinflammatories.  Continue losartan.  GFR decreased more on recent check.  Stay hydrated. Follow metabolic panel.

## 2023-06-30 NOTE — Assessment & Plan Note (Signed)
CPAP.  

## 2023-06-30 NOTE — Assessment & Plan Note (Signed)
Continue lipitor.  Low cholesterol diet and exercise.  Follow lipid panel and liver function tests.

## 2023-06-30 NOTE — Assessment & Plan Note (Signed)
Pain to palpation - right elbow.  Discussed tendinitis, etc. Avoid strained positions and repetitive motion.  Notify if persistent.

## 2023-06-30 NOTE — Assessment & Plan Note (Signed)
ECHO 03/2022 - mild to moderate regurgitation. Continue losartan.

## 2023-06-30 NOTE — Assessment & Plan Note (Signed)
Physical today 03/28/23.

## 2023-07-01 ENCOUNTER — Other Ambulatory Visit: Payer: Self-pay | Admitting: Internal Medicine

## 2023-07-01 ENCOUNTER — Other Ambulatory Visit: Payer: Self-pay

## 2023-07-01 DIAGNOSIS — N1832 Chronic kidney disease, stage 3b: Secondary | ICD-10-CM

## 2023-07-01 DIAGNOSIS — I10 Essential (primary) hypertension: Secondary | ICD-10-CM

## 2023-07-01 NOTE — Progress Notes (Signed)
Order placed for met b.

## 2023-07-17 DIAGNOSIS — I442 Atrioventricular block, complete: Secondary | ICD-10-CM | POA: Diagnosis not present

## 2023-07-22 ENCOUNTER — Other Ambulatory Visit (INDEPENDENT_AMBULATORY_CARE_PROVIDER_SITE_OTHER): Payer: Medicare Other

## 2023-07-22 DIAGNOSIS — R944 Abnormal results of kidney function studies: Secondary | ICD-10-CM

## 2023-07-22 DIAGNOSIS — N1832 Chronic kidney disease, stage 3b: Secondary | ICD-10-CM

## 2023-07-23 LAB — BASIC METABOLIC PANEL
BUN: 32 mg/dL — ABNORMAL HIGH (ref 6–23)
CO2: 26 meq/L (ref 19–32)
Calcium: 9.9 mg/dL (ref 8.4–10.5)
Chloride: 105 meq/L (ref 96–112)
Creatinine, Ser: 1.72 mg/dL — ABNORMAL HIGH (ref 0.40–1.20)
GFR: 27.38 mL/min — ABNORMAL LOW (ref >60.00)
Glucose, Bld: 101 mg/dL — ABNORMAL HIGH (ref 70–99)
Potassium: 4.6 meq/L (ref 3.5–5.1)
Sodium: 142 meq/L (ref 135–145)

## 2023-07-24 NOTE — Progress Notes (Signed)
Order placed for renal ultrasound.

## 2023-07-24 NOTE — Addendum Note (Signed)
Addended by: Charm Barges on: 07/24/2023 03:41 PM   Modules accepted: Orders

## 2023-07-29 ENCOUNTER — Other Ambulatory Visit: Payer: Self-pay | Admitting: Internal Medicine

## 2023-07-31 ENCOUNTER — Other Ambulatory Visit (INDEPENDENT_AMBULATORY_CARE_PROVIDER_SITE_OTHER): Payer: Medicare Other

## 2023-07-31 DIAGNOSIS — I10 Essential (primary) hypertension: Secondary | ICD-10-CM | POA: Diagnosis not present

## 2023-08-01 LAB — COMPREHENSIVE METABOLIC PANEL
ALT: 14 U/L (ref 0–35)
AST: 18 U/L (ref 0–37)
Albumin: 4.3 g/dL (ref 3.5–5.2)
Alkaline Phosphatase: 71 U/L (ref 39–117)
BUN: 26 mg/dL — ABNORMAL HIGH (ref 6–23)
CO2: 26 meq/L (ref 19–32)
Calcium: 9.5 mg/dL (ref 8.4–10.5)
Chloride: 105 meq/L (ref 96–112)
Creatinine, Ser: 1.31 mg/dL — ABNORMAL HIGH (ref 0.40–1.20)
GFR: 37.95 mL/min — ABNORMAL LOW (ref >60.00)
Glucose, Bld: 110 mg/dL — ABNORMAL HIGH (ref 70–99)
Potassium: 4.3 meq/L (ref 3.5–5.1)
Sodium: 139 meq/L (ref 135–145)
Total Bilirubin: 0.7 mg/dL (ref 0.2–1.2)
Total Protein: 7.1 g/dL (ref 6.0–8.3)

## 2023-08-02 ENCOUNTER — Ambulatory Visit
Admission: RE | Admit: 2023-08-02 | Discharge: 2023-08-02 | Disposition: A | Discharge status: Home / Self Care | Payer: Medicare Other | Source: Ambulatory Visit | Attending: Internal Medicine | Admitting: Internal Medicine

## 2023-08-02 ENCOUNTER — Telehealth: Payer: Self-pay

## 2023-08-02 DIAGNOSIS — N1832 Chronic kidney disease, stage 3b: Secondary | ICD-10-CM | POA: Diagnosis not present

## 2023-08-02 DIAGNOSIS — R944 Abnormal results of kidney function studies: Secondary | ICD-10-CM | POA: Diagnosis not present

## 2023-08-02 NOTE — Telephone Encounter (Signed)
Per chart, patient is aware.

## 2023-08-02 NOTE — Telephone Encounter (Signed)
Patient just called back. I read the message to her.

## 2023-08-02 NOTE — Telephone Encounter (Signed)
Lvm for pt to give office a call back see msg below

## 2023-08-02 NOTE — Telephone Encounter (Signed)
-----   Message from Cade sent at 08/02/2023  4:54 AM EDT ----- Please call and notify - kidney function has improved from last check.  We will continue to follow.  Continue to avoid antiinflammatory medication.

## 2023-08-06 DIAGNOSIS — L821 Other seborrheic keratosis: Secondary | ICD-10-CM | POA: Diagnosis not present

## 2023-08-06 DIAGNOSIS — Z859 Personal history of malignant neoplasm, unspecified: Secondary | ICD-10-CM | POA: Diagnosis not present

## 2023-08-06 DIAGNOSIS — L570 Actinic keratosis: Secondary | ICD-10-CM | POA: Diagnosis not present

## 2023-08-06 DIAGNOSIS — L218 Other seborrheic dermatitis: Secondary | ICD-10-CM | POA: Diagnosis not present

## 2023-08-06 DIAGNOSIS — Z85828 Personal history of other malignant neoplasm of skin: Secondary | ICD-10-CM | POA: Diagnosis not present

## 2023-08-06 DIAGNOSIS — L578 Other skin changes due to chronic exposure to nonionizing radiation: Secondary | ICD-10-CM | POA: Diagnosis not present

## 2023-08-06 DIAGNOSIS — Z872 Personal history of diseases of the skin and subcutaneous tissue: Secondary | ICD-10-CM | POA: Diagnosis not present

## 2023-08-15 ENCOUNTER — Telehealth: Payer: Self-pay

## 2023-08-15 NOTE — Telephone Encounter (Signed)
Patient states she has not heard the results from her ultrasound at the hospital.  Patient states she has also not seen it on MyChart.  Patient states she thought we were supposed to have the results by now.

## 2023-08-15 NOTE — Telephone Encounter (Signed)
Pt aware results not back yet and there is a delay in scan results.

## 2023-09-02 ENCOUNTER — Other Ambulatory Visit (INDEPENDENT_AMBULATORY_CARE_PROVIDER_SITE_OTHER): Payer: Self-pay | Admitting: Nurse Practitioner

## 2023-09-02 DIAGNOSIS — I6523 Occlusion and stenosis of bilateral carotid arteries: Secondary | ICD-10-CM

## 2023-09-09 ENCOUNTER — Encounter (INDEPENDENT_AMBULATORY_CARE_PROVIDER_SITE_OTHER): Payer: Self-pay | Admitting: Nurse Practitioner

## 2023-09-09 ENCOUNTER — Ambulatory Visit (INDEPENDENT_AMBULATORY_CARE_PROVIDER_SITE_OTHER): Payer: Medicare Other | Admitting: Nurse Practitioner

## 2023-09-09 ENCOUNTER — Ambulatory Visit (INDEPENDENT_AMBULATORY_CARE_PROVIDER_SITE_OTHER): Payer: Medicare Other

## 2023-09-09 VITALS — BP 166/70 | HR 59 | Resp 18 | Ht 67.5 in | Wt 244.0 lb

## 2023-09-09 DIAGNOSIS — I10 Essential (primary) hypertension: Secondary | ICD-10-CM

## 2023-09-09 DIAGNOSIS — E785 Hyperlipidemia, unspecified: Secondary | ICD-10-CM | POA: Diagnosis not present

## 2023-09-09 DIAGNOSIS — I6523 Occlusion and stenosis of bilateral carotid arteries: Secondary | ICD-10-CM

## 2023-09-09 DIAGNOSIS — R600 Localized edema: Secondary | ICD-10-CM

## 2023-09-09 NOTE — Progress Notes (Signed)
Subjective:    Patient ID: Amber Bridges, female    DOB: 06-04-1941, 82 y.o.   MRN: [id number] Chief Complaint  Patient presents with   Follow-up    1 year carotid.    The patient is seen for follow up evaluation of carotid stenosis. The carotid stenosis followed by ultrasound.   The patient denies amaurosis fugax. There is no recent history of TIA symptoms or focal motor deficits. There is no prior documented CVA.  The patient recently had a pacemaker placed and notes that she has been feeling much better afterwards.  The patient is taking enteric-coated aspirin 81 mg daily.  There is no history of migraine headaches. There is no history of seizures.  No recent shortening of the patient's walking distance or new symptoms consistent with claudication.  No history of rest pain symptoms. No new ulcers or wounds of the lower extremities have occurred.  The patient swelling is improved post pacemaker placement.  There is no history of DVT, PE or superficial thrombophlebitis. No recent episodes of angina or shortness of breath documented.   Carotid Duplex done today shows 40 to 59% stenosis in the right ICA with 1 to 39% stenosis in the left ICA.  Stenosis percentage has increased there has not been a drastic increase in velocities since the previous study done on 09/10/2022    Review of Systems  Cardiovascular:  Negative for leg swelling.  All other systems reviewed and are negative.      Objective:   Physical Exam Vitals reviewed.  HENT:     Head: Normocephalic.  Neck:     Vascular: No carotid bruit.  Cardiovascular:     Rate and Rhythm: Normal rate and regular rhythm.     Pulses: Normal pulses.  Pulmonary:     Effort: Pulmonary effort is normal.  Musculoskeletal:     Right lower leg: No edema.     Left lower leg: No edema.  Skin:    General: Skin is warm and dry.  Neurological:     Mental Status: She is alert and oriented to person, place, and time.   Psychiatric:        Mood and Affect: Mood normal.        Behavior: Behavior normal.        Thought Content: Thought content normal.        Judgment: Judgment normal.     BP (!) 166/70 (BP Location: Right Arm)   Pulse (!) 59   Resp 18   Ht 5' 7.5" (1.715 m)   Wt 244 lb (110.7 kg)   BMI 37.65 kg/m   Past Medical History:  Diagnosis Date   Arthritis    Bronchitis    Coronary artery disease    Depression    HOH (hard of hearing)    AIDS   Hypertension    Sleep apnea    CPAP    Social History   Socioeconomic History   Marital status: Widowed    Spouse name: Not on file   Number of children: Not on file   Years of education: Not on file   Highest education level: Not on file  Occupational History   Not on file  Tobacco Use   Smoking status: Former    Current packs/day: 0.00    Types: Cigarettes    Quit date: 10/08/1960    Years since quitting: 62.9   Smokeless tobacco: Never  Vaping Use   Vaping status: Never Used  Substance  and Sexual Activity   Alcohol use: Yes    Comment: 2 cocktails   Drug use: Never   Sexual activity: Not on file  Other Topics Concern   Not on file  Social History Narrative   Not on file   Social Determinants of Health   Financial Resource Strain: Not on file  Food Insecurity: No Food Insecurity (10/25/2022)   Hunger Vital Sign    Worried About Running Out of Food in the Last Year: Never true    Ran Out of Food in the Last Year: Never true  Transportation Needs: No Transportation Needs (10/25/2022)   PRAPARE - Administrator, Civil Service (Medical): No    Lack of Transportation (Non-Medical): No  Physical Activity: Not on file  Stress: Not on file  Social Connections: Not on file  Intimate Partner Violence: Not At Risk (10/25/2022)   Humiliation, Afraid, Rape, and Kick questionnaire    Fear of Current or Ex-Partner: No    Emotionally Abused: No    Physically Abused: No    Sexually Abused: No    Past Surgical  History:  Procedure Laterality Date   ABDOMINAL HYSTERECTOMY     APPENDECTOMY     BACK SURGERY     X 3   CATARACT EXTRACTION W/ INTRAOCULAR LENS IMPLANT Right 2017   CATARACT EXTRACTION W/PHACO Left 08/08/2016   Procedure: CATARACT EXTRACTION PHACO AND INTRAOCULAR LENS PLACEMENT (IOC);  Surgeon: Sallee Lange, MD;  Location: [facility];  Service: Ophthalmology;  Laterality: Left;  fluid lot # [id number]  exp02/28/2019 [place]    01:15.3 AP%   25.2 CDE   35.06    CTR Right    FOOT SURGERY     JOINT REPLACEMENT     TKR   KNEE ARTHROPLASTY Left 11/24/2018   Procedure: COMPUTER ASSISTED TOTAL KNEE ARTHROPLASTY-LEFT;  Surgeon: Donato Heinz, MD;  Location: [facility];  Service: Orthopedics;  Laterality: Left;   NOSE SURGERY     PACEMAKER INSERTION     PACEMAKER LEADLESS INSERTION  10/25/2022   PACEMAKER LEADLESS INSERTION N/A 10/25/2022   Procedure: PACEMAKER LEADLESS INSERTION;  Surgeon: Marcina Millard, MD;  Location: [facility];  Service: Cardiovascular;  Laterality: N/A;   RCR Left    SHOULDER ARTHROSCOPY Right    for bursitis   SPINE SURGERY  1973, 1978, 2016   Most recent Laminectomy to T11 and L3    Family History  Problem Relation Age of Onset   Congestive Heart Failure Mother    Diabetes Father    Congestive Heart Failure Father    Congestive Heart Failure Sister    Congestive Heart Failure Brother     Allergies  Allergen Reactions   Influenza Virus Vaccine Other (See Comments) and Swelling    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 Can take flu vaccine that does not relate to eggs Site of flu vaccine. Can take flu vaccine that does not relate to eggs    Penicillins Diarrhea    Has patient had a PCN reaction causing immediate rash, facial/tongue/throat swelling, SOB or lightheadedness with hypotension: No Has patient had a PCN reaction causing severe rash involving mucus membranes or skin necrosis: No Has patient had a PCN reaction that  required hospitalization No Has patient had a PCN reaction occurring within the last 10 years: No If all of the above answers are "NO", then may proceed with Cephalosporin use.    Egg White [Egg White (Egg Protein)] Nausea  Only   Lisinopril Cough   Haemophilus B Polysaccharide Vaccine Other (See Comments) and Swelling    Site of flu vaccine. Can take flu vaccine that does not relate to eggs       Latest Ref Rng & Units 08/16/2022   12:13 PM 06/07/2022    7:35 AM 02/08/2021    8:56 AM  CBC  WBC 4.0 - 10.5 K/uL 7.3  4.9  4.4   Hemoglobin 12.0 - 15.0 g/dL 78.2  95.6  21.3   Hematocrit 36.0 - 46.0 % 37.1  36.4  37.3   Platelets 150.0 - 400.0 K/uL 241.0  173.0  177.0       CMP     Component Value Date/Time   NA 139 07/31/2023 1358   K 4.3 07/31/2023 1358   CL 105 07/31/2023 1358   CO2 26 07/31/2023 1358   GLUCOSE 110 (H) 07/31/2023 1358   BUN 26 (H) 07/31/2023 1358   CREATININE 1.31 (H) 07/31/2023 1358   CREATININE 1.34 (H) 06/28/2023 1552   CALCIUM 9.5 07/31/2023 1358   PROT 7.1 07/31/2023 1358   ALBUMIN 4.3 07/31/2023 1358   AST 18 07/31/2023 1358   ALT 14 07/31/2023 1358   ALKPHOS 71 07/31/2023 1358   BILITOT 0.7 07/31/2023 1358   GFRNONAA 46 (L) 10/26/2022 0438   GFRAA 47 (L) 11/12/2018 1148     No results found.     Assessment & Plan:   1. Bilateral carotid artery stenosis Recommend:  Given the patient's asymptomatic subcritical stenosis no further invasive testing or surgery at this time.  Duplex ultrasound shows <50% stenosis bilaterally.  Continue antiplatelet therapy as prescribed Continue management of CAD, HTN and Hyperlipidemia Healthy heart diet,  encouraged exercise at least 4 times per week Follow up in 12 months with duplex ultrasound and physical exam   2. Essential hypertension Continue antihypertensive medications as already ordered, these medications have been reviewed and there are no changes at this time.  3. Hyperlipidemia, unspecified  hyperlipidemia type Continue statin as ordered and reviewed, no changes at this time  4. Lower extremity edema This appears to be resolved following placement of a pacemaker.  Current Outpatient Medications on File Prior to Visit  Medication Sig Dispense Refill   acetaminophen (TYLENOL) 500 MG tablet Take 500 mg by mouth every 6 (six) hours as needed.     amLODipine (NORVASC) 5 MG tablet Take 1 tablet (5 mg total) by mouth 2 (two) times daily. 180 tablet 3   aspirin 81 MG EC tablet Take 81 mg by mouth daily.     atorvastatin (LIPITOR) 40 MG tablet Take 1 tablet (40 mg total) by mouth at bedtime. TAKE 1 TABLET(40 MG) BY MOUTH AT BEDTIME 90 tablet 3   Coenzyme Q10 (COQ10) 200 MG CAPS Take 1 capsule by mouth daily.     Doxylamine Succinate, Sleep, (SLEEP AID PO) Take 1 tablet by mouth at bedtime as needed (sleep).     FLUoxetine (PROZAC) 40 MG capsule TAKE 1 CAPSULE(40 MG) BY MOUTH DAILY 90 capsule 1   hydrochlorothiazide (HYDRODIURIL) 25 MG tablet TAKE 1 TABLET(25 MG) BY MOUTH EVERY EVENING 90 tablet 3   ketoconazole (NIZORAL) 2 % cream Apply topically.     losartan (COZAAR) 100 MG tablet TAKE 1 TABLET(100 MG) BY MOUTH DAILY 90 tablet 3   Multiple Vitamins-Minerals (CENTRUM SILVER PO) Take 1 tablet by mouth daily.     nystatin cream (MYCOSTATIN) Apply 1 application topically 2 (two) times daily. 30 g 0  Polyethylene Glycol 3350 (MIRALAX PO) Take 17 g by mouth daily as needed (constipation).     senna (SENOKOT) 8.6 MG tablet Take 1 tablet by mouth daily.     No current facility-administered medications on file prior to visit.    There are no Patient Instructions on file for this visit. No follow-ups on file.   Georgiana Spinner, NP

## 2023-09-23 ENCOUNTER — Telehealth: Payer: Self-pay | Admitting: Internal Medicine

## 2023-09-23 DIAGNOSIS — I6523 Occlusion and stenosis of bilateral carotid arteries: Secondary | ICD-10-CM | POA: Diagnosis not present

## 2023-09-23 DIAGNOSIS — G4733 Obstructive sleep apnea (adult) (pediatric): Secondary | ICD-10-CM | POA: Diagnosis not present

## 2023-09-23 DIAGNOSIS — Z7982 Long term (current) use of aspirin: Secondary | ICD-10-CM | POA: Diagnosis not present

## 2023-09-23 DIAGNOSIS — I340 Nonrheumatic mitral (valve) insufficiency: Secondary | ICD-10-CM | POA: Diagnosis not present

## 2023-09-23 DIAGNOSIS — I10 Essential (primary) hypertension: Secondary | ICD-10-CM | POA: Diagnosis not present

## 2023-09-23 DIAGNOSIS — N1832 Chronic kidney disease, stage 3b: Secondary | ICD-10-CM | POA: Diagnosis not present

## 2023-09-23 DIAGNOSIS — I517 Cardiomegaly: Secondary | ICD-10-CM | POA: Diagnosis not present

## 2023-09-23 DIAGNOSIS — Z87891 Personal history of nicotine dependence: Secondary | ICD-10-CM | POA: Diagnosis not present

## 2023-09-23 DIAGNOSIS — I447 Left bundle-branch block, unspecified: Secondary | ICD-10-CM | POA: Diagnosis not present

## 2023-09-23 DIAGNOSIS — E785 Hyperlipidemia, unspecified: Secondary | ICD-10-CM | POA: Diagnosis not present

## 2023-09-23 NOTE — Telephone Encounter (Signed)
Patient dropped off a [employer] form to be completed by Dr Lorin Picket. Form is up front in Dr [employer] color folder.

## 2023-09-23 NOTE — Telephone Encounter (Signed)
Allergies listed on form. Med list attached. Form placed in folder for signature. Letter was attached about starting ozempic. Will set up appt for patient to discuss.

## 2023-09-24 NOTE — Telephone Encounter (Signed)
Form placed up front for pick up.

## 2023-09-24 NOTE — Telephone Encounter (Signed)
Patient aware and scheduled.

## 2023-09-24 NOTE — Telephone Encounter (Signed)
Form completed/signed.  Placed in box.  Ok to schedule an appt to discuss medication (wegovy, zepbound, etc)

## 2023-09-27 DIAGNOSIS — I340 Nonrheumatic mitral (valve) insufficiency: Secondary | ICD-10-CM | POA: Diagnosis not present

## 2023-09-27 DIAGNOSIS — I447 Left bundle-branch block, unspecified: Secondary | ICD-10-CM | POA: Diagnosis not present

## 2023-10-15 ENCOUNTER — Telehealth: Payer: Self-pay | Admitting: Internal Medicine

## 2023-10-15 DIAGNOSIS — E785 Hyperlipidemia, unspecified: Secondary | ICD-10-CM

## 2023-10-15 DIAGNOSIS — R739 Hyperglycemia, unspecified: Secondary | ICD-10-CM

## 2023-10-15 DIAGNOSIS — N1832 Chronic kidney disease, stage 3b: Secondary | ICD-10-CM

## 2023-10-15 DIAGNOSIS — I10 Essential (primary) hypertension: Secondary | ICD-10-CM

## 2023-10-15 NOTE — Addendum Note (Signed)
 Addended by: Charm Barges on: 10/15/2023 12:19 PM   Modules accepted: Orders

## 2023-10-15 NOTE — Telephone Encounter (Signed)
 Patient need orders

## 2023-10-15 NOTE — Telephone Encounter (Signed)
 Orders placed for labs

## 2023-10-16 ENCOUNTER — Other Ambulatory Visit (HOSPITAL_COMMUNITY): Payer: Self-pay

## 2023-10-16 ENCOUNTER — Ambulatory Visit (INDEPENDENT_AMBULATORY_CARE_PROVIDER_SITE_OTHER): Payer: Medicare Other | Admitting: Internal Medicine

## 2023-10-16 ENCOUNTER — Telehealth: Payer: Self-pay | Admitting: Pharmacy Technician

## 2023-10-16 VITALS — BP 134/72 | HR 79 | Temp 97.9°F | Resp 16 | Ht 67.5 in | Wt 244.0 lb

## 2023-10-16 DIAGNOSIS — N1832 Chronic kidney disease, stage 3b: Secondary | ICD-10-CM | POA: Diagnosis not present

## 2023-10-16 DIAGNOSIS — Z713 Dietary counseling and surveillance: Secondary | ICD-10-CM | POA: Diagnosis not present

## 2023-10-16 DIAGNOSIS — E785 Hyperlipidemia, unspecified: Secondary | ICD-10-CM

## 2023-10-16 DIAGNOSIS — Z8679 Personal history of other diseases of the circulatory system: Secondary | ICD-10-CM

## 2023-10-16 DIAGNOSIS — G4730 Sleep apnea, unspecified: Secondary | ICD-10-CM | POA: Diagnosis not present

## 2023-10-16 DIAGNOSIS — I10 Essential (primary) hypertension: Secondary | ICD-10-CM | POA: Diagnosis not present

## 2023-10-16 DIAGNOSIS — R739 Hyperglycemia, unspecified: Secondary | ICD-10-CM

## 2023-10-16 DIAGNOSIS — I779 Disorder of arteries and arterioles, unspecified: Secondary | ICD-10-CM

## 2023-10-16 MED ORDER — TIRZEPATIDE-WEIGHT MANAGEMENT 2.5 MG/0.5ML ~~LOC~~ SOLN: 2.5000 mg | SUBCUTANEOUS | 2 refills | Status: DC

## 2023-10-16 NOTE — Telephone Encounter (Signed)
 Pharmacy Patient Advocate Encounter   Received notification from CoverMyMeds that prior authorization for Zepbound  2.5MG /0.5ML pen-injectors is required/requested.   Insurance verification completed.   The patient is insured through [facility] .   Per test claim: PA required; PA submitted to above mentioned insurance via CoverMyMeds Key/confirmation #/EOC Key: [id number] Status is pending

## 2023-10-16 NOTE — Progress Notes (Signed)
 Subjective:    Patient ID: Amber Bridges, female    DOB: 1941/05/21, 83 y.o.   MRN: [id number]  Patient here for  Chief Complaint  Patient presents with   Discuss weight loss meds    Injectable meds    HPI Here as a work in to discuss weight loss medication. Discussed treatment options and possible side effects of the medications. Saw cardiology 09/23/23 - f/u [facility] s/p PPM 10/25/22. Stable. Recommended repeat echo. ECHO 09/27/23 - EF >55%, grade I DD, mild MR, mild TR and trivial PR. Reports from a cardiac standpoint, things are stable. No chest pain reported. Breathing stable. No nausea or vomiting. Did fall - Christmas - was sitting in a chair that broke. She fell - held on to the table and tried to brace herself with her left hand. Some pain - left hand.  Is better. No head injury.    Past Medical History:  Diagnosis Date   Arthritis    Bronchitis    Coronary artery disease    Depression    HOH (hard of hearing)    AIDS   Hypertension    Sleep apnea    CPAP   Past Surgical History:  Procedure Laterality Date   ABDOMINAL HYSTERECTOMY     APPENDECTOMY     BACK SURGERY     X 3   CATARACT EXTRACTION W/ INTRAOCULAR LENS IMPLANT Right 2017   CATARACT EXTRACTION W/PHACO Left 08/08/2016   Procedure: CATARACT EXTRACTION PHACO AND INTRAOCULAR LENS PLACEMENT (IOC);  Surgeon: Steven Dingeldein, MD;  Location: [facility];  Service: Ophthalmology;  Laterality: Left;  fluid lot # [id number]  exp02/28/2019 [place]     01:15.3 AP%   25.2 CDE   35.06    CTR Right    FOOT SURGERY     JOINT REPLACEMENT     TKR   KNEE ARTHROPLASTY Left 11/24/2018   Procedure: COMPUTER ASSISTED TOTAL KNEE ARTHROPLASTY-LEFT;  Surgeon: Mardee Lynwood SQUIBB, MD;  Location: [facility];  Service: Orthopedics;  Laterality: Left;   NOSE SURGERY     PACEMAKER INSERTION     PACEMAKER LEADLESS INSERTION  10/25/2022   PACEMAKER LEADLESS INSERTION N/A 10/25/2022   Procedure: PACEMAKER LEADLESS INSERTION;  Surgeon: Ammon Blunt, MD;  Location: [facility];  Service: Cardiovascular;  Laterality: N/A;   RCR Left    SHOULDER ARTHROSCOPY Right    for bursitis   SPINE SURGERY  1973, 1978, 2016   Most recent Laminectomy to T11 and L3   Family History  Problem Relation Age of Onset   Congestive Heart Failure Mother    Diabetes Father    Congestive Heart Failure Father    Congestive Heart Failure Sister    Congestive Heart Failure Brother    Social History   Socioeconomic History   Marital status: Widowed    Spouse name: Not on file   Number of children: Not on file   Years of education: Not on file   Highest education level: Not on file  Occupational History   Not on file  Tobacco Use   Smoking status: Former    Current packs/day: 0.00    Types: Cigarettes    Quit date: 10/08/1960    Years since quitting: 63.0   Smokeless tobacco: Never  Vaping Use   Vaping status: Never Used  Substance and Sexual Activity   Alcohol use: Yes    Comment: 2 cocktails   Drug use: Never   Sexual activity: Not on file  Other Topics Concern   Not on file  Social History Narrative   Not on file   Social Drivers of Health   Financial Resource Strain: Not on file  Food Insecurity: No Food Insecurity (10/25/2022)   Hunger Vital Sign    Worried About Running Out of Food in the Last Year: Never true    Ran Out of Food in the Last Year: Never true  Transportation Needs: No Transportation Needs (10/25/2022)   PRAPARE - Administrator, Civil Service (Medical): No    Lack of Transportation (Non-Medical): No  Physical Activity: Not on file  Stress: Not on file  Social Connections: Not on file     Review of Systems  Constitutional:  Negative for appetite change and unexpected weight change.  HENT:  Negative for congestion and sinus pressure.   Respiratory:  Negative for cough, chest tightness and shortness of breath.   Cardiovascular:  Negative for chest pain and palpitations.   Gastrointestinal:  Negative for abdominal pain, diarrhea, nausea and vomiting.  Genitourinary:  Negative for difficulty urinating and dysuria.  Musculoskeletal:  Negative for joint swelling and myalgias.  Skin:  Negative for color change and rash.  Neurological:  Negative for dizziness and headaches.  Psychiatric/Behavioral:  Negative for agitation and dysphoric mood.        Objective:     BP 134/72   Pulse 79   Temp 97.9 F (36.6 C)   Resp 16   Ht 5' 7.5 (1.715 m)   Wt 244 lb (110.7 kg)   SpO2 98%   BMI 37.65 kg/m  Wt Readings from Last 3 Encounters:  10/16/23 244 lb (110.7 kg)  09/09/23 244 lb (110.7 kg)  06/28/23 249 lb 12.8 oz (113.3 kg)    Physical Exam Vitals reviewed.  Constitutional:      General: She is not in acute distress.    Appearance: Normal appearance.  HENT:     Head: Normocephalic and atraumatic.     Right Ear: External ear normal.     Left Ear: External ear normal.     Mouth/Throat:     Pharynx: No oropharyngeal exudate or posterior oropharyngeal erythema.  Eyes:     General: No scleral icterus.       Right eye: No discharge.        Left eye: No discharge.     Conjunctiva/sclera: Conjunctivae normal.  Neck:     Thyroid : No thyromegaly.  Cardiovascular:     Rate and Rhythm: Normal rate and regular rhythm.  Pulmonary:     Effort: No respiratory distress.     Breath sounds: Normal breath sounds. No wheezing.  Abdominal:     General: Bowel sounds are normal.     Palpations: Abdomen is soft.     Tenderness: There is no abdominal tenderness.  Musculoskeletal:        General: No swelling or tenderness.     Cervical back: Neck supple. No tenderness.  Lymphadenopathy:     Cervical: No cervical adenopathy.  Skin:    Findings: No erythema or rash.  Neurological:     Mental Status: She is alert.  Psychiatric:        Mood and Affect: Mood normal.        Behavior: Behavior normal.      Outpatient Encounter Medications as of 10/16/2023   Medication Sig   tirzepatide  (ZEPBOUND ) 2.5 MG/0.5ML injection vial Inject 2.5 mg into the skin once a week.   acetaminophen  (TYLENOL ) 500  MG tablet Take 500 mg by mouth every 6 (six) hours as needed.   amLODipine  (NORVASC ) 5 MG tablet Take 1 tablet (5 mg total) by mouth 2 (two) times daily.   aspirin 81 MG EC tablet Take 81 mg by mouth daily.   atorvastatin  (LIPITOR) 40 MG tablet Take 1 tablet (40 mg total) by mouth at bedtime. TAKE 1 TABLET(40 MG) BY MOUTH AT BEDTIME   Coenzyme Q10 (COQ10) 200 MG CAPS Take 1 capsule by mouth daily.   Doxylamine  Succinate, Sleep, (SLEEP AID PO) Take 1 tablet by mouth at bedtime as needed (sleep).   FLUoxetine  (PROZAC ) 40 MG capsule TAKE 1 CAPSULE(40 MG) BY MOUTH DAILY   hydrochlorothiazide  (HYDRODIURIL ) 25 MG tablet TAKE 1 TABLET(25 MG) BY MOUTH EVERY EVENING   ketoconazole (NIZORAL) 2 % cream Apply topically.   losartan  (COZAAR ) 100 MG tablet TAKE 1 TABLET(100 MG) BY MOUTH DAILY   Multiple Vitamins-Minerals (CENTRUM SILVER PO) Take 1 tablet by mouth daily.   nystatin  cream (MYCOSTATIN ) Apply 1 application topically 2 (two) times daily.   Polyethylene Glycol 3350 (MIRALAX PO) Take 17 g by mouth daily as needed (constipation).   senna (SENOKOT) 8.6 MG tablet Take 1 tablet by mouth daily.   No facility-administered encounter medications on file as of 10/16/2023.     Lab Results  Component Value Date   WBC 7.3 08/16/2022   HGB 12.3 08/16/2022   HCT 37.1 08/16/2022   PLT 241.0 08/16/2022   GLUCOSE 110 (H) 07/31/2023   CHOL 147 06/24/2023   TRIG 80.0 06/24/2023   HDL 64.90 06/24/2023   LDLCALC 66 06/24/2023   ALT 14 07/31/2023   AST 18 07/31/2023   NA 139 07/31/2023   K 4.3 07/31/2023   CL 105 07/31/2023   CREATININE 1.31 (H) 07/31/2023   BUN 26 (H) 07/31/2023   CO2 26 07/31/2023   TSH 2.75 06/24/2023   INR 0.96 11/12/2018   HGBA1C 5.8 06/24/2023    US  Renal Result Date: 08/26/2023 CLINICAL DATA:  decreased gfr EXAM: RENAL / URINARY TRACT  ULTRASOUND COMPLETE COMPARISON:  None Available. FINDINGS: Right Kidney: Renal measurements: 11.5 x 4.5 x 6.4 cm = volume: 171 mL. Echogenicity within normal limits. No hydronephrosis visualized. Benign cyst of the inferior pole measuring up to 3 cm (for which no dedicated imaging follow-up is recommended). Left Kidney: Renal measurements: 8.3 x 4.6 x 4.7 cm = volume: 95 mL. Echogenicity within normal limits. No mass or hydronephrosis visualized. Mild cortical thinning. Bladder: Appears normal for degree of bladder distention. Other: None. IMPRESSION: 1. No hydronephrosis. Electronically Signed   By: Corean Salter M.D.   On: 08/26/2023 13:53       Assessment & Plan:  Bilateral carotid artery disease, unspecified type [facility]) Assessment & Plan: 09/2022 - carotid ultrasound - <40% stenosis - f/u 12 months. Will need to confirm f/u appt.    Stage 3b chronic kidney disease (HCC) Assessment & Plan: Avoid antiinflammatories.  Continue losartan .  GFR decreased more on recent check.  Stay hydrated. Follow metabolic panel.    Essential hypertension Assessment & Plan: Blood pressure as outlined. Doing well on losartan , hctz and amlodipine . Follow pressures.  Follow metabolic panel.     History of complete heart block Assessment & Plan: Is s/p micra pacer insertion 10/25/22. Since her pacemaker placement, she has felt better overall. Denies increased sob.    Hyperglycemia Assessment & Plan: Low carb diet and exercise.  Follow met b and a1c.    Hyperlipidemia, unspecified hyperlipidemia type  Assessment & Plan: Continue lipitor.  Low cholesterol diet and exercise.  Follow lipid panel and liver function tests.    Sleep apnea, unspecified type Assessment & Plan: Continue cpap. Has known sleep apnea. Discussed weight loss and treatment options.  Discussed GLP 1 agonist. Discussed possible side effects and contraindications with these medications.  Trial of zepbound .    Encounter for weight  loss counseling Assessment & Plan: Discussed diet and exercise. Discussed weight loss medications and treatment options. Discussed GLP 1 agonist and possible side effects and contraindications with these medications. Given sleep apnea, trial of zepbound .  Follow.    Other orders -     Tirzepatide -Weight Management; Inject 2.5 mg into the skin once a week.  Dispense: 2 mL; Refill: 2     Allena Hamilton, MD

## 2023-10-20 ENCOUNTER — Encounter: Payer: Self-pay | Admitting: Internal Medicine

## 2023-10-20 DIAGNOSIS — Z713 Dietary counseling and surveillance: Secondary | ICD-10-CM | POA: Insufficient documentation

## 2023-10-20 NOTE — Assessment & Plan Note (Signed)
Avoid antiinflammatories.  Continue losartan.  GFR decreased more on recent check.  Stay hydrated. Follow metabolic panel.

## 2023-10-20 NOTE — Assessment & Plan Note (Signed)
 Is s/p micra pacer insertion 10/25/22. Since her pacemaker placement, she has felt better overall. Denies increased sob.

## 2023-10-20 NOTE — Assessment & Plan Note (Signed)
 Continue cpap. Has known sleep apnea. Discussed weight loss and treatment options.  Discussed GLP 1 agonist. Discussed possible side effects and contraindications with these medications.  Trial of zepbound.

## 2023-10-20 NOTE — Assessment & Plan Note (Signed)
 Continue lipitor.  Low cholesterol diet and exercise.  Follow lipid panel and liver function tests.

## 2023-10-20 NOTE — Assessment & Plan Note (Signed)
Blood pressure as outlined. Doing well on losartan, hctz and amlodipine. Follow pressures.  Follow metabolic panel.

## 2023-10-20 NOTE — Assessment & Plan Note (Signed)
 Low carb diet and exercise.  Follow met b and a1c.

## 2023-10-20 NOTE — Assessment & Plan Note (Signed)
 Discussed diet and exercise. Discussed weight loss medications and treatment options. Discussed GLP 1 agonist and possible side effects and contraindications with these medications. Given sleep apnea, trial of zepbound.  Follow.

## 2023-10-20 NOTE — Assessment & Plan Note (Signed)
 09/2022 - carotid ultrasound - <40% stenosis - f/u 12 months. Will need to confirm f/u appt.

## 2023-10-23 NOTE — Telephone Encounter (Signed)
 Patient had sleep study >5 years. Will discuss at appt next week

## 2023-10-23 NOTE — Telephone Encounter (Signed)
 Pharmacy Patient Advocate Encounter  Received notification from [employer] that Prior Authorization for Zepbound 2.5MG /0.5ML pen-injectors  has been DENIED.  Full denial letter will be uploaded to the media tab. See denial reason below.    **Please send supporting documents from a sleep study. PA will continued to be denied without these documents.**   PA #/Case ID/Reference #: [id number]

## 2023-10-27 NOTE — Addendum Note (Signed)
Addended by: Warden Fillers on: 10/27/2023 07:18 PM   Modules accepted: Orders

## 2023-10-28 ENCOUNTER — Other Ambulatory Visit (INDEPENDENT_AMBULATORY_CARE_PROVIDER_SITE_OTHER): Payer: Medicare Other

## 2023-10-28 DIAGNOSIS — E785 Hyperlipidemia, unspecified: Secondary | ICD-10-CM

## 2023-10-28 DIAGNOSIS — R739 Hyperglycemia, unspecified: Secondary | ICD-10-CM

## 2023-10-28 DIAGNOSIS — I10 Essential (primary) hypertension: Secondary | ICD-10-CM | POA: Diagnosis not present

## 2023-10-28 LAB — LIPID PANEL
Cholesterol: 152 mg/dL (ref 0–200)
HDL: 60.9 mg/dL (ref >39.00)
LDL Cholesterol: 71 mg/dL (ref 0–99)
NonHDL: 91.03
Total CHOL/HDL Ratio: 2
Triglycerides: 99 mg/dL (ref 0.0–149.0)
VLDL: 19.8 mg/dL (ref 0.0–40.0)

## 2023-10-28 LAB — BASIC METABOLIC PANEL
BUN: 28 mg/dL — ABNORMAL HIGH (ref 6–23)
CO2: 26 meq/L (ref 19–32)
Calcium: 9.4 mg/dL (ref 8.4–10.5)
Chloride: 103 meq/L (ref 96–112)
Creatinine, Ser: 1.29 mg/dL — ABNORMAL HIGH (ref 0.40–1.20)
GFR: 38.59 mL/min — ABNORMAL LOW (ref >60.00)
Glucose, Bld: 137 mg/dL — ABNORMAL HIGH (ref 70–99)
Potassium: 4.1 meq/L (ref 3.5–5.1)
Sodium: 138 meq/L (ref 135–145)

## 2023-10-28 LAB — CBC WITH DIFFERENTIAL/PLATELET
Basophils Absolute: 0.1 10*3/uL (ref 0.0–0.1)
Basophils Relative: 1 % (ref 0.0–3.0)
Eosinophils Absolute: 0.5 10*3/uL (ref 0.0–0.7)
Eosinophils Relative: 8.3 % — ABNORMAL HIGH (ref 0.0–5.0)
HCT: 38.7 % (ref 36.0–46.0)
Hemoglobin: 12.8 g/dL (ref 12.0–15.0)
Lymphocytes Relative: 18.6 % (ref 12.0–46.0)
Lymphs Abs: 1.1 10*3/uL (ref 0.7–4.0)
MCHC: 33.2 g/dL (ref 30.0–36.0)
MCV: 93.6 fL (ref 78.0–100.0)
Monocytes Absolute: 0.5 10*3/uL (ref 0.1–1.0)
Monocytes Relative: 8.8 % (ref 3.0–12.0)
Neutro Abs: 3.6 10*3/uL (ref 1.4–7.7)
Neutrophils Relative %: 63.3 % (ref 43.0–77.0)
Platelets: 221 10*3/uL (ref 150.0–400.0)
RBC: 4.13 Mil/uL (ref 3.87–5.11)
RDW: 14.2 % (ref 11.5–15.5)
WBC: 5.7 10*3/uL (ref 4.0–10.5)

## 2023-10-28 LAB — HEPATIC FUNCTION PANEL
ALT: 15 U/L (ref 0–35)
AST: 18 U/L (ref 0–37)
Albumin: 4.3 g/dL (ref 3.5–5.2)
Alkaline Phosphatase: 62 U/L (ref 39–117)
Bilirubin, Direct: 0.2 mg/dL (ref 0.0–0.3)
Total Bilirubin: 0.7 mg/dL (ref 0.2–1.2)
Total Protein: 7.2 g/dL (ref 6.0–8.3)

## 2023-10-28 LAB — HEMOGLOBIN A1C: Hgb A1c MFr Bld: 6.1 % (ref 4.6–6.5)

## 2023-10-30 ENCOUNTER — Telehealth: Payer: Self-pay

## 2023-10-30 ENCOUNTER — Ambulatory Visit: Payer: Medicare Other | Admitting: Internal Medicine

## 2023-10-30 NOTE — Telephone Encounter (Signed)
 Pt rescheduled

## 2023-10-30 NOTE — Progress Notes (Deleted)
Subjective:    Patient ID: Amber Bridges, female    DOB: 09-03-1941, 83 y.o.   MRN: [id number]  Patient here for No chief complaint on file.   HPI Here for a scheduled follow up - follow up regarding hypertension, hypercholesterolemia and sleep apnea. Saw cardiology 09/23/23 - f/u [facility] s/p PPM 10/25/22. Stable. Recommended repeat echo. ECHO 09/27/23 - EF >55%, grade I DD, mild MR, mild TR and trivial PR. Reports from a cardiac standpoint, things are stable.    Past Medical History:  Diagnosis Date   Arthritis    Bronchitis    Coronary artery disease    Depression    HOH (hard of hearing)    AIDS   Hypertension    Sleep apnea    CPAP   Past Surgical History:  Procedure Laterality Date   ABDOMINAL HYSTERECTOMY     APPENDECTOMY     BACK SURGERY     X 3   CATARACT EXTRACTION W/ INTRAOCULAR LENS IMPLANT Right 2017   CATARACT EXTRACTION W/PHACO Left 08/08/2016   Procedure: CATARACT EXTRACTION PHACO AND INTRAOCULAR LENS PLACEMENT (IOC);  Surgeon: Sallee Lange, MD;  Location: [facility];  Service: Ophthalmology;  Laterality: Left;  fluid lot # [id number]  exp02/28/2019 [place]    01:15.3 AP%   25.2 CDE   35.06    CTR Right    FOOT SURGERY     JOINT REPLACEMENT     TKR   KNEE ARTHROPLASTY Left 11/24/2018   Procedure: COMPUTER ASSISTED TOTAL KNEE ARTHROPLASTY-LEFT;  Surgeon: Donato Heinz, MD;  Location: [facility];  Service: Orthopedics;  Laterality: Left;   NOSE SURGERY     PACEMAKER INSERTION     PACEMAKER LEADLESS INSERTION  10/25/2022   PACEMAKER LEADLESS INSERTION N/A 10/25/2022   Procedure: PACEMAKER LEADLESS INSERTION;  Surgeon: Marcina Millard, MD;  Location: [facility];  Service: Cardiovascular;  Laterality: N/A;   RCR Left    SHOULDER ARTHROSCOPY Right    for bursitis   SPINE SURGERY  1973, 1978, 2016   Most recent Laminectomy to T11 and L3   Family History  Problem Relation Age of Onset   Congestive Heart Failure Mother    Diabetes Father     Congestive Heart Failure Father    Congestive Heart Failure Sister    Congestive Heart Failure Brother    Social History   Socioeconomic History   Marital status: Widowed    Spouse name: Not on file   Number of children: Not on file   Years of education: Not on file   Highest education level: Not on file  Occupational History   Not on file  Tobacco Use   Smoking status: Former    Current packs/day: 0.00    Types: Cigarettes    Quit date: 10/08/1960    Years since quitting: 63.1   Smokeless tobacco: Never  Vaping Use   Vaping status: Never Used  Substance and Sexual Activity   Alcohol use: Yes    Comment: 2 cocktails   Drug use: Never   Sexual activity: Not on file  Other Topics Concern   Not on file  Social History Narrative   Not on file   Social Drivers of Health   Financial Resource Strain: Not on file  Food Insecurity: No Food Insecurity (10/25/2022)   Hunger Vital Sign    Worried About Running Out of Food in the Last Year: Never true    Ran Out of Food in the Last Year:  Never true  Transportation Needs: No Transportation Needs (10/25/2022)   PRAPARE - Administrator, Civil Service (Medical): No    Lack of Transportation (Non-Medical): No  Physical Activity: Not on file  Stress: Not on file  Social Connections: Not on file     Review of Systems     Objective:     There were no vitals taken for this visit. Wt Readings from Last 3 Encounters:  10/16/23 244 lb (110.7 kg)  09/09/23 244 lb (110.7 kg)  06/28/23 249 lb 12.8 oz (113.3 kg)    Physical Exam  {Perform Simple Foot Exam  Perform Detailed exam:1} {Insert foot Exam (Optional):30965}   Outpatient Encounter Medications as of 10/30/2023  Medication Sig   acetaminophen (TYLENOL) 500 MG tablet Take 500 mg by mouth every 6 (six) hours as needed.   amLODipine (NORVASC) 5 MG tablet Take 1 tablet (5 mg total) by mouth 2 (two) times daily.   aspirin 81 MG EC tablet Take 81 mg by mouth daily.    atorvastatin (LIPITOR) 40 MG tablet Take 1 tablet (40 mg total) by mouth at bedtime. TAKE 1 TABLET(40 MG) BY MOUTH AT BEDTIME   Coenzyme Q10 (COQ10) 200 MG CAPS Take 1 capsule by mouth daily.   Doxylamine Succinate, Sleep, (SLEEP AID PO) Take 1 tablet by mouth at bedtime as needed (sleep).   FLUoxetine (PROZAC) 40 MG capsule TAKE 1 CAPSULE(40 MG) BY MOUTH DAILY   hydrochlorothiazide (HYDRODIURIL) 25 MG tablet TAKE 1 TABLET(25 MG) BY MOUTH EVERY EVENING   ketoconazole (NIZORAL) 2 % cream Apply topically.   losartan (COZAAR) 100 MG tablet TAKE 1 TABLET(100 MG) BY MOUTH DAILY   Multiple Vitamins-Minerals (CENTRUM SILVER PO) Take 1 tablet by mouth daily.   nystatin cream (MYCOSTATIN) Apply 1 application topically 2 (two) times daily.   Polyethylene Glycol 3350 (MIRALAX PO) Take 17 g by mouth daily as needed (constipation).   senna (SENOKOT) 8.6 MG tablet Take 1 tablet by mouth daily.   tirzepatide (ZEPBOUND) 2.5 MG/0.5ML injection vial Inject 2.5 mg into the skin once a week.   No facility-administered encounter medications on file as of 10/30/2023.     Lab Results  Component Value Date   WBC 5.7 10/28/2023   HGB 12.8 10/28/2023   HCT 38.7 10/28/2023   PLT 221.0 10/28/2023   GLUCOSE 137 (H) 10/28/2023   CHOL 152 10/28/2023   TRIG 99.0 10/28/2023   HDL 60.90 10/28/2023   LDLCALC 71 10/28/2023   ALT 15 10/28/2023   AST 18 10/28/2023   NA 138 10/28/2023   K 4.1 10/28/2023   CL 103 10/28/2023   CREATININE 1.29 (H) 10/28/2023   BUN 28 (H) 10/28/2023   CO2 26 10/28/2023   TSH 2.75 06/24/2023   INR 0.96 11/12/2018   HGBA1C 6.1 10/28/2023    US Renal Result Date: 08/26/2023 CLINICAL DATA:  decreased gfr EXAM: RENAL / URINARY TRACT ULTRASOUND COMPLETE COMPARISON:  None Available. FINDINGS: Right Kidney: Renal measurements: 11.5 x 4.5 x 6.4 cm = volume: 171 mL. Echogenicity within normal limits. No hydronephrosis visualized. Benign cyst of the inferior pole measuring up to 3 cm (for  which no dedicated imaging follow-up is recommended). Left Kidney: Renal measurements: 8.3 x 4.6 x 4.7 cm = volume: 95 mL. Echogenicity within normal limits. No mass or hydronephrosis visualized. Mild cortical thinning. Bladder: Appears normal for degree of bladder distention. Other: None. IMPRESSION: 1. No hydronephrosis. Electronically Signed   By: Meda Klinefelter M.D.   On:  08/26/2023 13:53       Assessment & Plan:  There are no diagnoses linked to this encounter.   [place], MD

## 2023-10-30 NOTE — Telephone Encounter (Signed)
Copied from CRM [id number]. Topic: Appointments - Scheduling Inquiry for Clinic >> Oct 30, 2023  8:13 AM Irine Seal wrote: Reason for CRM: PT had an an  appt for today 10/30/23 due to the weather and her fall history she was not comfortable leaving her house. When I went to reschedule the appt, it showed no openings till march, pt is due for a 4 month follow up, she wanted me to send a message asking if there was any way to get her in for an OV with Dr. Lorin Picket before then so she wont be 2 months behind. She is not available tue mornings.  Pt requested a callback since she does not use mychart [phone]

## 2023-10-30 NOTE — Assessment & Plan Note (Deleted)
09/2023 - f/u [facility] - Duplex ultrasound shows <50% stenosis bilaterally. Continue antiplatelet therapy. Follow up in 12 months with duplex ultrasound and physical exam

## 2023-11-06 ENCOUNTER — Encounter: Payer: Self-pay | Admitting: Internal Medicine

## 2023-11-06 ENCOUNTER — Ambulatory Visit (INDEPENDENT_AMBULATORY_CARE_PROVIDER_SITE_OTHER): Payer: Medicare Other | Admitting: Internal Medicine

## 2023-11-06 VITALS — BP 132/70 | HR 56 | Temp 97.9°F | Ht 67.5 in | Wt 243.8 lb

## 2023-11-06 DIAGNOSIS — N1832 Chronic kidney disease, stage 3b: Secondary | ICD-10-CM | POA: Diagnosis not present

## 2023-11-06 DIAGNOSIS — Z8679 Personal history of other diseases of the circulatory system: Secondary | ICD-10-CM

## 2023-11-06 DIAGNOSIS — R739 Hyperglycemia, unspecified: Secondary | ICD-10-CM | POA: Diagnosis not present

## 2023-11-06 DIAGNOSIS — Z713 Dietary counseling and surveillance: Secondary | ICD-10-CM

## 2023-11-06 DIAGNOSIS — E785 Hyperlipidemia, unspecified: Secondary | ICD-10-CM | POA: Diagnosis not present

## 2023-11-06 DIAGNOSIS — I10 Essential (primary) hypertension: Secondary | ICD-10-CM

## 2023-11-06 DIAGNOSIS — I779 Disorder of arteries and arterioles, unspecified: Secondary | ICD-10-CM | POA: Diagnosis not present

## 2023-11-06 DIAGNOSIS — G4730 Sleep apnea, unspecified: Secondary | ICD-10-CM | POA: Diagnosis not present

## 2023-11-06 DIAGNOSIS — M5412 Radiculopathy, cervical region: Secondary | ICD-10-CM

## 2023-11-06 NOTE — Progress Notes (Signed)
Subjective:    Patient ID: Amber Bridges, female    DOB: 1941/01/02, 83 y.o.   MRN: [id number]  Patient here for  Chief Complaint  Patient presents with   Medical Management of Chronic Issues    Discuss weight management, bp    HPI Here for a scheduled follow up - f/u regarding hypertension and hypercholesterolemia.[facility] cardiology 09/23/23 - f/u [facility] s/p PPM 10/25/22. Stable. Recommended repeat echo. ECHO 09/27/23 - EF >55%, grade I DD, mild MR, mild TR and trivial PR. Reports from a cardiac standpoint, things are stable. No chest pain reported. Breathing stable. Last visit, had prescribed zepbound for weight loss.  Has a history of sleep apnea. Has not been able to get. Discussed weight watchers.    Past Medical History:  Diagnosis Date   Arthritis    Bronchitis    Coronary artery disease    Depression    HOH (hard of hearing)    AIDS   Hypertension    Sleep apnea    CPAP   Past Surgical History:  Procedure Laterality Date   ABDOMINAL HYSTERECTOMY     APPENDECTOMY     BACK SURGERY     X 3   CATARACT EXTRACTION W/ INTRAOCULAR LENS IMPLANT Right 2017   CATARACT EXTRACTION W/PHACO Left 08/08/2016   Procedure: CATARACT EXTRACTION PHACO AND INTRAOCULAR LENS PLACEMENT (IOC);  Surgeon: Sallee Lange, MD;  Location: [facility];  Service: Ophthalmology;  Laterality: Left;  fluid lot # [id number]  exp02/28/2019 [place]    01:15.3 AP%   25.2 CDE   35.06    CTR Right    FOOT SURGERY     JOINT REPLACEMENT     TKR   KNEE ARTHROPLASTY Left 11/24/2018   Procedure: COMPUTER ASSISTED TOTAL KNEE ARTHROPLASTY-LEFT;  Surgeon: Donato Heinz, MD;  Location: [facility];  Service: Orthopedics;  Laterality: Left;   NOSE SURGERY     PACEMAKER INSERTION     PACEMAKER LEADLESS INSERTION  10/25/2022   PACEMAKER LEADLESS INSERTION N/A 10/25/2022   Procedure: PACEMAKER LEADLESS INSERTION;  Surgeon: Marcina Millard, MD;  Location: [facility];  Service: Cardiovascular;  Laterality:  N/A;   RCR Left    SHOULDER ARTHROSCOPY Right    for bursitis   SPINE SURGERY  1973, 1978, 2016   Most recent Laminectomy to T11 and L3   Family History  Problem Relation Age of Onset   Congestive Heart Failure Mother    Diabetes Father    Congestive Heart Failure Father    Congestive Heart Failure Sister    Congestive Heart Failure Brother    Social History   Socioeconomic History   Marital status: Widowed    Spouse name: Not on file   Number of children: Not on file   Years of education: Not on file   Highest education level: Not on file  Occupational History   Not on file  Tobacco Use   Smoking status: Former    Current packs/day: 0.00    Types: Cigarettes    Quit date: 10/08/1960    Years since quitting: 63.1   Smokeless tobacco: Never  Vaping Use   Vaping status: Never Used  Substance and Sexual Activity   Alcohol use: Yes    Comment: 2 cocktails   Drug use: Never   Sexual activity: Not on file  Other Topics Concern   Not on file  Social History Narrative   Not on file   Social Drivers of Health  Financial Resource Strain: Not on file  Food Insecurity: No Food Insecurity (10/25/2022)   Hunger Vital Sign    Worried About Running Out of Food in the Last Year: Never true    Ran Out of Food in the Last Year: Never true  Transportation Needs: No Transportation Needs (10/25/2022)   PRAPARE - Administrator, Civil Service (Medical): No    Lack of Transportation (Non-Medical): No  Physical Activity: Not on file  Stress: Not on file  Social Connections: Not on file     Review of Systems  Constitutional:  Negative for appetite change and unexpected weight change.  HENT:  Negative for congestion and sinus pressure.   Respiratory:  Negative for cough, chest tightness and shortness of breath.   Cardiovascular:  Negative for chest pain and palpitations.  Gastrointestinal:  Negative for abdominal pain, diarrhea, nausea and vomiting.  Genitourinary:   Negative for difficulty urinating and dysuria.  Musculoskeletal:  Negative for joint swelling and myalgias.  Skin:  Negative for color change and rash.  Neurological:  Negative for dizziness and headaches.  Psychiatric/Behavioral:  Negative for agitation and dysphoric mood.        Objective:     BP 132/70   Pulse (!) 56   Temp 97.9 F (36.6 C)   Ht 5' 7.5" (1.715 m)   Wt 243 lb 12.8 oz (110.6 kg)   SpO2 96%   BMI 37.62 kg/m  Wt Readings from Last 3 Encounters:  11/06/23 243 lb 12.8 oz (110.6 kg)  10/16/23 244 lb (110.7 kg)  09/09/23 244 lb (110.7 kg)    Physical Exam Vitals reviewed.  Constitutional:      General: She is not in acute distress.    Appearance: Normal appearance.  HENT:     Head: Normocephalic and atraumatic.     Right Ear: External ear normal.     Left Ear: External ear normal.     Mouth/Throat:     Pharynx: No oropharyngeal exudate or posterior oropharyngeal erythema.  Eyes:     General: No scleral icterus.       Right eye: No discharge.        Left eye: No discharge.     Conjunctiva/sclera: Conjunctivae normal.  Neck:     Thyroid: No thyromegaly.  Cardiovascular:     Rate and Rhythm: Normal rate and regular rhythm.  Pulmonary:     Effort: No respiratory distress.     Breath sounds: Normal breath sounds. No wheezing.  Abdominal:     General: Bowel sounds are normal.     Palpations: Abdomen is soft.     Tenderness: There is no abdominal tenderness.  Musculoskeletal:        General: No swelling or tenderness.     Cervical back: Neck supple. No tenderness.  Lymphadenopathy:     Cervical: No cervical adenopathy.  Skin:    Findings: No erythema or rash.  Neurological:     Mental Status: She is alert.  Psychiatric:        Mood and Affect: Mood normal.        Behavior: Behavior normal.         Outpatient Encounter Medications as of 11/06/2023  Medication Sig   acetaminophen (TYLENOL) 500 MG tablet Take 500 mg by mouth every 6 (six)  hours as needed.   amLODipine (NORVASC) 5 MG tablet Take 1 tablet (5 mg total) by mouth 2 (two) times daily.   aspirin 81 MG EC tablet Take  81 mg by mouth daily.   atorvastatin (LIPITOR) 40 MG tablet Take 1 tablet (40 mg total) by mouth at bedtime. TAKE 1 TABLET(40 MG) BY MOUTH AT BEDTIME   Coenzyme Q10 (COQ10) 200 MG CAPS Take 1 capsule by mouth daily.   Doxylamine Succinate, Sleep, (SLEEP AID PO) Take 1 tablet by mouth at bedtime as needed (sleep).   FLUoxetine (PROZAC) 40 MG capsule TAKE 1 CAPSULE(40 MG) BY MOUTH DAILY   hydrochlorothiazide (HYDRODIURIL) 25 MG tablet TAKE 1 TABLET(25 MG) BY MOUTH EVERY EVENING   ketoconazole (NIZORAL) 2 % cream Apply topically.   losartan (COZAAR) 100 MG tablet TAKE 1 TABLET(100 MG) BY MOUTH DAILY   Multiple Vitamins-Minerals (CENTRUM SILVER PO) Take 1 tablet by mouth daily.   nystatin cream (MYCOSTATIN) Apply 1 application topically 2 (two) times daily.   Polyethylene Glycol 3350 (MIRALAX PO) Take 17 g by mouth daily as needed (constipation).   senna (SENOKOT) 8.6 MG tablet Take 1 tablet by mouth daily.   tirzepatide (ZEPBOUND) 2.5 MG/0.5ML injection vial Inject 2.5 mg into the skin once a week. (Patient not taking: Reported on 11/06/2023)   No facility-administered encounter medications on file as of 11/06/2023.     Lab Results  Component Value Date   WBC 5.7 10/28/2023   HGB 12.8 10/28/2023   HCT 38.7 10/28/2023   PLT 221.0 10/28/2023   GLUCOSE 137 (H) 10/28/2023   CHOL 152 10/28/2023   TRIG 99.0 10/28/2023   HDL 60.90 10/28/2023   LDLCALC 71 10/28/2023   ALT 15 10/28/2023   AST 18 10/28/2023   NA 138 10/28/2023   K 4.1 10/28/2023   CL 103 10/28/2023   CREATININE 1.29 (H) 10/28/2023   BUN 28 (H) 10/28/2023   CO2 26 10/28/2023   TSH 2.75 06/24/2023   INR 0.96 11/12/2018   HGBA1C 6.1 10/28/2023    US Renal Result Date: 08/26/2023 CLINICAL DATA:  decreased gfr EXAM: RENAL / URINARY TRACT ULTRASOUND COMPLETE COMPARISON:  None Available.  FINDINGS: Right Kidney: Renal measurements: 11.5 x 4.5 x 6.4 cm = volume: 171 mL. Echogenicity within normal limits. No hydronephrosis visualized. Benign cyst of the inferior pole measuring up to 3 cm (for which no dedicated imaging follow-up is recommended). Left Kidney: Renal measurements: 8.3 x 4.6 x 4.7 cm = volume: 95 mL. Echogenicity within normal limits. No mass or hydronephrosis visualized. Mild cortical thinning. Bladder: Appears normal for degree of bladder distention. Other: None. IMPRESSION: 1. No hydronephrosis. Electronically Signed   By: Meda Klinefelter M.D.   On: 08/26/2023 13:53       Assessment & Plan:  Essential hypertension Assessment & Plan: Blood pressure as outlined. Doing well on losartan, hctz and amlodipine. Follow pressures.  Follow metabolic panel.    Orders: -     Basic metabolic panel; Future  Cervical radiculopathy  Bilateral carotid artery disease, unspecified type [facility]) Assessment & Plan: Carotid Duplex 09/2023 shows 40 to 59% stenosis in the right ICA with 1 to 39% stenosis in the left ICA. Stenosis percentage has increased there has not been a drastic increase in velocities since the previous study done on 09/10/2022 (09/2023 - f/u [facility] - Duplex ultrasound shows <50% stenosis bilaterally. Continue antiplatelet therapy. Follow up in 12 months with duplex ultrasound and physical exam )   Hyperglycemia Assessment & Plan: Low carb diet and exercise.  Follow met b and a1c.   Orders: -     Hemoglobin A1c; Future  Hyperlipidemia, unspecified hyperlipidemia type Assessment & Plan: Continue lipitor.  Low cholesterol diet and exercise.  Follow lipid panel and liver function tests.   Orders: -     Lipid panel; Future -     Hepatic function panel; Future  Stage 3b chronic kidney disease (HCC) Assessment & Plan: Avoid antiinflammatories.  Continue losartan.  Stay hydrated. Follow metabolic panel.    Encounter for weight loss counseling Assessment &  Plan: Unable to get zepbound. Has sleep apnea. F/u to see if we can get her zepbound covered. Follow. Discussed weight watchers.    History of complete heart block Assessment & Plan: Is s/p micra pacer insertion 10/25/22. Since her pacemaker placement, she has felt better overall. Denies increased sob.    Sleep apnea, unspecified type Assessment & Plan: Continue cpap. Has known sleep apnea. Discussed weight loss and treatment options.  Discussed GLP 1 agonist. Discussed possible side effects and contraindications with these medications. Unable to get zepbound. F/u to see if can get covered.        Dale [place], MD

## 2023-11-06 NOTE — Assessment & Plan Note (Signed)
Carotid Duplex 09/2023 shows 40 to 59% stenosis in the right ICA with 1 to 39% stenosis in the left ICA. Stenosis percentage has increased there has not been a drastic increase in velocities since the previous study done on 09/10/2022 (09/2023 - f/u [facility] - Duplex ultrasound shows <50% stenosis bilaterally. Continue antiplatelet therapy. Follow up in 12 months with duplex ultrasound and physical exam )

## 2023-11-10 ENCOUNTER — Encounter: Payer: Self-pay | Admitting: Internal Medicine

## 2023-11-10 NOTE — Assessment & Plan Note (Addendum)
Unable to get zepbound. Has sleep apnea. F/u to see if we can get her zepbound covered. Follow. Discussed weight watchers.

## 2023-11-10 NOTE — Assessment & Plan Note (Signed)
Continue cpap. Has known sleep apnea. Discussed weight loss and treatment options.  Discussed GLP 1 agonist. Discussed possible side effects and contraindications with these medications. Unable to get zepbound. F/u to see if can get covered.

## 2023-11-10 NOTE — Assessment & Plan Note (Signed)
 Continue lipitor.  Low cholesterol diet and exercise.  Follow lipid panel and liver function tests.

## 2023-11-10 NOTE — Assessment & Plan Note (Signed)
 Low carb diet and exercise.  Follow met b and a1c.

## 2023-11-10 NOTE — Assessment & Plan Note (Signed)
Avoid antiinflammatories.  Continue losartan.  Stay hydrated. Follow metabolic panel.

## 2023-11-10 NOTE — Addendum Note (Signed)
Addended by: Charm Barges on: 11/10/2023 08:52 PM   Modules accepted: Level of Service

## 2023-11-10 NOTE — Assessment & Plan Note (Signed)
Blood pressure as outlined. Doing well on losartan, hctz and amlodipine. Follow pressures.  Follow metabolic panel.

## 2023-11-10 NOTE — Assessment & Plan Note (Signed)
Is s/p micra pacer insertion 10/25/22. Since her pacemaker placement, she has felt better overall. Denies increased sob.

## 2024-01-30 ENCOUNTER — Other Ambulatory Visit: Payer: Self-pay | Admitting: Internal Medicine

## 2024-02-01 ENCOUNTER — Other Ambulatory Visit: Payer: Self-pay | Admitting: Internal Medicine

## 2024-02-11 DIAGNOSIS — I442 Atrioventricular block, complete: Secondary | ICD-10-CM | POA: Diagnosis not present

## 2024-02-20 DIAGNOSIS — Z872 Personal history of diseases of the skin and subcutaneous tissue: Secondary | ICD-10-CM | POA: Diagnosis not present

## 2024-02-20 DIAGNOSIS — L2089 Other atopic dermatitis: Secondary | ICD-10-CM | POA: Diagnosis not present

## 2024-02-20 DIAGNOSIS — Z85828 Personal history of other malignant neoplasm of skin: Secondary | ICD-10-CM | POA: Diagnosis not present

## 2024-02-20 DIAGNOSIS — L218 Other seborrheic dermatitis: Secondary | ICD-10-CM | POA: Diagnosis not present

## 2024-02-20 DIAGNOSIS — L578 Other skin changes due to chronic exposure to nonionizing radiation: Secondary | ICD-10-CM | POA: Diagnosis not present

## 2024-02-20 DIAGNOSIS — L570 Actinic keratosis: Secondary | ICD-10-CM | POA: Diagnosis not present

## 2024-02-20 DIAGNOSIS — Z859 Personal history of malignant neoplasm, unspecified: Secondary | ICD-10-CM | POA: Diagnosis not present

## 2024-02-25 ENCOUNTER — Encounter (INDEPENDENT_AMBULATORY_CARE_PROVIDER_SITE_OTHER): Payer: Self-pay

## 2024-03-04 ENCOUNTER — Other Ambulatory Visit (INDEPENDENT_AMBULATORY_CARE_PROVIDER_SITE_OTHER): Payer: Medicare Other

## 2024-03-04 ENCOUNTER — Ambulatory Visit: Payer: Self-pay | Admitting: Internal Medicine

## 2024-03-04 DIAGNOSIS — R739 Hyperglycemia, unspecified: Secondary | ICD-10-CM | POA: Diagnosis not present

## 2024-03-04 DIAGNOSIS — I10 Essential (primary) hypertension: Secondary | ICD-10-CM

## 2024-03-04 DIAGNOSIS — E785 Hyperlipidemia, unspecified: Secondary | ICD-10-CM | POA: Diagnosis not present

## 2024-03-04 LAB — LIPID PANEL
Cholesterol: 155 mg/dL (ref 0–200)
HDL: 69.2 mg/dL (ref >39.00)
LDL Cholesterol: 72 mg/dL (ref 0–99)
NonHDL: 85.82
Total CHOL/HDL Ratio: 2
Triglycerides: 68 mg/dL (ref 0.0–149.0)
VLDL: 13.6 mg/dL (ref 0.0–40.0)

## 2024-03-04 LAB — BASIC METABOLIC PANEL WITH GFR
BUN: 26 mg/dL — ABNORMAL HIGH (ref 6–23)
CO2: 31 meq/L (ref 19–32)
Calcium: 9.8 mg/dL (ref 8.4–10.5)
Chloride: 104 meq/L (ref 96–112)
Creatinine, Ser: 1.27 mg/dL — ABNORMAL HIGH (ref 0.40–1.20)
GFR: 39.23 mL/min — ABNORMAL LOW (ref >60.00)
Glucose, Bld: 123 mg/dL — ABNORMAL HIGH (ref 70–99)
Potassium: 4.4 meq/L (ref 3.5–5.1)
Sodium: 140 meq/L (ref 135–145)

## 2024-03-04 LAB — HEPATIC FUNCTION PANEL
ALT: 17 U/L (ref 0–35)
AST: 18 U/L (ref 0–37)
Albumin: 4.4 g/dL (ref 3.5–5.2)
Alkaline Phosphatase: 77 U/L (ref 39–117)
Bilirubin, Direct: 0.1 mg/dL (ref 0.0–0.3)
Total Bilirubin: 0.7 mg/dL (ref 0.2–1.2)
Total Protein: 7.5 g/dL (ref 6.0–8.3)

## 2024-03-04 LAB — HEMOGLOBIN A1C: Hgb A1c MFr Bld: 6.2 % (ref 4.6–6.5)

## 2024-03-06 ENCOUNTER — Encounter: Payer: Self-pay | Admitting: Internal Medicine

## 2024-03-06 ENCOUNTER — Ambulatory Visit (INDEPENDENT_AMBULATORY_CARE_PROVIDER_SITE_OTHER): Payer: Medicare Other | Admitting: Internal Medicine

## 2024-03-06 VITALS — BP 136/56 | HR 73 | Ht 67.5 in | Wt 244.4 lb

## 2024-03-06 DIAGNOSIS — Z8679 Personal history of other diseases of the circulatory system: Secondary | ICD-10-CM | POA: Diagnosis not present

## 2024-03-06 DIAGNOSIS — R739 Hyperglycemia, unspecified: Secondary | ICD-10-CM | POA: Diagnosis not present

## 2024-03-06 DIAGNOSIS — E785 Hyperlipidemia, unspecified: Secondary | ICD-10-CM | POA: Diagnosis not present

## 2024-03-06 DIAGNOSIS — I779 Disorder of arteries and arterioles, unspecified: Secondary | ICD-10-CM

## 2024-03-06 DIAGNOSIS — I10 Essential (primary) hypertension: Secondary | ICD-10-CM | POA: Diagnosis not present

## 2024-03-06 DIAGNOSIS — M79641 Pain in right hand: Secondary | ICD-10-CM | POA: Diagnosis not present

## 2024-03-06 DIAGNOSIS — N1832 Chronic kidney disease, stage 3b: Secondary | ICD-10-CM

## 2024-03-06 DIAGNOSIS — Z95811 Presence of heart assist device: Secondary | ICD-10-CM

## 2024-03-06 DIAGNOSIS — M79643 Pain in unspecified hand: Secondary | ICD-10-CM | POA: Insufficient documentation

## 2024-03-06 DIAGNOSIS — G4730 Sleep apnea, unspecified: Secondary | ICD-10-CM

## 2024-03-06 MED ORDER — ATORVASTATIN CALCIUM 40 MG PO TABS: 40.0000 mg | ORAL_TABLET | Freq: Every day | ORAL | 3 refills | Status: DC

## 2024-03-06 MED ORDER — HYDROCHLOROTHIAZIDE 25 MG PO TABS: ORAL_TABLET | ORAL | 3 refills | Status: DC

## 2024-03-06 NOTE — Progress Notes (Signed)
 Subjective:    Patient ID: Amber Bridges, female    DOB: 1941-04-29, 83 y.o.   MRN: [id number]  Patient here for  Chief Complaint  Patient presents with   Medical Management of Chronic Issues    4 month follow up     HPI Here for a scheduled follow up - f/u regarding hypertension and hypercholesterolemia.Gwynne Less cardiology 09/23/23 - f/u [facility] s/p PPM 10/25/22. Stable. Recommended repeat echo. ECHO 09/27/23 - EF >55%, grade I DD, mild MR, mild TR and trivial PR. Reports from a cardiac standpoint, things are stable. Feels breathing overall stable. Taking miralax prn. Controlling her bowels. She is having increased problems with her right hand/fingers. Right third and fourth finger - unable to open hand in am. Has to work her fingers open. Also pain- base of right thumb. Has seen Dr Celestino Cole previously for left hand.    Past Medical History:  Diagnosis Date   Arthritis    Bronchitis    Coronary artery disease    Depression    HOH (hard of hearing)    AIDS   Hypertension    Sleep apnea    CPAP   Past Surgical History:  Procedure Laterality Date   ABDOMINAL HYSTERECTOMY     APPENDECTOMY     BACK SURGERY     X 3   CATARACT EXTRACTION W/ INTRAOCULAR LENS IMPLANT Right 2017   CATARACT EXTRACTION W/PHACO Left 08/08/2016   Procedure: CATARACT EXTRACTION PHACO AND INTRAOCULAR LENS PLACEMENT (IOC);  Surgeon: Steven Dingeldein, MD;  Location: [facility];  Service: Ophthalmology;  Laterality: Left;  fluid lot # [id number]  exp02/28/2019 US     01:15.3 AP%   25.2 CDE   35.06    CTR Right    FOOT SURGERY     JOINT REPLACEMENT     TKR   KNEE ARTHROPLASTY Left 11/24/2018   Procedure: COMPUTER ASSISTED TOTAL KNEE ARTHROPLASTY-LEFT;  Surgeon: Arlyne Lame, MD;  Location: [facility];  Service: Orthopedics;  Laterality: Left;   NOSE SURGERY     PACEMAKER INSERTION     PACEMAKER LEADLESS INSERTION  10/25/2022   PACEMAKER LEADLESS INSERTION N/A 10/25/2022   Procedure: PACEMAKER LEADLESS  INSERTION;  Surgeon: Percival Brace, MD;  Location: [facility];  Service: Cardiovascular;  Laterality: N/A;   RCR Left    SHOULDER ARTHROSCOPY Right    for bursitis   SPINE SURGERY  1973, 1978, 2016   Most recent Laminectomy to T11 and L3   Family History  Problem Relation Age of Onset   Congestive Heart Failure Mother    Diabetes Father    Congestive Heart Failure Father    Congestive Heart Failure Sister    Congestive Heart Failure Brother    Social History   Socioeconomic History   Marital status: Widowed    Spouse name: Not on file   Number of children: Not on file   Years of education: Not on file   Highest education level: Not on file  Occupational History   Not on file  Tobacco Use   Smoking status: Former    Current packs/day: 0.00    Types: Cigarettes    Quit date: 10/08/1960    Years since quitting: 63.4   Smokeless tobacco: Never  Vaping Use   Vaping status: Never Used  Substance and Sexual Activity   Alcohol use: Yes    Comment: 2 cocktails   Drug use: Never   Sexual activity: Not on file  Other Topics Concern  Not on file  Social History Narrative   Not on file   Social Drivers of Health   Financial Resource Strain: Not on file  Food Insecurity: No Food Insecurity (10/25/2022)   Hunger Vital Sign    Worried About Running Out of Food in the Last Year: Never true    Ran Out of Food in the Last Year: Never true  Transportation Needs: No Transportation Needs (10/25/2022)   PRAPARE - Administrator, Civil Service (Medical): No    Lack of Transportation (Non-Medical): No  Physical Activity: Not on file  Stress: Not on file  Social Connections: Not on file     Review of Systems  Constitutional:  Negative for appetite change and unexpected weight change.  HENT:  Negative for congestion and sinus pressure.   Respiratory:  Negative for cough, chest tightness and shortness of breath.   Cardiovascular:  Negative for chest pain,  palpitations and leg swelling.  Gastrointestinal:  Negative for abdominal pain, diarrhea, nausea and vomiting.  Genitourinary:  Negative for difficulty urinating and dysuria.  Musculoskeletal:  Negative for myalgias.       Hand pain as outlined.   Skin:  Negative for color change and rash.  Neurological:  Negative for dizziness and headaches.  Psychiatric/Behavioral:  Negative for agitation and dysphoric mood.        Objective:     BP (!) 136/56   Pulse 73   Ht 5' 7.5" (1.715 m)   Wt 244 lb 6.4 oz (110.9 kg)   SpO2 96%   BMI 37.71 kg/m  Wt Readings from Last 3 Encounters:  03/06/24 244 lb 6.4 oz (110.9 kg)  11/06/23 243 lb 12.8 oz (110.6 kg)  10/16/23 244 lb (110.7 kg)    Physical Exam Vitals reviewed.  Constitutional:      General: She is not in acute distress.    Appearance: Normal appearance.  HENT:     Head: Normocephalic and atraumatic.     Right Ear: External ear normal.     Left Ear: External ear normal.     Mouth/Throat:     Pharynx: No oropharyngeal exudate or posterior oropharyngeal erythema.  Eyes:     General: No scleral icterus.       Right eye: No discharge.        Left eye: No discharge.     Conjunctiva/sclera: Conjunctivae normal.  Neck:     Thyroid : No thyromegaly.  Cardiovascular:     Rate and Rhythm: Normal rate and regular rhythm.  Pulmonary:     Effort: No respiratory distress.     Breath sounds: Normal breath sounds. No wheezing.  Abdominal:     General: Bowel sounds are normal.     Palpations: Abdomen is soft.     Tenderness: There is no abdominal tenderness.  Musculoskeletal:        General: No swelling or tenderness.     Cervical back: Neck supple. No tenderness.     Comments: Increased pain - third and fourth - fingers - extending into hand. Increased pain with palpatoin base of right thumb.   Lymphadenopathy:     Cervical: No cervical adenopathy.  Skin:    Findings: No erythema or rash.  Neurological:     Mental Status: She is  alert.  Psychiatric:        Mood and Affect: Mood normal.        Behavior: Behavior normal.         Outpatient Encounter Medications  as of 03/06/2024  Medication Sig   acetaminophen  (TYLENOL ) 500 MG tablet Take 500 mg by mouth every 6 (six) hours as needed.   amLODipine  (NORVASC ) 5 MG tablet TAKE 1 TABLET(5 MG) BY MOUTH TWICE DAILY   aspirin 81 MG EC tablet Take 81 mg by mouth daily.   Coenzyme Q10 (COQ10) 200 MG CAPS Take 1 capsule by mouth daily.   Doxylamine  Succinate, Sleep, (SLEEP AID PO) Take 1 tablet by mouth at bedtime as needed (sleep).   FLUoxetine  (PROZAC ) 40 MG capsule TAKE 1 CAPSULE(40 MG) BY MOUTH DAILY   ketoconazole (NIZORAL) 2 % cream Apply topically.   losartan  (COZAAR ) 100 MG tablet TAKE 1 TABLET(100 MG) BY MOUTH DAILY   Multiple Vitamins-Minerals (CENTRUM SILVER PO) Take 1 tablet by mouth daily.   nystatin  cream (MYCOSTATIN ) Apply 1 application topically 2 (two) times daily.   Polyethylene Glycol 3350 (MIRALAX PO) Take 17 g by mouth daily as needed (constipation).   senna (SENOKOT) 8.6 MG tablet Take 1 tablet by mouth daily.   [DISCONTINUED] atorvastatin  (LIPITOR) 40 MG tablet Take 1 tablet (40 mg total) by mouth at bedtime. TAKE 1 TABLET(40 MG) BY MOUTH AT BEDTIME   [DISCONTINUED] hydrochlorothiazide  (HYDRODIURIL ) 25 MG tablet TAKE 1 TABLET(25 MG) BY MOUTH EVERY EVENING   atorvastatin  (LIPITOR) 40 MG tablet Take 1 tablet (40 mg total) by mouth at bedtime. TAKE 1 TABLET(40 MG) BY MOUTH AT BEDTIME   hydrochlorothiazide  (HYDRODIURIL ) 25 MG tablet TAKE 1 TABLET(25 MG) BY MOUTH EVERY EVENING   [DISCONTINUED] tirzepatide  (ZEPBOUND ) 2.5 MG/0.5ML injection vial Inject 2.5 mg into the skin once a week. (Patient not taking: Reported on 03/06/2024)   No facility-administered encounter medications on file as of 03/06/2024.     Lab Results  Component Value Date   WBC 5.7 10/28/2023   HGB 12.8 10/28/2023   HCT 38.7 10/28/2023   PLT 221.0 10/28/2023   GLUCOSE 123 (H)  03/04/2024   CHOL 155 03/04/2024   TRIG 68.0 03/04/2024   HDL 69.20 03/04/2024   LDLCALC 72 03/04/2024   ALT 17 03/04/2024   AST 18 03/04/2024   NA 140 03/04/2024   K 4.4 03/04/2024   CL 104 03/04/2024   CREATININE 1.27 (H) 03/04/2024   BUN 26 (H) 03/04/2024   CO2 31 03/04/2024   TSH 2.75 06/24/2023   INR 0.96 11/12/2018   HGBA1C 6.2 03/04/2024    US  Renal Result Date: 08/26/2023 CLINICAL DATA:  decreased gfr EXAM: RENAL / URINARY TRACT ULTRASOUND COMPLETE COMPARISON:  None Available. FINDINGS: Right Kidney: Renal measurements: 11.5 x 4.5 x 6.4 cm = volume: 171 mL. Echogenicity within normal limits. No hydronephrosis visualized. Benign cyst of the inferior pole measuring up to 3 cm (for which no dedicated imaging follow-up is recommended). Left Kidney: Renal measurements: 8.3 x 4.6 x 4.7 cm = volume: 95 mL. Echogenicity within normal limits. No mass or hydronephrosis visualized. Mild cortical thinning. Bladder: Appears normal for degree of bladder distention. Other: None. IMPRESSION: 1. No hydronephrosis. Electronically Signed   By: Clancy Crimes M.D.   On: 08/26/2023 13:53       Assessment & Plan:  Essential hypertension Assessment & Plan: Blood pressure as outlined. Doing well on losartan , hctz and amlodipine . Follow pressures. Follow metabolic panel.   Orders: -     Basic metabolic panel with GFR; Future  Hyperlipidemia, unspecified hyperlipidemia type Assessment & Plan: Continue lipitor.  Low cholesterol diet and exercise.  Check lipid panel with next fasting labs.   Orders: -  Lipid panel; Future -     Hepatic function panel; Future  Hyperglycemia Assessment & Plan: Low carb diet and exercise. Follow met b and A1c.   Orders: -     Basic metabolic panel with GFR; Future -     Hemoglobin A1c; Future  Pain of right hand Assessment & Plan: Increased pain/ trigger fingers and pain - base of right thumb. Refer to Dr Celestino Cole for further evaluation and treatment.    Orders: -     Ambulatory referral to Orthopedic Surgery  Bilateral carotid artery disease, unspecified type [facility]) Assessment & Plan: Carotid Duplex 09/2023 shows 40 to 59% stenosis in the right ICA with 1 to 39% stenosis in the left ICA. Stenosis percentage has increased there has not been a drastic increase in velocities since the previous study done on 09/10/2022 (09/2023 - f/u [facility] - Duplex ultrasound shows <50% stenosis bilaterally. Continue antiplatelet therapy. Follow up in 12 months with duplex ultrasound and physical exam )   Stage 3b chronic kidney disease (HCC) Assessment & Plan: Avoid antiinflammatories.  Continue losartan .  Stay hydrated. Follow metabolic panel.    History of complete heart block Assessment & Plan: Is s/p micra pacer insertion 10/25/22. Since her pacemaker placement, she has felt better overall. Denies increased sob.    Sleep apnea, unspecified type Assessment & Plan: Continue cpap.    Presence of heart assist device [facility]) Assessment & Plan: S/p PPM as outlined.    Other orders -     Atorvastatin  Calcium ; Take 1 tablet (40 mg total) by mouth at bedtime. TAKE 1 TABLET(40 MG) BY MOUTH AT BEDTIME  Dispense: 90 tablet; Refill: 3 -     hydroCHLOROthiazide ; TAKE 1 TABLET(25 MG) BY MOUTH EVERY EVENING  Dispense: 90 tablet; Refill: 3     Dellar Fenton, MD

## 2024-03-08 ENCOUNTER — Encounter: Payer: Self-pay | Admitting: Internal Medicine

## 2024-03-08 DIAGNOSIS — Z95811 Presence of heart assist device: Secondary | ICD-10-CM | POA: Insufficient documentation

## 2024-03-08 NOTE — Assessment & Plan Note (Signed)
Blood pressure as outlined. Doing well on losartan, hctz and amlodipine. Follow pressures.  Follow metabolic panel.

## 2024-03-08 NOTE — Assessment & Plan Note (Signed)
 S/p PPM as outlined.

## 2024-03-08 NOTE — Assessment & Plan Note (Signed)
 Increased pain/ trigger fingers and pain - base of right thumb. Refer to Dr Celestino Cole for further evaluation and treatment.

## 2024-03-08 NOTE — Assessment & Plan Note (Signed)
 Continue cpap.

## 2024-03-08 NOTE — Assessment & Plan Note (Signed)
 Avoid antiinflammatories.  Continue losartan.  Stay hydrated. Follow metabolic panel.

## 2024-03-08 NOTE — Assessment & Plan Note (Signed)
 Low-carb diet and exercise.  Follow met b and A1c.

## 2024-03-08 NOTE — Assessment & Plan Note (Signed)
 Continue lipitor.  Low cholesterol diet and exercise.  Check lipid panel with next fasting labs.

## 2024-03-08 NOTE — Assessment & Plan Note (Signed)
 Is s/p micra pacer insertion 10/25/22. Since her pacemaker placement, she has felt better overall. Denies increased sob.

## 2024-03-08 NOTE — Assessment & Plan Note (Signed)
 Carotid Duplex 09/2023 shows 40 to 59% stenosis in the right ICA with 1 to 39% stenosis in the left ICA. Stenosis percentage has increased there has not been a drastic increase in velocities since the previous study done on 09/10/2022 (09/2023 - f/u [facility] - Duplex ultrasound shows <50% stenosis bilaterally. Continue antiplatelet therapy. Follow up in 12 months with duplex ultrasound and physical exam )

## 2024-03-09 DIAGNOSIS — M65341 Trigger finger, right ring finger: Secondary | ICD-10-CM | POA: Diagnosis not present

## 2024-03-09 DIAGNOSIS — M65331 Trigger finger, right middle finger: Secondary | ICD-10-CM | POA: Diagnosis not present

## 2024-03-24 DIAGNOSIS — M65341 Trigger finger, right ring finger: Secondary | ICD-10-CM | POA: Diagnosis not present

## 2024-03-24 DIAGNOSIS — M65331 Trigger finger, right middle finger: Secondary | ICD-10-CM | POA: Diagnosis not present

## 2024-03-24 DIAGNOSIS — M65321 Trigger finger, right index finger: Secondary | ICD-10-CM | POA: Diagnosis not present

## 2024-03-24 DIAGNOSIS — M720 Palmar fascial fibromatosis [Dupuytren]: Secondary | ICD-10-CM | POA: Diagnosis not present

## 2024-03-25 DIAGNOSIS — M65341 Trigger finger, right ring finger: Secondary | ICD-10-CM | POA: Diagnosis not present

## 2024-03-25 DIAGNOSIS — M65331 Trigger finger, right middle finger: Secondary | ICD-10-CM | POA: Diagnosis not present

## 2024-03-25 DIAGNOSIS — M545 Low back pain, unspecified: Secondary | ICD-10-CM | POA: Diagnosis not present

## 2024-03-27 DIAGNOSIS — Z87891 Personal history of nicotine dependence: Secondary | ICD-10-CM | POA: Diagnosis not present

## 2024-03-27 DIAGNOSIS — Z950 Presence of cardiac pacemaker: Secondary | ICD-10-CM | POA: Diagnosis not present

## 2024-03-27 DIAGNOSIS — N1832 Chronic kidney disease, stage 3b: Secondary | ICD-10-CM | POA: Diagnosis not present

## 2024-03-27 DIAGNOSIS — E785 Hyperlipidemia, unspecified: Secondary | ICD-10-CM | POA: Diagnosis not present

## 2024-03-27 DIAGNOSIS — I10 Essential (primary) hypertension: Secondary | ICD-10-CM | POA: Diagnosis not present

## 2024-03-27 DIAGNOSIS — I6523 Occlusion and stenosis of bilateral carotid arteries: Secondary | ICD-10-CM | POA: Diagnosis not present

## 2024-03-27 DIAGNOSIS — I517 Cardiomegaly: Secondary | ICD-10-CM | POA: Diagnosis not present

## 2024-03-27 DIAGNOSIS — G4733 Obstructive sleep apnea (adult) (pediatric): Secondary | ICD-10-CM | POA: Diagnosis not present

## 2024-03-27 DIAGNOSIS — I340 Nonrheumatic mitral (valve) insufficiency: Secondary | ICD-10-CM | POA: Diagnosis not present

## 2024-03-27 DIAGNOSIS — I442 Atrioventricular block, complete: Secondary | ICD-10-CM | POA: Diagnosis not present

## 2024-03-27 DIAGNOSIS — Z7982 Long term (current) use of aspirin: Secondary | ICD-10-CM | POA: Diagnosis not present

## 2024-03-30 ENCOUNTER — Other Ambulatory Visit: Payer: Self-pay | Admitting: Internal Medicine

## 2024-03-30 ENCOUNTER — Other Ambulatory Visit: Payer: Self-pay | Admitting: Physician Assistant

## 2024-03-30 DIAGNOSIS — M545 Low back pain, unspecified: Secondary | ICD-10-CM

## 2024-04-07 ENCOUNTER — Telehealth: Payer: Self-pay

## 2024-04-07 ENCOUNTER — Encounter: Payer: Self-pay | Admitting: Emergency Medicine

## 2024-04-07 ENCOUNTER — Emergency Department
Admission: EM | Admit: 2024-04-07 | Discharge: 2024-04-07 | Disposition: A | Discharge status: Home / Self Care | Attending: Emergency Medicine | Admitting: Emergency Medicine

## 2024-04-07 ENCOUNTER — Emergency Department

## 2024-04-07 ENCOUNTER — Other Ambulatory Visit: Payer: Self-pay

## 2024-04-07 DIAGNOSIS — R531 Weakness: Secondary | ICD-10-CM | POA: Diagnosis not present

## 2024-04-07 DIAGNOSIS — R29818 Other symptoms and signs involving the nervous system: Secondary | ICD-10-CM | POA: Diagnosis not present

## 2024-04-07 DIAGNOSIS — I10 Essential (primary) hypertension: Secondary | ICD-10-CM | POA: Diagnosis not present

## 2024-04-07 DIAGNOSIS — R112 Nausea with vomiting, unspecified: Secondary | ICD-10-CM | POA: Insufficient documentation

## 2024-04-07 DIAGNOSIS — E860 Dehydration: Secondary | ICD-10-CM | POA: Diagnosis not present

## 2024-04-07 DIAGNOSIS — I2510 Atherosclerotic heart disease of native coronary artery without angina pectoris: Secondary | ICD-10-CM | POA: Diagnosis not present

## 2024-04-07 LAB — URINALYSIS, ROUTINE W REFLEX MICROSCOPIC
Bilirubin Urine: NEGATIVE
Glucose, UA: NEGATIVE mg/dL
Hgb urine dipstick: NEGATIVE
Ketones, ur: 5 mg/dL — AB
Leukocytes,Ua: NEGATIVE
Nitrite: NEGATIVE
Protein, ur: NEGATIVE mg/dL
Specific Gravity, Urine: 1.008 (ref 1.005–1.030)
pH: 7 (ref 5.0–8.0)

## 2024-04-07 LAB — COMPREHENSIVE METABOLIC PANEL WITH GFR
ALT: 17 U/L (ref 0–44)
AST: 23 U/L (ref 15–41)
Albumin: 4 g/dL (ref 3.5–5.0)
Alkaline Phosphatase: 85 U/L (ref 38–126)
Anion gap: 13 (ref 5–15)
BUN: 25 mg/dL — ABNORMAL HIGH (ref 8–23)
CO2: 21 mmol/L — ABNORMAL LOW (ref 22–32)
Calcium: 9.4 mg/dL (ref 8.9–10.3)
Chloride: 106 mmol/L (ref 98–111)
Creatinine, Ser: 1.32 mg/dL — ABNORMAL HIGH (ref 0.44–1.00)
GFR, Estimated: 40 mL/min — ABNORMAL LOW (ref >60)
Glucose, Bld: 146 mg/dL — ABNORMAL HIGH (ref 70–99)
Potassium: 4.1 mmol/L (ref 3.5–5.1)
Sodium: 140 mmol/L (ref 135–145)
Total Bilirubin: 1 mg/dL (ref 0.0–1.2)
Total Protein: 7.4 g/dL (ref 6.5–8.1)

## 2024-04-07 LAB — CBC
HCT: 38.7 % (ref 36.0–46.0)
Hemoglobin: 12.7 g/dL (ref 12.0–15.0)
MCH: 31.4 pg (ref 26.0–34.0)
MCHC: 32.8 g/dL (ref 30.0–36.0)
MCV: 95.6 fL (ref 80.0–100.0)
Platelets: 204 10*3/uL (ref 150–400)
RBC: 4.05 MIL/uL (ref 3.87–5.11)
RDW: 12.6 % (ref 11.5–15.5)
WBC: 6.9 10*3/uL (ref 4.0–10.5)
nRBC: 0 % (ref 0.0–0.2)

## 2024-04-07 LAB — LIPASE, BLOOD: Lipase: 30 U/L (ref 11–51)

## 2024-04-07 LAB — TROPONIN I (HIGH SENSITIVITY): Troponin I (High Sensitivity): 5 ng/L (ref <18)

## 2024-04-07 MED ORDER — CYCLOBENZAPRINE HCL 5 MG PO TABS: 5.0000 mg | ORAL_TABLET | Freq: Three times a day (TID) | ORAL | 0 refills | Status: DC | PRN

## 2024-04-07 MED ORDER — SODIUM CHLORIDE 0.9 % IV BOLUS
1000.0000 mL | Freq: Once | INTRAVENOUS | Status: AC
  Administered 2024-04-07: 1000 mL via INTRAVENOUS

## 2024-04-07 MED ORDER — CYCLOBENZAPRINE HCL 10 MG PO TABS
5.0000 mg | ORAL_TABLET | Freq: Once | ORAL | Status: AC
  Administered 2024-04-07: 5 mg via ORAL
  Filled 2024-04-07: qty 1

## 2024-04-07 MED ORDER — LIDOCAINE 5 % EX PTCH
1.0000 | MEDICATED_PATCH | CUTANEOUS | Status: DC
  Administered 2024-04-07: 1 via TRANSDERMAL
  Filled 2024-04-07: qty 1

## 2024-04-07 MED ORDER — LIDOCAINE 5 % EX PTCH: 1.0000 | MEDICATED_PATCH | CUTANEOUS | 0 refills | Status: AC

## 2024-04-07 NOTE — Telephone Encounter (Signed)
 Phone call to Amber Bridges, daughter to follow up on the [facility] message sent.  Health reports that her mother is better today, eye drooping has resolved, BP top number this morning was between 140-150, she could not remember the exact reading. Informed [name] that Dr. Glendia wanted pt to go to ED for evaluation due to the concerning symptoms that she was experiencing. [name] voiced apprehension due to the fact that mother was feeling better talked with her about if this was a true mini stroke there needs to be pieces put into place to prevent further stroke like events.  [name] verbalized understanding.  Also talked with Amber Bridges about the appropriate use of mychart messages for non-urgent messages and getting proxy to send messages from mother's mychart.  [name] verbalized understanding and said that she would call her mother.     See Mychart message that was sent using Amber Bridges, daughter Mychart account.  Copied into pt's chart. Message was sent 04/06/24  9:39 PM   Amber Bridges is my mother. She had an episode tonight I want you aware of. I was not w her. She was having dinner, started feeling bad, very red, went to B-room - nose started bleeding and she started vomiting. They took her blood pressure 167 over 89 - pulse was 59. She was INSTANTLY exhausted and went to sleep. I got there and did the test for stroke and her eyes tracked, she could raise both arms...etc. She refused to allow us  to call EMS or go to hospital. My brother did say he thought one eye looked a tad droopy and when she woke up it was no longer. I am staying w her tonight. I am her medical power of attorney by the way.  I worried mini-stroke, but not really sure. Just a heads up in case she does not share this with you.  Amber Bridges

## 2024-04-07 NOTE — ED Triage Notes (Signed)
 Patient to ED via POV for vomiting. PT reports having N/V during dinner last night. PT reports she had a nose bleed during this last night. No vomiting today but feels tired. States BP has also been elevated since this. Pt took BP meds today as prescribed.

## 2024-04-07 NOTE — Telephone Encounter (Signed)
In ER for evaluation.  ?

## 2024-04-07 NOTE — ED Provider Notes (Signed)
 [facility] Provider Note    Event Date/Time   First MD Initiated Contact with Patient 04/07/24 1645     (approximate)  History   Chief Complaint: Emesis  HPI  Amber Bridges is a 83 y.o. female with a past medical history of CAD, depression, hypertension, arthritis, presents to the emergency department with nausea vomiting and fatigue.  According to the patient and son, yesterday she was eating dinner when she acutely became nauseated had to go the bathroom and vomited multiple times.  Patient states she has a small nosebleed when this happened as well.  Checked her blood pressure afterwards and it was significantly elevated per patient from 170.  Patient denies any further nausea or vomiting, no diarrhea.  No abdominal pain or chest pain.  Patient has been feeling very fatigued today.  She called her doctor who recommended she come to the emergency department for further evaluation.  Son notes at 1 point in the night last time it looked like her eyelid was drooping slightly although he states that did not last long.  Patient denies any neurological complaints last night or today no weakness or numbness of any arm or leg confusion difficulty speaking or thinking.  Physical Exam   Triage Vital Signs: ED Triage Vitals  Encounter Vitals Group     BP 04/07/24 1335 (!) 132/56     Girls Systolic BP Percentile --      Girls Diastolic BP Percentile --      Boys Systolic BP Percentile --      Boys Diastolic BP Percentile --      Pulse Rate 04/07/24 1335 66     Resp 04/07/24 1335 17     Temp 04/07/24 1335 98.4 F (36.9 C)     Temp Source 04/07/24 1335 Oral     SpO2 04/07/24 1335 100 %     Weight 04/07/24 1336 239 lb (108.4 kg)     Height 04/07/24 1336 5' 7.5 (1.715 m)     Head Circumference --      Peak Flow --      Pain Score 04/07/24 1336 2     Pain Loc --      Pain Education --      Exclude from Growth Chart --     Most recent vital signs: Vitals:    04/07/24 1335  BP: (!) 132/56  Pulse: 66  Resp: 17  Temp: 98.4 F (36.9 C)  SpO2: 100%    General: Awake, no distress.  CV:  Good peripheral perfusion.  Regular rate and rhythm  Resp:  Normal effort.  Equal breath sounds bilaterally.  Abd:  No distention.  Soft, nontender.  No rebound or guarding.  ED Results / Procedures / Treatments   RADIOLOGY  I have reviewed and interpreted the CT head images.  No obvious bleed seen on my evaluation. CT read by radiology is negative for acute abnormality.  MEDICATIONS ORDERED IN ED: Medications  sodium chloride  0.9 % bolus 1,000 mL (has no administration in time range)     IMPRESSION / MDM / ASSESSMENT AND PLAN / ED COURSE  I reviewed the triage vital signs and the nursing notes.  Patient's presentation is most consistent with acute presentation with potential threat to life or bodily function.  Patient presents to the emergency department for nausea vomiting that occurred last night weakness today.  Overall the patient appears well.  Lab work today shows reassuring CBC with a normal white blood  cell count, chemistry shows a borderline anion gap of 13 with a slight worsening of her kidney function although not significant.  LFTs and lipase are normal.  Given the patient's acute onset of nausea last night I have added on a troponin as a precaution although patient denies any chest symptoms at any point.  Given the son's complaint that the eyelid did not look completely normal briefly yesterday although no further neurologic complaints at any point we will obtain a CT scan of the head as a precaution.  Patient's main complaint is her blood pressure, currently in the room and is 170 during my evaluation.  Given the patient's mild renal insufficiency/dehydration we will IV hydrate the patient I have added on a troponin we will obtain a CT scan of the head as a precaution and I would like to obtain a urine sample to rule out any type of infectious  etiology.  Patient agreeable to plan of care.  Overall reassuring workup and physical exam thus far.  The remainder of the patient's workup is reassuring as well, normal-appearing urinalysis.  Negative troponin.  CT scan of the head is normal as well.  Given the patient's reassuring workup I believe the patient is safe for discharge home.  She is having increased back pain.  Has a history of chronic back pain that seems to be exacerbated ever since lying on the CT table.  Patient states the Flexeril and Lidoderm  patch helped tremendously.  Will discharge with a short course of both.  I discussed with the patient daughter to be cautious with use of the muscle relaxer.  They are agreeable to plan of care and will follow-up with her doctor.  Discussed return precautions.  FINAL CLINICAL IMPRESSION(S) / ED DIAGNOSES   Weakness Nausea vomiting   Note:  This document was prepared using Dragon voice recognition software and may include unintentional dictation errors.   Dorothyann Drivers, MD 04/07/24 2057

## 2024-04-17 NOTE — Progress Notes (Signed)
  Device system confirmed to be MRI conditional, with implant date > 6 weeks ago, and no evidence of abandoned or epicardial leads in review of most recent CXR  Device last cleared by EP Provider:Brandi Ollis, NP  Clearance is good through for 1 year as long as parameters remain stable at time of check. If pt undergoes a cardiac device procedure during that time, they should be re-cleared.   Tachy-therapies to be programmed off if applicable with device back to pre-MRI settings after completion of exam.  [employer] - Programming recommendation received through [employer] App/Tablet  Suzan Recardo Arna Debby  04/17/2024 2:24 PM

## 2024-04-22 ENCOUNTER — Ambulatory Visit (HOSPITAL_COMMUNITY)
Admission: RE | Admit: 2024-04-22 | Discharge: 2024-04-22 | Disposition: A | Discharge status: Home / Self Care | Source: Ambulatory Visit | Attending: Physician Assistant | Admitting: Physician Assistant

## 2024-04-22 DIAGNOSIS — M545 Low back pain, unspecified: Secondary | ICD-10-CM | POA: Insufficient documentation

## 2024-04-22 DIAGNOSIS — M5136 Other intervertebral disc degeneration, lumbar region with discogenic back pain only: Secondary | ICD-10-CM | POA: Diagnosis not present

## 2024-04-22 DIAGNOSIS — M4807 Spinal stenosis, lumbosacral region: Secondary | ICD-10-CM | POA: Diagnosis not present

## 2024-04-22 DIAGNOSIS — M48061 Spinal stenosis, lumbar region without neurogenic claudication: Secondary | ICD-10-CM | POA: Diagnosis not present

## 2024-04-22 DIAGNOSIS — M47816 Spondylosis without myelopathy or radiculopathy, lumbar region: Secondary | ICD-10-CM | POA: Diagnosis not present

## 2024-04-28 DIAGNOSIS — M545 Low back pain, unspecified: Secondary | ICD-10-CM | POA: Diagnosis not present

## 2024-05-01 ENCOUNTER — Ambulatory Visit
Admission: EM | Admit: 2024-05-01 | Discharge: 2024-05-01 | Disposition: A | Discharge status: Home / Self Care | Attending: Nurse Practitioner | Admitting: Nurse Practitioner

## 2024-05-01 DIAGNOSIS — J209 Acute bronchitis, unspecified: Secondary | ICD-10-CM

## 2024-05-01 MED ORDER — AZITHROMYCIN 500 MG PO TABS: 500.0000 mg | ORAL_TABLET | Freq: Every day | ORAL | 0 refills | Status: AC

## 2024-05-01 MED ORDER — PROMETHAZINE-DM 6.25-15 MG/5ML PO SYRP: 5.0000 mL | ORAL_SOLUTION | Freq: Four times a day (QID) | ORAL | 0 refills | Status: DC | PRN

## 2024-05-01 MED ORDER — PREDNISONE 20 MG PO TABS: 40.0000 mg | ORAL_TABLET | Freq: Every day | ORAL | 0 refills | Status: AC

## 2024-05-01 MED ORDER — ALBUTEROL SULFATE HFA 108 (90 BASE) MCG/ACT IN AERS: 1.0000 | INHALATION_SPRAY | Freq: Four times a day (QID) | RESPIRATORY_TRACT | 0 refills | Status: DC | PRN

## 2024-05-01 MED ORDER — MUCINEX DM MAXIMUM STRENGTH 60-1200 MG PO TB12: 1.0000 | ORAL_TABLET | Freq: Two times a day (BID) | ORAL | 0 refills | Status: DC

## 2024-05-01 NOTE — ED Provider Notes (Signed)
 Amber Bridges    CSN: [id number] Arrival date & time: 05/01/24  1418      History   Chief Complaint Chief Complaint  Patient presents with   Nasal Congestion   Cough   Wheezing    HPI Amber Bridges is a 83 y.o. female.   Discussed the use of AI scribe software for clinical note transcription with the patient, who gave verbal consent to proceed.   The patient presents with a respiratory infection that started approximately 10 days ago. She initially experienced nasal congestion and rhinorrhea, which she initially thought was allergies. The symptoms progressed to involve her lungs, accompanied by a fever up to 102F that lasted for about a week before resolving 3 days ago. The patient reports coughing up yellow phlegm and experiencing wheezing, which she describes as rattling. She denies any history of lung issues or COPD. The patient has been self-treating with over-the-counter cold medications, initially using Robitussin DM then switching to a [facility] brand medication. She notes improvement in her symptoms since changing medications, particularly the resolution of her fever. The patient's sore throat has improved, though she continues to have a persistent cough.  The patient denies smoking or vaping. She uses a CPAP machine regularly. The patient reports no allergies to antibiotics and denies being diabetic.  The following portions of the patient's history were reviewed and updated as appropriate: allergies, current medications, past family history, past medical history, past social history, past surgical history, and problem list.    Past Medical History:  Diagnosis Date   Arthritis    Bronchitis    Coronary artery disease    Depression    HOH (hard of hearing)    AIDS   Hypertension    Sleep apnea    CPAP    Patient Active Problem List   Diagnosis Date Noted   Presence of heart assist device (HCC) 03/08/2024   Hand pain 03/06/2024   Encounter for  weight loss counseling 10/20/2023   Elbow pain, right 06/30/2023   Carpal tunnel syndrome 01/31/2023   History of complete heart block 10/25/2022   Bradycardia 10/17/2022   Neck pain 08/15/2022   Cervical radiculopathy 08/13/2022   Moderate mitral insufficiency 04/05/2022   Swelling of lower extremity 02/19/2022   Healthcare maintenance 02/19/2022   LBBB (left bundle branch block) 02/19/2022   Stable angina pectoris (HCC) 02/19/2022   SOB (shortness of breath) 02/15/2022   Low back pain 10/16/2021   Low back strain 07/23/2021   Ankle swelling 02/11/2021   Right knee pain 08/07/2020   Trigger finger 08/07/2020   Hyperglycemia 08/02/2020   Numbness of left hand 06/19/2020   Mild depression 10/31/2019   CKD (chronic kidney disease) stage 3, GFR 30-59 ml/min (HCC) 12/17/2018   Total knee replacement status 11/24/2018   Bilateral cataracts 10/15/2018   Situational anxiety 09/08/2018   Primary osteoarthritis of left knee 08/03/2018   Hyperlipidemia 03/10/2018   Essential hypertension 03/10/2018   Carotid artery disease (HCC) 03/10/2018   Status post total right knee replacement 02/05/2016   Borderline diabetes mellitus 01/05/2016   History of depression 04/27/2015   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   Adiposity 04/27/2015   Sleep apnea 04/27/2015   Urinary retention 04/27/2015    Past Surgical History:  Procedure Laterality Date   ABDOMINAL HYSTERECTOMY     APPENDECTOMY     BACK SURGERY     X 3   CATARACT EXTRACTION W/ INTRAOCULAR LENS IMPLANT Right 2017   CATARACT  EXTRACTION W/PHACO Left 08/08/2016   Procedure: CATARACT EXTRACTION PHACO AND INTRAOCULAR LENS PLACEMENT (IOC);  Surgeon: Steven Dingeldein, MD;  Location: [facility];  Service: Ophthalmology;  Laterality: Left;  fluid lot # [id number]  exp02/28/2019 US     01:15.3 AP%   25.2 CDE   35.06    CTR Right    FOOT SURGERY     JOINT REPLACEMENT     TKR   KNEE ARTHROPLASTY Left 11/24/2018   Procedure:  COMPUTER ASSISTED TOTAL KNEE ARTHROPLASTY-LEFT;  Surgeon: Mardee Lynwood SQUIBB, MD;  Location: [facility];  Service: Orthopedics;  Laterality: Left;   NOSE SURGERY     PACEMAKER INSERTION     PACEMAKER LEADLESS INSERTION  10/25/2022   PACEMAKER LEADLESS INSERTION N/A 10/25/2022   Procedure: PACEMAKER LEADLESS INSERTION;  Surgeon: Ammon Blunt, MD;  Location: [facility];  Service: Cardiovascular;  Laterality: N/A;   RCR Left    SHOULDER ARTHROSCOPY Right    for bursitis   SPINE SURGERY  1973, 1978, 2016   Most recent Laminectomy to T11 and L3    OB History   No obstetric history on file.      Home Medications    Prior to Admission medications   Medication Sig Start Date End Date Taking? Authorizing Provider  albuterol (VENTOLIN HFA) 108 (90 Base) MCG/ACT inhaler Inhale 1-2 puffs into the lungs every 6 (six) hours as needed for wheezing or shortness of breath. 05/01/24  Yes Iola Lukes, FNP  azithromycin  (ZITHROMAX ) 500 MG tablet Take 1 tablet (500 mg total) by mouth daily for 5 days. 05/01/24 05/06/24 Yes Reiley Keisler, Lukes, FNP  Dextromethorphan-guaiFENesin (MUCINEX DM MAXIMUM STRENGTH) 60-1200 MG TB12 Take 1 tablet by mouth 2 (two) times daily. 05/01/24  Yes Iola Lukes, FNP  predniSONE (DELTASONE) 20 MG tablet Take 2 tablets (40 mg total) by mouth daily for 5 days. 05/01/24 05/06/24 Yes Iola Lukes, FNP  promethazine-dextromethorphan (PROMETHAZINE-DM) 6.25-15 MG/5ML syrup Take 5 mLs by mouth every 6 (six) hours as needed for cough. 05/01/24  Yes Iola Lukes, FNP  acetaminophen  (TYLENOL ) 500 MG tablet Take 500 mg by mouth every 6 (six) hours as needed.    [provider]  amLODipine  (NORVASC ) 5 MG tablet TAKE 1 TABLET(5 MG) BY MOUTH TWICE DAILY 02/04/24   Glendia Shad, MD  aspirin 81 MG EC tablet Take 81 mg by mouth daily.    [provider]  atorvastatin  (LIPITOR) 40 MG tablet Take 1 tablet (40 mg total) by mouth at bedtime. TAKE 1  TABLET(40 MG) BY MOUTH AT BEDTIME 03/06/24   Glendia Shad, MD  Coenzyme Q10 (COQ10) 200 MG CAPS Take 1 capsule by mouth daily.    [provider]  cyclobenzaprine  (FLEXERIL ) 5 MG tablet Take 1 tablet (5 mg total) by mouth 3 (three) times daily as needed for muscle spasms. 04/07/24   Dorothyann Drivers, MD  Doxylamine  Succinate, Sleep, (SLEEP AID PO) Take 1 tablet by mouth at bedtime as needed (sleep).    [provider]  FLUoxetine  (PROZAC ) 40 MG capsule TAKE 1 CAPSULE(40 MG) BY MOUTH DAILY 03/30/24   Glendia Shad, MD  hydrochlorothiazide  (HYDRODIURIL ) 25 MG tablet TAKE 1 TABLET(25 MG) BY MOUTH EVERY EVENING 03/06/24   Glendia Shad, MD  ketoconazole (NIZORAL) 2 % cream Apply topically. 02/02/21   [provider]  losartan  (COZAAR ) 100 MG tablet TAKE 1 TABLET(100 MG) BY MOUTH DAILY 02/04/24   Glendia Shad, MD  Multiple Vitamins-Minerals (CENTRUM SILVER PO) Take 1 tablet by mouth daily.  [provider]  nystatin  cream (MYCOSTATIN ) Apply 1 application topically 2 (two) times daily. 04/27/20   Glendia Shad, MD  Polyethylene Glycol 3350 (MIRALAX PO) Take 17 g by mouth daily as needed (constipation).    [provider]  senna (SENOKOT) 8.6 MG tablet Take 1 tablet by mouth daily.    [provider]    Family History Family History  Problem Relation Age of Onset   Congestive Heart Failure Mother    Diabetes Father    Congestive Heart Failure Father    Congestive Heart Failure Sister    Congestive Heart Failure Brother     Social History Social History   Tobacco Use   Smoking status: Former    Current packs/day: 0.00    Types: Cigarettes    Quit date: 10/08/1960    Years since quitting: 63.6   Smokeless tobacco: Never  Vaping Use   Vaping status: Never Used  Substance Use Topics   Alcohol use: Yes    Comment: 2 cocktails   Drug use: Never     Allergies   Influenza virus vaccine, Penicillins, Egg white [egg white (egg  protein)], Lisinopril, and Haemophilus b polysaccharide vaccine   Review of Systems Review of Systems  Constitutional:  Positive for fever.  HENT:  Positive for congestion, rhinorrhea and sore throat. Negative for sneezing.   Respiratory:  Positive for cough, shortness of breath and wheezing.   Gastrointestinal:  Negative for diarrhea, nausea and vomiting.  Neurological:  Negative for headaches.  All other systems reviewed and are negative.    Physical Exam Triage Vital Signs ED Triage Vitals [05/01/24 1622]  Encounter Vitals Group     BP (!) 148/79     Girls Systolic BP Percentile      Girls Diastolic BP Percentile      Boys Systolic BP Percentile      Boys Diastolic BP Percentile      Pulse Rate 61     Resp 18     Temp 98.7 F (37.1 C)     Temp src      SpO2 97 %     Weight      Height      Head Circumference      Peak Flow      Pain Score 0     Pain Loc      Pain Education      Exclude from Growth Chart    No data found.  Updated Vital Signs BP (!) 148/79   Pulse 61   Temp 98.7 F (37.1 C)   Resp 18   SpO2 97%   Visual Acuity Right Eye Distance:   Left Eye Distance:   Bilateral Distance:    Right Eye Near:   Left Eye Near:    Bilateral Near:     Physical Exam Vitals reviewed.  Constitutional:      General: She is awake. She is not in acute distress.    Appearance: Normal appearance. She is well-developed. She is not ill-appearing, toxic-appearing or diaphoretic.  HENT:     Head: Normocephalic.     Right Ear: Tympanic membrane, ear canal and external ear normal. No drainage, swelling or tenderness. No middle ear effusion. Tympanic membrane is not erythematous.     Left Ear: Tympanic membrane, ear canal and external ear normal. No drainage, swelling or tenderness.  No middle ear effusion. Tympanic membrane is not erythematous.     Nose: Congestion and rhinorrhea present.  Mouth/Throat:     Lips: Pink.     Mouth: Mucous membranes are moist.      Pharynx: No pharyngeal swelling, oropharyngeal exudate, posterior oropharyngeal erythema or uvula swelling.     Tonsils: No tonsillar exudate or tonsillar abscesses.  Eyes:     General: Vision grossly intact.     Conjunctiva/sclera: Conjunctivae normal.  Cardiovascular:     Rate and Rhythm: Normal rate.     Heart sounds: Normal heart sounds.  Pulmonary:     Effort: Pulmonary effort is normal. No tachypnea or respiratory distress.     Breath sounds: Normal breath sounds and air entry. No decreased air movement. No decreased breath sounds, wheezing or rhonchi.  Musculoskeletal:        General: Normal range of motion.     Cervical back: Normal range of motion and neck supple.  Lymphadenopathy:     Cervical: No cervical adenopathy.  Skin:    General: Skin is warm and dry.  Neurological:     General: No focal deficit present.     Mental Status: She is alert and oriented to person, place, and time.  Psychiatric:        Behavior: Behavior is cooperative.      UC Treatments / Results  Labs (all labs ordered are listed, but only abnormal results are displayed) Labs Reviewed - No data to display  EKG   Radiology No results found.  Procedures Procedures (including critical care time)  Medications Ordered in UC Medications - No data to display  Initial Impression / Assessment and Plan / UC Course  I have reviewed the triage vital signs and the nursing notes.  Pertinent labs & imaging results that were available during my care of the patient were reviewed by me and considered in my medical decision making (see chart for details).     Patient presents with a 10-day history of upper respiratory symptoms including persistent cough with yellow sputum, wheezing, and chest rattling. There is no history of chronic lung disease or smoking. The presentation is consistent with acute bronchitis, likely bacterial given the prolonged course and ongoing productive cough. Treatment includes  azithromycin  and prednisone to address suspected bacterial infection and reduce airway inflammation. An albuterol inhaler was prescribed for use as needed for wheezing, with instructions to rinse the mouth after use to prevent oral yeast infection. Supportive care includes discontinued use of over-the-counter cough medications, prescriptions for promethazine DM as needed and Mucinex DM twice daily to aid mucus clearance. Patient was advised to increase fluid intake, use a humidifier at night, and change their toothbrush after completing the antibiotic course. Educated that cough may linger for several weeks. Follow up with PCP if symptoms worsen, fever returns, or if shortness of breath or persistent wheezing develops. ED evaluation recommended for any signs of respiratory distress.  Today's evaluation has revealed no signs of a dangerous process. Discussed diagnosis with patient and/or guardian. Patient and/or guardian aware of their diagnosis, possible red flag symptoms to watch out for and need for close follow up. Patient and/or guardian understands verbal and written discharge instructions. Patient and/or guardian comfortable with plan and disposition.  Patient and/or guardian has a clear mental status at this time, good insight into illness (after discussion and teaching) and has clear judgment to make decisions regarding their care  Documentation was completed with the aid of voice recognition software. Transcription may contain typographical errors. Final Clinical Impressions(s) / UC Diagnoses   Final diagnoses:  Acute bronchitis, unspecified  organism     Discharge Instructions      You have been diagnosed with acute bronchitis, which is causing your persistent cough, chest congestion, wheezing, and yellow sputum. You have been prescribed azithromycin  to treat a possible bacterial infection and prednisone to reduce inflammation in your airways. You should discontinue all over-the-counter cough  medicine. You have been prescribed promethazine DM for cough (as needed) and Mucinex DM twice daily to help loosen mucus. An albuterol inhaler has been provided to relieve wheezing; use it as needed and remember to rinse your mouth with water after each use to prevent oral irritation and/or oral yeast infection.  Drink plenty of fluids to help thin mucus, and use a humidifier at night to keep your airways moist. Change your toothbrush after finishing your antibiotic course to prevent reinfection. It's normal for your cough to linger for several weeks, even as other symptoms improve.  Follow up with your primary care provider if your symptoms do not improve, if you develop a new or recurrent fever, or if you experience worsening wheezing or shortness of breath. Go to the emergency room if you have difficulty breathing, chest pain, or symptoms that suddenly become severe.      ED Prescriptions     Medication Sig Dispense Auth. Provider   azithromycin  (ZITHROMAX ) 500 MG tablet Take 1 tablet (500 mg total) by mouth daily for 5 days. 5 tablet Amna Welker, [place], FNP   Dextromethorphan-guaiFENesin (MUCINEX DM MAXIMUM STRENGTH) 60-1200 MG TB12 Take 1 tablet by mouth 2 (two) times daily. 20 tablet Iola Lukes, FNP   promethazine-dextromethorphan (PROMETHAZINE-DM) 6.25-15 MG/5ML syrup Take 5 mLs by mouth every 6 (six) hours as needed for cough. 118 mL Iola Lukes, FNP   predniSONE (DELTASONE) 20 MG tablet Take 2 tablets (40 mg total) by mouth daily for 5 days. 10 tablet Iola Lukes, FNP   albuterol (VENTOLIN HFA) 108 (90 Base) MCG/ACT inhaler Inhale 1-2 puffs into the lungs every 6 (six) hours as needed for wheezing or shortness of breath. 17 g Iola Lukes, FNP      PDMP not reviewed this encounter.   Iola Lukes, [place] 05/01/24 [phone]

## 2024-05-01 NOTE — ED Triage Notes (Signed)
 Patient to Urgent Care with complaints of nasal congestion/ productive cough with yellow phlegm/ wheezing. Fever at beginning of illness.   Symptoms almost x2 weeks.   Taking allergy meds/ otc cold meds.

## 2024-05-01 NOTE — Discharge Instructions (Addendum)
 You have been diagnosed with acute bronchitis, which is causing your persistent cough, chest congestion, wheezing, and yellow sputum. You have been prescribed azithromycin  to treat a possible bacterial infection and prednisone to reduce inflammation in your airways. You should discontinue all over-the-counter cough medicine. You have been prescribed promethazine DM for cough (as needed) and Mucinex DM twice daily to help loosen mucus. An albuterol inhaler has been provided to relieve wheezing; use it as needed and remember to rinse your mouth with water after each use to prevent oral irritation and/or oral yeast infection.  Drink plenty of fluids to help thin mucus, and use a humidifier at night to keep your airways moist. Change your toothbrush after finishing your antibiotic course to prevent reinfection. It's normal for your cough to linger for several weeks, even as other symptoms improve.  Follow up with your primary care provider if your symptoms do not improve, if you develop a new or recurrent fever, or if you experience worsening wheezing or shortness of breath. Go to the emergency room if you have difficulty breathing, chest pain, or symptoms that suddenly become severe.

## 2024-05-08 HISTORY — PX: HAND EXPLORATION: SHX1725

## 2024-05-15 DIAGNOSIS — Z7982 Long term (current) use of aspirin: Secondary | ICD-10-CM | POA: Diagnosis not present

## 2024-05-15 DIAGNOSIS — M720 Palmar fascial fibromatosis [Dupuytren]: Secondary | ICD-10-CM | POA: Diagnosis not present

## 2024-05-15 DIAGNOSIS — M65321 Trigger finger, right index finger: Secondary | ICD-10-CM | POA: Diagnosis not present

## 2024-05-15 DIAGNOSIS — Z950 Presence of cardiac pacemaker: Secondary | ICD-10-CM | POA: Diagnosis not present

## 2024-05-15 DIAGNOSIS — M65341 Trigger finger, right ring finger: Secondary | ICD-10-CM | POA: Diagnosis not present

## 2024-05-15 DIAGNOSIS — M65331 Trigger finger, right middle finger: Secondary | ICD-10-CM | POA: Diagnosis not present

## 2024-05-15 DIAGNOSIS — Z96659 Presence of unspecified artificial knee joint: Secondary | ICD-10-CM | POA: Diagnosis not present

## 2024-05-15 DIAGNOSIS — G8918 Other acute postprocedural pain: Secondary | ICD-10-CM | POA: Diagnosis not present

## 2024-05-15 DIAGNOSIS — M65841 Other synovitis and tenosynovitis, right hand: Secondary | ICD-10-CM | POA: Diagnosis not present

## 2024-05-26 DIAGNOSIS — M720 Palmar fascial fibromatosis [Dupuytren]: Secondary | ICD-10-CM | POA: Diagnosis not present

## 2024-05-26 DIAGNOSIS — M65341 Trigger finger, right ring finger: Secondary | ICD-10-CM | POA: Diagnosis not present

## 2024-05-26 DIAGNOSIS — M65331 Trigger finger, right middle finger: Secondary | ICD-10-CM | POA: Diagnosis not present

## 2024-06-02 DIAGNOSIS — M720 Palmar fascial fibromatosis [Dupuytren]: Secondary | ICD-10-CM | POA: Diagnosis not present

## 2024-06-02 DIAGNOSIS — M65331 Trigger finger, right middle finger: Secondary | ICD-10-CM | POA: Diagnosis not present

## 2024-06-02 DIAGNOSIS — M65341 Trigger finger, right ring finger: Secondary | ICD-10-CM | POA: Diagnosis not present

## 2024-06-04 ENCOUNTER — Encounter: Payer: Self-pay | Admitting: Pharmacist

## 2024-06-04 NOTE — Progress Notes (Signed)
 Pharmacy Quality Measure Review  This patient is appearing on a report for being at risk of failing the adherence measure for cholesterol (statin) medications this calendar year.   Medication: atorvastatin  40 mg daily Last fill date: 02/01/2024 for 90 day supply  New refill has been sent to the pharmacy as of May though was never refilled when due in July.  Called [facility] pharmacy to facilitate refill. Will be ready for pickup by tomorrow 4pm

## 2024-06-18 DIAGNOSIS — M65341 Trigger finger, right ring finger: Secondary | ICD-10-CM | POA: Diagnosis not present

## 2024-06-18 DIAGNOSIS — M65331 Trigger finger, right middle finger: Secondary | ICD-10-CM | POA: Diagnosis not present

## 2024-06-18 DIAGNOSIS — M720 Palmar fascial fibromatosis [Dupuytren]: Secondary | ICD-10-CM | POA: Diagnosis not present

## 2024-06-23 DIAGNOSIS — M65341 Trigger finger, right ring finger: Secondary | ICD-10-CM | POA: Diagnosis not present

## 2024-06-23 DIAGNOSIS — M65331 Trigger finger, right middle finger: Secondary | ICD-10-CM | POA: Diagnosis not present

## 2024-06-23 DIAGNOSIS — M720 Palmar fascial fibromatosis [Dupuytren]: Secondary | ICD-10-CM | POA: Diagnosis not present

## 2024-06-30 ENCOUNTER — Telehealth: Payer: Self-pay

## 2024-06-30 NOTE — Telephone Encounter (Signed)
 Called patient to follow up due to scheduling error. Pt was placed on PCP schedule for pace maker issue, possible afib episodes, wellness check. Patient stated that on 9/11-9/12 she felt short of breath, having palpitations, body vibrating, ankles swelling, etc. Pt reported recent surgery with no adjustments made to her pacemaker like they had done in the past with previous surgeries. Confirmed with patient that she is having no acute issues at this time except for her ankles are still swollen. She was wanting Dr Pete opinion to see if she should see cardiology. Advised patient that she does need to see her cardiologist. [facility]- pt has been scheduled with Dr Dewane tomorrow (9/24) at 3:45 pm. Patient is aware of appt date and time. Advised she could still come see Dr Glendia if she prefers but pt stated that she would just go to the appt with Dr Custovic and cancel appt with Dr Glendia. Pt has regular follow up scheduled next week with Dr Glendia.

## 2024-07-01 ENCOUNTER — Ambulatory Visit: Admitting: Internal Medicine

## 2024-07-01 DIAGNOSIS — I340 Nonrheumatic mitral (valve) insufficiency: Secondary | ICD-10-CM | POA: Diagnosis not present

## 2024-07-01 DIAGNOSIS — I442 Atrioventricular block, complete: Secondary | ICD-10-CM | POA: Diagnosis not present

## 2024-07-01 DIAGNOSIS — R0602 Shortness of breath: Secondary | ICD-10-CM | POA: Diagnosis not present

## 2024-07-01 DIAGNOSIS — I10 Essential (primary) hypertension: Secondary | ICD-10-CM | POA: Diagnosis not present

## 2024-07-01 DIAGNOSIS — Z950 Presence of cardiac pacemaker: Secondary | ICD-10-CM | POA: Diagnosis not present

## 2024-07-01 DIAGNOSIS — I447 Left bundle-branch block, unspecified: Secondary | ICD-10-CM | POA: Diagnosis not present

## 2024-07-02 DIAGNOSIS — M720 Palmar fascial fibromatosis [Dupuytren]: Secondary | ICD-10-CM | POA: Diagnosis not present

## 2024-07-02 DIAGNOSIS — M65331 Trigger finger, right middle finger: Secondary | ICD-10-CM | POA: Diagnosis not present

## 2024-07-02 DIAGNOSIS — M65341 Trigger finger, right ring finger: Secondary | ICD-10-CM | POA: Diagnosis not present

## 2024-07-06 DIAGNOSIS — Z950 Presence of cardiac pacemaker: Secondary | ICD-10-CM | POA: Diagnosis not present

## 2024-07-06 DIAGNOSIS — I10 Essential (primary) hypertension: Secondary | ICD-10-CM | POA: Diagnosis not present

## 2024-07-06 DIAGNOSIS — I447 Left bundle-branch block, unspecified: Secondary | ICD-10-CM | POA: Diagnosis not present

## 2024-07-06 DIAGNOSIS — I442 Atrioventricular block, complete: Secondary | ICD-10-CM | POA: Diagnosis not present

## 2024-07-06 DIAGNOSIS — G4733 Obstructive sleep apnea (adult) (pediatric): Secondary | ICD-10-CM | POA: Diagnosis not present

## 2024-07-06 DIAGNOSIS — Z87891 Personal history of nicotine dependence: Secondary | ICD-10-CM | POA: Diagnosis not present

## 2024-07-06 DIAGNOSIS — I340 Nonrheumatic mitral (valve) insufficiency: Secondary | ICD-10-CM | POA: Diagnosis not present

## 2024-07-06 DIAGNOSIS — I5032 Chronic diastolic (congestive) heart failure: Secondary | ICD-10-CM | POA: Diagnosis not present

## 2024-07-06 DIAGNOSIS — N1832 Chronic kidney disease, stage 3b: Secondary | ICD-10-CM | POA: Diagnosis not present

## 2024-07-06 DIAGNOSIS — E785 Hyperlipidemia, unspecified: Secondary | ICD-10-CM | POA: Diagnosis not present

## 2024-07-06 DIAGNOSIS — I517 Cardiomegaly: Secondary | ICD-10-CM | POA: Diagnosis not present

## 2024-07-06 DIAGNOSIS — I6523 Occlusion and stenosis of bilateral carotid arteries: Secondary | ICD-10-CM | POA: Diagnosis not present

## 2024-07-06 DIAGNOSIS — Z7982 Long term (current) use of aspirin: Secondary | ICD-10-CM | POA: Diagnosis not present

## 2024-07-09 ENCOUNTER — Other Ambulatory Visit (INDEPENDENT_AMBULATORY_CARE_PROVIDER_SITE_OTHER)

## 2024-07-09 DIAGNOSIS — R739 Hyperglycemia, unspecified: Secondary | ICD-10-CM

## 2024-07-09 DIAGNOSIS — E785 Hyperlipidemia, unspecified: Secondary | ICD-10-CM

## 2024-07-09 DIAGNOSIS — I10 Essential (primary) hypertension: Secondary | ICD-10-CM

## 2024-07-09 LAB — LIPID PANEL
Cholesterol: 151 mg/dL (ref 0–200)
HDL: 59.8 mg/dL (ref >39.00)
LDL Cholesterol: 72 mg/dL (ref 0–99)
NonHDL: 90.73
Total CHOL/HDL Ratio: 3
Triglycerides: 95 mg/dL (ref 0.0–149.0)
VLDL: 19 mg/dL (ref 0.0–40.0)

## 2024-07-09 LAB — BASIC METABOLIC PANEL WITH GFR
BUN: 28 mg/dL — ABNORMAL HIGH (ref 6–23)
CO2: 28 meq/L (ref 19–32)
Calcium: 9.6 mg/dL (ref 8.4–10.5)
Chloride: 104 meq/L (ref 96–112)
Creatinine, Ser: 1.33 mg/dL — ABNORMAL HIGH (ref 0.40–1.20)
GFR: 37.02 mL/min — ABNORMAL LOW (ref >60.00)
Glucose, Bld: 124 mg/dL — ABNORMAL HIGH (ref 70–99)
Potassium: 4.3 meq/L (ref 3.5–5.1)
Sodium: 141 meq/L (ref 135–145)

## 2024-07-09 LAB — HEPATIC FUNCTION PANEL
ALT: 16 U/L (ref 0–35)
AST: 19 U/L (ref 0–37)
Albumin: 4.4 g/dL (ref 3.5–5.2)
Alkaline Phosphatase: 67 U/L (ref 39–117)
Bilirubin, Direct: 0.2 mg/dL (ref 0.0–0.3)
Total Bilirubin: 0.7 mg/dL (ref 0.2–1.2)
Total Protein: 7.1 g/dL (ref 6.0–8.3)

## 2024-07-09 LAB — HEMOGLOBIN A1C: Hgb A1c MFr Bld: 6 % (ref 4.6–6.5)

## 2024-07-10 ENCOUNTER — Ambulatory Visit: Payer: Self-pay | Admitting: Internal Medicine

## 2024-07-13 ENCOUNTER — Ambulatory Visit: Admitting: Internal Medicine

## 2024-07-13 VITALS — BP 130/72 | HR 68 | Resp 16 | Ht 67.5 in | Wt 243.4 lb

## 2024-07-13 DIAGNOSIS — Z23 Encounter for immunization: Secondary | ICD-10-CM

## 2024-07-13 DIAGNOSIS — Z0000 Encounter for general adult medical examination without abnormal findings: Secondary | ICD-10-CM | POA: Diagnosis not present

## 2024-07-13 DIAGNOSIS — R739 Hyperglycemia, unspecified: Secondary | ICD-10-CM | POA: Diagnosis not present

## 2024-07-13 DIAGNOSIS — Z8679 Personal history of other diseases of the circulatory system: Secondary | ICD-10-CM | POA: Diagnosis not present

## 2024-07-13 DIAGNOSIS — E2839 Other primary ovarian failure: Secondary | ICD-10-CM

## 2024-07-13 DIAGNOSIS — Z95811 Presence of heart assist device: Secondary | ICD-10-CM

## 2024-07-13 DIAGNOSIS — E785 Hyperlipidemia, unspecified: Secondary | ICD-10-CM

## 2024-07-13 DIAGNOSIS — G4730 Sleep apnea, unspecified: Secondary | ICD-10-CM | POA: Diagnosis not present

## 2024-07-13 DIAGNOSIS — M7989 Other specified soft tissue disorders: Secondary | ICD-10-CM

## 2024-07-13 DIAGNOSIS — N1832 Chronic kidney disease, stage 3b: Secondary | ICD-10-CM

## 2024-07-13 DIAGNOSIS — I10 Essential (primary) hypertension: Secondary | ICD-10-CM

## 2024-07-13 DIAGNOSIS — I779 Disorder of arteries and arterioles, unspecified: Secondary | ICD-10-CM

## 2024-07-13 NOTE — Progress Notes (Signed)
 Subjective:    Patient ID: Amber Bridges, female    DOB: 05-29-1941, 83 y.o.   MRN: [id number]  Patient here for  Chief Complaint  Patient presents with   Annual Exam    HPI Here for a physical exam. 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 Had been experiencing swelling in her ankles. Takes hydrochlorothiazide . Had been started on lasix 20mg  for 5 days. Recommended echo and to continue lasix. ECHO scheduled for 07/15/24. She has electric bed. Elevating legs. Wearing compression hose. She is s/p right hand fasciectomy. Had f/u with Dr Francisco 06/02/24 to have stitches removed. Recommended wearing brace at night and to continue OT. She is doing hand exercises at home. Overall doing well from her surgery. She declines mammogram. Is agreeable to bone density. Has sleep apnea. Wears cpap. Discussed given using for years, need for reevaluation. Discussed referral to Dr Jess. Had f/u with [facility] - f/u carotids - 09/2024.    Past Medical History:  Diagnosis Date   Arthritis    Bronchitis    Coronary artery disease    Depression    HOH (hard of hearing)    AIDS   Hypertension    Sleep apnea    CPAP   Past Surgical History:  Procedure Laterality Date   ABDOMINAL HYSTERECTOMY     APPENDECTOMY     BACK SURGERY     X 3   CATARACT EXTRACTION W/ INTRAOCULAR LENS IMPLANT Right 2017   CATARACT EXTRACTION W/PHACO Left 08/08/2016   Procedure: CATARACT EXTRACTION PHACO AND INTRAOCULAR LENS PLACEMENT (IOC);  Surgeon: Steven Dingeldein, MD;  Location: [facility];  Service: Ophthalmology;  Laterality: Left;  fluid lot # [id number]  exp02/28/2019 US     01:15.3 AP%   25.2 CDE   35.06    CTR Right    FOOT SURGERY     JOINT REPLACEMENT     TKR   KNEE ARTHROPLASTY Left 11/24/2018   Procedure: COMPUTER ASSISTED TOTAL KNEE ARTHROPLASTY-LEFT;  Surgeon: Mardee Lynwood SQUIBB, MD;  Location: [facility];  Service: Orthopedics;  Laterality: Left;   NOSE SURGERY     PACEMAKER INSERTION      PACEMAKER LEADLESS INSERTION  10/25/2022   PACEMAKER LEADLESS INSERTION N/A 10/25/2022   Procedure: PACEMAKER LEADLESS INSERTION;  Surgeon: Ammon Blunt, MD;  Location: [facility];  Service: Cardiovascular;  Laterality: N/A;   RCR Left    SHOULDER ARTHROSCOPY Right    for bursitis   SPINE SURGERY  1973, 1978, 2016   Most recent Laminectomy to T11 and L3   Family History  Problem Relation Age of Onset   Congestive Heart Failure Mother    Diabetes Father    Congestive Heart Failure Father    Congestive Heart Failure Sister    Congestive Heart Failure Brother    Social History   Socioeconomic History   Marital status: Widowed    Spouse name: Not on file   Number of children: Not on file   Years of education: Not on file   Highest education level: Not on file  Occupational History   Not on file  Tobacco Use   Smoking status: Former    Current packs/day: 0.00    Types: Cigarettes    Quit date: 10/08/1960    Years since quitting: 63.8   Smokeless tobacco: Never  Vaping Use   Vaping status: Never Used  Substance and Sexual Activity   Alcohol use: Yes    Comment: 2 cocktails  Drug use: Never   Sexual activity: Not on file  Other Topics Concern   Not on file  Social History Narrative   Not on file   Social Drivers of Health   Financial Resource Strain: Not on file  Food Insecurity: No Food Insecurity (10/25/2022)   Hunger Vital Sign    Worried About Running Out of Food in the Last Year: Never true    Ran Out of Food in the Last Year: Never true  Transportation Needs: No Transportation Needs (10/25/2022)   PRAPARE - Administrator, Civil Service (Medical): No    Lack of Transportation (Non-Medical): No  Physical Activity: Not on file  Stress: Not on file  Social Connections: Not on file     Review of Systems  Constitutional:  Negative for appetite change and unexpected weight change.  HENT:  Negative for congestion, sinus pressure and  sore throat.   Eyes:  Negative for pain and visual disturbance.  Respiratory:  Negative for cough and chest tightness.        Breathing stable.   Cardiovascular:  Positive for leg swelling. Negative for chest pain and palpitations.  Gastrointestinal:  Negative for abdominal pain, diarrhea, nausea and vomiting.  Genitourinary:  Negative for difficulty urinating and dysuria.  Musculoskeletal:  Negative for joint swelling and myalgias.  Skin:  Negative for color change and rash.  Neurological:  Negative for dizziness and headaches.  Hematological:  Negative for adenopathy. Does not bruise/bleed easily.  Psychiatric/Behavioral:  Negative for agitation and dysphoric mood.        Objective:     BP 130/72   Pulse 68   Resp 16   Ht 5' 7.5 (1.715 m)   Wt 243 lb 6.4 oz (110.4 kg)   SpO2 98%   BMI 37.56 kg/m  Wt Readings from Last 3 Encounters:  07/13/24 243 lb 6.4 oz (110.4 kg)  04/07/24 239 lb (108.4 kg)  03/06/24 244 lb 6.4 oz (110.9 kg)    Physical Exam Vitals reviewed.  Constitutional:      General: She is not in acute distress.    Appearance: Normal appearance. She is well-developed.  HENT:     Head: Normocephalic and atraumatic.     Right Ear: External ear normal.     Left Ear: External ear normal.     Mouth/Throat:     Pharynx: No oropharyngeal exudate or posterior oropharyngeal erythema.  Eyes:     General: No scleral icterus.       Right eye: No discharge.        Left eye: No discharge.     Conjunctiva/sclera: Conjunctivae normal.  Neck:     Thyroid : No thyromegaly.  Cardiovascular:     Rate and Rhythm: Normal rate and regular rhythm.  Pulmonary:     Effort: No tachypnea, accessory muscle usage or respiratory distress.     Breath sounds: Normal breath sounds. No decreased breath sounds or wheezing.  Chest:  Breasts:    Right: No inverted nipple, mass, nipple discharge or tenderness (no axillary adenopathy).     Left: No inverted nipple, mass, nipple discharge  or tenderness (no axilarry adenopathy).  Abdominal:     General: Bowel sounds are normal.     Palpations: Abdomen is soft.     Tenderness: There is no abdominal tenderness.  Musculoskeletal:        General: No tenderness.     Cervical back: Neck supple.     Comments: ankle swelling.  Lymphadenopathy:     Cervical: No cervical adenopathy.  Skin:    Findings: No erythema or rash.  Neurological:     Mental Status: She is alert and oriented to person, place, and time.  Psychiatric:        Mood and Affect: Mood normal.        Behavior: Behavior normal.         Outpatient Encounter Medications as of 07/13/2024  Medication Sig   acetaminophen  (TYLENOL ) 500 MG tablet Take 500 mg by mouth every 6 (six) hours as needed.   albuterol  (VENTOLIN  HFA) 108 (90 Base) MCG/ACT inhaler Inhale 1-2 puffs into the lungs every 6 (six) hours as needed for wheezing or shortness of breath.   amLODipine  (NORVASC ) 5 MG tablet TAKE 1 TABLET(5 MG) BY MOUTH TWICE DAILY   aspirin 81 MG EC tablet Take 81 mg by mouth daily.   atorvastatin  (LIPITOR) 40 MG tablet Take 1 tablet (40 mg total) by mouth at bedtime. TAKE 1 TABLET(40 MG) BY MOUTH AT BEDTIME   Coenzyme Q10 (COQ10) 200 MG CAPS Take 1 capsule by mouth daily.   cyclobenzaprine  (FLEXERIL ) 5 MG tablet Take 1 tablet (5 mg total) by mouth 3 (three) times daily as needed for muscle spasms.   Doxylamine  Succinate, Sleep, (SLEEP AID PO) Take 1 tablet by mouth at bedtime as needed (sleep).   FLUoxetine  (PROZAC ) 40 MG capsule TAKE 1 CAPSULE(40 MG) BY MOUTH DAILY   hydrochlorothiazide  (HYDRODIURIL ) 25 MG tablet TAKE 1 TABLET(25 MG) BY MOUTH EVERY EVENING   ketoconazole (NIZORAL) 2 % cream Apply topically.   losartan  (COZAAR ) 100 MG tablet TAKE 1 TABLET(100 MG) BY MOUTH DAILY   Multiple Vitamins-Minerals (CENTRUM SILVER PO) Take 1 tablet by mouth daily.   nystatin  cream (MYCOSTATIN ) Apply 1 application topically 2 (two) times daily.   Polyethylene Glycol 3350  (MIRALAX PO) Take 17 g by mouth daily as needed (constipation).   senna (SENOKOT) 8.6 MG tablet Take 1 tablet by mouth daily.   [DISCONTINUED] Dextromethorphan-guaiFENesin  (MUCINEX  DM MAXIMUM STRENGTH) 60-1200 MG TB12 Take 1 tablet by mouth 2 (two) times daily.   [DISCONTINUED] promethazine -dextromethorphan (PROMETHAZINE -DM) 6.25-15 MG/5ML syrup Take 5 mLs by mouth every 6 (six) hours as needed for cough.   No facility-administered encounter medications on file as of 07/13/2024.     Lab Results  Component Value Date   WBC 6.9 04/07/2024   HGB 12.7 04/07/2024   HCT 38.7 04/07/2024   PLT 204 04/07/2024   GLUCOSE 124 (H) 07/09/2024   CHOL 151 07/09/2024   TRIG 95.0 07/09/2024   HDL 59.80 07/09/2024   LDLCALC 72 07/09/2024   ALT 16 07/09/2024   AST 19 07/09/2024   NA 141 07/09/2024   K 4.3 07/09/2024   CL 104 07/09/2024   CREATININE 1.33 (H) 07/09/2024   BUN 28 (H) 07/09/2024   CO2 28 07/09/2024   TSH 2.75 06/24/2023   INR 0.96 11/12/2018   HGBA1C 6.0 07/09/2024       Assessment & Plan:  Healthcare maintenance Assessment & Plan: Physical today 07/13/24. Declines mammogram. Agreeable for bone density.    Immunization due -     Flu vaccine, recombinant, trivalent, inj  Hyperlipidemia, unspecified hyperlipidemia type Assessment & Plan: Continue lipitor.  Low cholesterol diet and exercise.  Follow lipid panel.  Lab Results  Component Value Date   CHOL 151 07/09/2024   HDL 59.80 07/09/2024   LDLCALC 72 07/09/2024   TRIG 95.0 07/09/2024   CHOLHDL 3 07/09/2024  Orders: -     Lipid panel; Future -     Hepatic function panel; Future  Essential hypertension Assessment & Plan: On losartan . Per report, is not taking hydrochlorothiazide . Taking lasix - as prescribed by cardiology. Follow pressures. Follow metabolic panel.   Orders: -     Basic metabolic panel with GFR; Future -     TSH; Future  Hyperglycemia Assessment & Plan: Low carb diet and exercise. Follow met  b and A1c.   Orders: -     Basic metabolic panel with GFR; Future -     Hemoglobin A1c; Future  Estrogen deficiency -     DG Bone Density; Future  Swelling of lower extremity Assessment & Plan: Continue leg elevation. Continue compression hose. Saw cardiology. They prescribed lasix. ECHO scheduled for 07/15/24. Continue f/u with cardiology.    Bilateral carotid artery disease, unspecified type Assessment & Plan: Carotid Duplex 09/2023 shows 40 to 59% stenosis in the right ICA with 1 to 39% stenosis in the left ICA. Stenosis percentage has increased there has not been a drastic increase in velocities since the previous study done on 09/10/2022 (09/2023 - f/u [facility] - Duplex ultrasound shows <50% stenosis bilaterally. Continue antiplatelet therapy. Follow up in 12 months with duplex ultrasound and physical exam ). F/u - 09/2024.    Stage 3b chronic kidney disease (HCC) Assessment & Plan: Avoid antiinflammatory medication. On losartan . Will need to follow with addition of lasix.    History of complete heart block Assessment & Plan: Is s/p micra pacer insertion 10/25/22. Since her pacemaker placement, she has felt better overall.    Presence of heart assist device [facility]) Assessment & Plan: S/p PPM as outlined.    Sleep apnea, unspecified type Assessment & Plan: Has known sleep apnea. Has been using cpap for years. Discussed establishing with Dr Jess for reevaluation and monitoring.   Orders: -     Pulmonary Visit     Allena Hamilton, MD

## 2024-07-13 NOTE — Assessment & Plan Note (Addendum)
 Physical today 07/13/24. Declines mammogram. Agreeable for bone density.

## 2024-07-15 DIAGNOSIS — I442 Atrioventricular block, complete: Secondary | ICD-10-CM | POA: Diagnosis not present

## 2024-07-19 ENCOUNTER — Encounter: Payer: Self-pay | Admitting: Internal Medicine

## 2024-07-19 NOTE — Assessment & Plan Note (Signed)
 Carotid Duplex 09/2023 shows 40 to 59% stenosis in the right ICA with 1 to 39% stenosis in the left ICA. Stenosis percentage has increased there has not been a drastic increase in velocities since the previous study done on 09/10/2022 (09/2023 - f/u [facility] - Duplex ultrasound shows <50% stenosis bilaterally. Continue antiplatelet therapy. Follow up in 12 months with duplex ultrasound and physical exam ). F/u - 09/2024.

## 2024-07-19 NOTE — Assessment & Plan Note (Signed)
 Has known sleep apnea. Has been using cpap for years. Discussed establishing with Dr Jess for reevaluation and monitoring.

## 2024-07-19 NOTE — Assessment & Plan Note (Signed)
 S/p PPM as outlined.

## 2024-07-19 NOTE — Assessment & Plan Note (Signed)
 Continue lipitor.  Low cholesterol diet and exercise.  Follow lipid panel.  Lab Results  Component Value Date   CHOL 151 07/09/2024   HDL 59.80 07/09/2024   LDLCALC 72 07/09/2024   TRIG 95.0 07/09/2024   CHOLHDL 3 07/09/2024

## 2024-07-19 NOTE — Assessment & Plan Note (Signed)
 Low-carb diet and exercise.  Follow met b and A1c.

## 2024-07-19 NOTE — Assessment & Plan Note (Signed)
 Is s/p micra pacer insertion 10/25/22. Since her pacemaker placement, she has felt better overall.

## 2024-07-19 NOTE — Assessment & Plan Note (Signed)
 Continue leg elevation. Continue compression hose. Saw cardiology. They prescribed lasix. ECHO scheduled for 07/15/24. Continue f/u with cardiology.

## 2024-07-19 NOTE — Assessment & Plan Note (Signed)
 Avoid antiinflammatory medication. On losartan . Will need to follow with addition of lasix.

## 2024-07-19 NOTE — Assessment & Plan Note (Signed)
 On losartan . Per report, is not taking hydrochlorothiazide . Taking lasix - as prescribed by cardiology. Follow pressures. Follow metabolic panel.

## 2024-07-20 ENCOUNTER — Other Ambulatory Visit: Payer: Self-pay | Admitting: Internal Medicine

## 2024-07-20 ENCOUNTER — Telehealth: Payer: Self-pay

## 2024-07-20 NOTE — Telephone Encounter (Signed)
 Called patient for clarification and was advised to disregard she has been scheduled with Dr Jess tomorrow.

## 2024-07-20 NOTE — Telephone Encounter (Signed)
 Copied from CRM [id number]. Topic: Referral - Question >> Jul 20, 2024 10:38 AM Berneda FALCON wrote: Reason for CRM: Patient wants Dr. Glendia to know that she did hear from the pulmonology but they told her that they would not schedule her an appt after hearing that she has had a transplant. States the communication was via facebook and did not get to actually talk to anyone. Wants PCP to call them and discuss with them what information she would like the patient to have done there to ensure this is something they would see her for or if we need a new referral.  Patient callback is [phone] (home)

## 2024-07-21 ENCOUNTER — Encounter: Payer: Self-pay | Admitting: Sleep Medicine

## 2024-07-21 ENCOUNTER — Ambulatory Visit (INDEPENDENT_AMBULATORY_CARE_PROVIDER_SITE_OTHER): Admitting: Sleep Medicine

## 2024-07-21 VITALS — BP 128/60 | HR 72 | Temp 97.6°F | Ht 67.5 in | Wt 246.6 lb

## 2024-07-21 DIAGNOSIS — Z87891 Personal history of nicotine dependence: Secondary | ICD-10-CM

## 2024-07-21 DIAGNOSIS — G4733 Obstructive sleep apnea (adult) (pediatric): Secondary | ICD-10-CM | POA: Diagnosis not present

## 2024-07-21 DIAGNOSIS — I10 Essential (primary) hypertension: Secondary | ICD-10-CM

## 2024-07-21 NOTE — Patient Instructions (Signed)

## 2024-07-21 NOTE — Progress Notes (Signed)
 Name:Amber Bridges MRN: [id number] DOB: [birth year]   CHIEF COMPLAINT:  ESTABLISH CARE FOR OSA   HISTORY OF PRESENT ILLNESS: Amber Bridges is a 83 y.o. w/ a h/o OSA, HTN, LBBB and obesity who presents to establish care for OSA. Reports that she was initially diagnosed with OSA several years ago and was subsequently started on CPAP therapy. Reports using CPAP therapy every night, which is confirmed by compliance data. She is currently using the Airfit N20 FFM, which causes discomfort. Reports needing a new CPAP mask.   Reports nocturnal awakenings due to nocturia, however does not have difficulty falling back to sleep. Denies any significant weight changes.    PAST MEDICAL HISTORY :   has a past medical history of Arthritis, Bronchitis, Coronary artery disease, Depression, HOH (hard of hearing), Hypertension, and Sleep apnea.  has a past surgical history that includes Spine surgery (1973, 1978, 2016); Back surgery; CTR (Right); Foot surgery; Abdominal hysterectomy; Joint replacement; RCR (Left); Nose surgery; Cataract extraction w/PHACO (Left, 08/08/2016); Cataract extraction w/ intraocular lens implant (Right, 2017); Shoulder arthroscopy (Right); Appendectomy; Knee Arthroplasty (Left, 11/24/2018); PACEMAKER LEADLESS INSERTION (10/25/2022); PACEMAKER LEADLESS INSERTION (N/A, 10/25/2022); and Pacemaker insertion. Prior to Admission medications   Medication Sig Start Date End Date Taking? Authorizing Provider  acetaminophen  (TYLENOL ) 500 MG tablet Take 500 mg by mouth every 6 (six) hours as needed.   Yes [provider]  albuterol  (VENTOLIN  HFA) 108 (90 Base) MCG/ACT inhaler Inhale 1-2 puffs into the lungs every 6 (six) hours as needed for wheezing or shortness of breath. 05/01/24  Yes Murrill, Lucie, FNP  amLODipine  (NORVASC ) 5 MG tablet TAKE 1 TABLET(5 MG) BY MOUTH TWICE DAILY 02/04/24  Yes Glendia Shad, MD  aspirin 81 MG EC tablet Take 81 mg by mouth daily.   Yes  [provider]  atorvastatin  (LIPITOR) 40 MG tablet Take 1 tablet (40 mg total) by mouth at bedtime. TAKE 1 TABLET(40 MG) BY MOUTH AT BEDTIME 03/06/24  Yes Glendia Shad, MD  Coenzyme Q10 (COQ10) 200 MG CAPS Take 1 capsule by mouth daily.   Yes [provider]  cyclobenzaprine  (FLEXERIL ) 5 MG tablet Take 1 tablet (5 mg total) by mouth 3 (three) times daily as needed for muscle spasms. 04/07/24  Yes Dorothyann Drivers, MD  Doxylamine  Succinate, Sleep, (SLEEP AID PO) Take 1 tablet by mouth at bedtime as needed (sleep).   Yes [provider]  FLUoxetine  (PROZAC ) 40 MG capsule TAKE 1 CAPSULE(40 MG) BY MOUTH DAILY 03/30/24  Yes Glendia Shad, MD  hydrochlorothiazide  (HYDRODIURIL ) 25 MG tablet TAKE 1 TABLET(25 MG) BY MOUTH EVERY EVENING 03/06/24  Yes Glendia Shad, MD  ketoconazole (NIZORAL) 2 % cream Apply topically. 02/02/21  Yes [provider]  losartan  (COZAAR ) 100 MG tablet TAKE 1 TABLET(100 MG) BY MOUTH DAILY 02/04/24  Yes Glendia Shad, MD  Multiple Vitamins-Minerals (CENTRUM SILVER PO) Take 1 tablet by mouth daily.   Yes [provider]  nystatin  cream (MYCOSTATIN ) Apply 1 application topically 2 (two) times daily. 04/27/20  Yes Glendia Shad, MD  Polyethylene Glycol 3350 (MIRALAX PO) Take 17 g by mouth daily as needed (constipation).   Yes [provider]  senna (SENOKOT) 8.6 MG tablet Take 1 tablet by mouth daily.   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 Can take flu vaccine that does not  relate to eggs Site of flu vaccine. Can take flu vaccine that does not relate to eggs    Penicillins Diarrhea    Has patient had a PCN reaction causing immediate rash, facial/tongue/throat swelling, SOB or lightheadedness with hypotension: No Has patient had a PCN reaction causing severe rash involving mucus  membranes or skin necrosis: No Has patient had a PCN reaction that required hospitalization No Has patient had a PCN reaction occurring within the last 10 years: No If all of the above answers are NO, then may proceed with Cephalosporin use.    Egg White [Egg Protein (Egg White)] Nausea Only   Lisinopril Cough   Haemophilus B Polysaccharide Vaccine Other (See Comments) and Swelling    Site of flu vaccine. Can take flu vaccine that does not relate to eggs    FAMILY HISTORY:  family history includes Congestive Heart Failure in her brother, father, mother, and sister; Diabetes in her father. SOCIAL HISTORY:  reports that she quit smoking about 63 years ago. Her smoking use included cigarettes. She has never used smokeless tobacco. She reports current alcohol use. She reports that she does not use drugs.   Review of Systems:  Gen:  Denies  fever, sweats, chills weight loss  HEENT: Denies blurred vision, double vision, ear pain, eye pain, hearing loss, nose bleeds, sore throat Cardiac:  No dizziness, chest pain or heaviness, chest tightness,edema, No JVD Resp:   No cough, -sputum production, -shortness of breath,-wheezing, -hemoptysis,  Gi: Denies swallowing difficulty, stomach pain, nausea or vomiting, diarrhea, constipation, bowel incontinence Gu:  Denies bladder incontinence, burning urine Ext:   Denies Joint pain, stiffness or swelling Skin: Denies  skin rash, easy bruising or bleeding or hives Endoc:  Denies polyuria, polydipsia , polyphagia or weight change Psych:   Denies depression, insomnia or hallucinations  Other:  All other systems negative  VITAL SIGNS: BP 128/60   Pulse 72   Temp 97.6 F (36.4 C)   Ht 5' 7.5 (1.715 m)   Wt 246 lb 9.6 oz (111.9 kg)   SpO2 98%   BMI 38.05 kg/m    Physical Examination:   General Appearance: No distress  EYES PERRLA, EOM intact.   NECK Supple, No JVD Pulmonary: normal breath sounds, No wheezing.  CardiovascularNormal S1,S2.   No m/r/g.   Abdomen: Benign, Soft, non-tender. Skin:   warm, no rashes, no ecchymosis  Extremities: normal, no cyanosis, clubbing. Neuro:without focal findings,  speech normal  PSYCHIATRIC: Mood, affect within normal limits.   ASSESSMENT AND PLAN  OSA Patient is using and benefiting from CPAP therapy. Will try patient on the Airfit N30i nasal mask due to discomfort with her current. Discussed the consequences of untreated sleep apnea. Advised not to drive drowsy for safety of patient and others. Will follow up in 1 year.    HTN Stable, on current management. Following with PCP.    Patient  satisfied with Plan of action and management. All questions answered  I spent a total of 30 minutes reviewing chart data, face-to-face evaluation with the patient, counseling and coordination of care as detailed above.    Tinlee Navarrette, M.D.  Sleep Medicine [facility]

## 2024-07-23 DIAGNOSIS — M720 Palmar fascial fibromatosis [Dupuytren]: Secondary | ICD-10-CM | POA: Diagnosis not present

## 2024-07-23 DIAGNOSIS — M65341 Trigger finger, right ring finger: Secondary | ICD-10-CM | POA: Diagnosis not present

## 2024-07-23 DIAGNOSIS — M65331 Trigger finger, right middle finger: Secondary | ICD-10-CM | POA: Diagnosis not present

## 2024-07-30 ENCOUNTER — Other Ambulatory Visit

## 2024-07-30 DIAGNOSIS — I442 Atrioventricular block, complete: Secondary | ICD-10-CM | POA: Diagnosis not present

## 2024-07-30 DIAGNOSIS — Z7982 Long term (current) use of aspirin: Secondary | ICD-10-CM | POA: Diagnosis not present

## 2024-07-30 DIAGNOSIS — G4733 Obstructive sleep apnea (adult) (pediatric): Secondary | ICD-10-CM | POA: Diagnosis not present

## 2024-07-30 DIAGNOSIS — I5032 Chronic diastolic (congestive) heart failure: Secondary | ICD-10-CM | POA: Diagnosis not present

## 2024-07-30 DIAGNOSIS — N1832 Chronic kidney disease, stage 3b: Secondary | ICD-10-CM | POA: Diagnosis not present

## 2024-07-30 DIAGNOSIS — Z87891 Personal history of nicotine dependence: Secondary | ICD-10-CM | POA: Diagnosis not present

## 2024-07-30 DIAGNOSIS — I10 Essential (primary) hypertension: Secondary | ICD-10-CM | POA: Diagnosis not present

## 2024-07-30 DIAGNOSIS — I6523 Occlusion and stenosis of bilateral carotid arteries: Secondary | ICD-10-CM | POA: Diagnosis not present

## 2024-07-30 DIAGNOSIS — I517 Cardiomegaly: Secondary | ICD-10-CM | POA: Diagnosis not present

## 2024-07-30 DIAGNOSIS — E785 Hyperlipidemia, unspecified: Secondary | ICD-10-CM | POA: Diagnosis not present

## 2024-07-30 DIAGNOSIS — I340 Nonrheumatic mitral (valve) insufficiency: Secondary | ICD-10-CM | POA: Diagnosis not present

## 2024-08-03 ENCOUNTER — Ambulatory Visit
Admission: RE | Admit: 2024-08-03 | Discharge: 2024-08-03 | Disposition: A | Discharge status: Home / Self Care | Source: Ambulatory Visit | Attending: Internal Medicine | Admitting: Internal Medicine

## 2024-08-03 DIAGNOSIS — E2839 Other primary ovarian failure: Secondary | ICD-10-CM | POA: Insufficient documentation

## 2024-08-03 DIAGNOSIS — Z780 Asymptomatic menopausal state: Secondary | ICD-10-CM | POA: Diagnosis not present

## 2024-08-04 ENCOUNTER — Ambulatory Visit: Payer: Self-pay | Admitting: Internal Medicine

## 2024-09-02 ENCOUNTER — Other Ambulatory Visit (INDEPENDENT_AMBULATORY_CARE_PROVIDER_SITE_OTHER): Payer: Self-pay | Admitting: Nurse Practitioner

## 2024-09-02 DIAGNOSIS — I6523 Occlusion and stenosis of bilateral carotid arteries: Secondary | ICD-10-CM

## 2024-09-04 ENCOUNTER — Encounter: Payer: Self-pay | Admitting: Pharmacist

## 2024-09-04 NOTE — Progress Notes (Signed)
 Pharmacy Quality Measure Review  This patient is appearing on a report for being at risk of failing the adherence measure for hypertension (ACEi/ARB) medications this calendar year.   Medication: losartan  100 mg Last fill date: 05/09/24 for 90 day supply  Insurance report was not up to date. No action needed at this time.  Medication has been refilled as of 08/07/24 x90ds.  Next refill due 2026.

## 2024-09-08 ENCOUNTER — Ambulatory Visit (INDEPENDENT_AMBULATORY_CARE_PROVIDER_SITE_OTHER): Payer: Medicare Other

## 2024-09-08 ENCOUNTER — Encounter (INDEPENDENT_AMBULATORY_CARE_PROVIDER_SITE_OTHER): Payer: Self-pay | Admitting: Nurse Practitioner

## 2024-09-08 ENCOUNTER — Ambulatory Visit (INDEPENDENT_AMBULATORY_CARE_PROVIDER_SITE_OTHER): Payer: Medicare Other | Admitting: Nurse Practitioner

## 2024-09-08 VITALS — BP 143/74 | HR 56 | Resp 17 | Ht 67.5 in | Wt 241.6 lb

## 2024-09-08 DIAGNOSIS — I779 Disorder of arteries and arterioles, unspecified: Secondary | ICD-10-CM | POA: Diagnosis not present

## 2024-09-08 DIAGNOSIS — E785 Hyperlipidemia, unspecified: Secondary | ICD-10-CM

## 2024-09-08 DIAGNOSIS — I6523 Occlusion and stenosis of bilateral carotid arteries: Secondary | ICD-10-CM

## 2024-09-08 DIAGNOSIS — I10 Essential (primary) hypertension: Secondary | ICD-10-CM | POA: Diagnosis not present

## 2024-09-08 DIAGNOSIS — I771 Stricture of artery: Secondary | ICD-10-CM

## 2024-09-08 NOTE — Progress Notes (Signed)
 Subjective:    Patient ID: Amber Bridges, female    DOB: 05-08-1941, 83 y.o.   MRN: [id number] Chief Complaint  Patient presents with   Follow-up    1 year carotid    HPI  Discussed the use of AI scribe software for clinical note transcription with the patient, who gave verbal consent to proceed.  History of Present Illness Amber Bridges is an 83 year old female with Carotid stenosis .  In late September to early October, she experienced a sensation of intense heart vibration. An echocardiogram showed no abnormalities. Her CPAP machine recorded episodes of apnea at night, previously associated with her heart stopping and restarting out of sync, causing convulsions that woke her husband but not her.  Recent studies showed turbulent flow in the right subclavian artery, and vertebral artery flow was described as anagrade. Recent imaging showed carotid artery velocities on the right side were slightly worse compared to last year, while the left side remained unchanged.  She walks about three times a week, primarily in stores to avoid falling, due to a history of three back surgeries, two artificial knees, and drop foot following her last back surgery in 2016. She uses a stick for support when walking.  She reports occasional visual disturbances described as looking through a prism , which she was informed could be related to migraines, although she no longer experiences migraines. She has not had sudden blindness or persistent visual changes.  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    Results RADIOLOGY Subclavian Artery Ultrasound: Right side turbulent flow, vertebral artery anterograde flow, improved velocities compared to last study.  Right carotid artery 40 to 59%, left 1 to 39%.  Unchanged from previous studies.  DIAGNOSTIC Echocardiogram: Normal (06/2024)   Review of Systems  Musculoskeletal:  Positive for gait problem.   All other systems reviewed and are negative.      Objective:   Physical Exam Vitals reviewed.  HENT:     Head: Normocephalic.  Cardiovascular:     Rate and Rhythm: Normal rate.     Pulses: Normal pulses.  Pulmonary:     Effort: Pulmonary effort is normal.  Skin:    General: Skin is warm and dry.  Neurological:     Mental Status: She is alert and oriented to person, place, and time.     Gait: Gait abnormal.  Psychiatric:        Mood and Affect: Mood normal.        Behavior: Behavior normal.        Thought Content: Thought content normal.        Judgment: Judgment normal.     Physical Exam    BP (!) 143/74 (BP Location: Left Arm, Patient Position: Sitting, Cuff Size: Large)   Pulse (!) 56   Resp 17   Ht 5' 7.5 (1.715 m)   Wt 241 lb 9.6 oz (109.6 kg)   BMI 37.28 kg/m   Past Medical History:  Diagnosis Date   Arthritis    Bronchitis    Coronary artery disease    Depression    HOH (hard of hearing)    AIDS   Hypertension    Sleep apnea    CPAP    Social History   Socioeconomic History   Marital status: Widowed    Spouse name: Not on file   Number of children: Not on file   Years of education: Not on file   Highest education level:  Not on file  Occupational History   Not on file  Tobacco Use   Smoking status: Former    Current packs/day: 0.00    Types: Cigarettes    Quit date: 10/08/1960    Years since quitting: 63.9   Smokeless tobacco: Never  Vaping Use   Vaping status: Never Used  Substance and Sexual Activity   Alcohol use: Yes    Comment: 2 cocktails   Drug use: Never   Sexual activity: Not on file  Other Topics Concern   Not on file  Social History Narrative   Not on file   Social Drivers of Health   Financial Resource Strain: Not on file  Food Insecurity: No Food Insecurity (10/25/2022)   Hunger Vital Sign    Worried About Running Out of Food in the Last Year: Never true    Ran Out of Food in the Last Year: Never true   Transportation Needs: No Transportation Needs (10/25/2022)   PRAPARE - Administrator, Civil Service (Medical): No    Lack of Transportation (Non-Medical): No  Physical Activity: Not on file  Stress: Not on file  Social Connections: Not on file  Intimate Partner Violence: Not At Risk (10/25/2022)   Humiliation, Afraid, Rape, and Kick questionnaire    Fear of Current or Ex-Partner: No    Emotionally Abused: No    Physically Abused: No    Sexually Abused: No    Past Surgical History:  Procedure Laterality Date   ABDOMINAL HYSTERECTOMY     APPENDECTOMY     BACK SURGERY     X 3   CATARACT EXTRACTION W/ INTRAOCULAR LENS IMPLANT Right 2017   CATARACT EXTRACTION W/PHACO Left 08/08/2016   Procedure: CATARACT EXTRACTION PHACO AND INTRAOCULAR LENS PLACEMENT (IOC);  Surgeon: Steven Dingeldein, MD;  Location: [facility];  Service: Ophthalmology;  Laterality: Left;  fluid lot # [id number]  exp02/28/2019 US     01:15.3 AP%   25.2 CDE   35.06    CTR Right    FOOT SURGERY     HAND EXPLORATION  05/2024   pt unaware of what was done   JOINT REPLACEMENT     TKR   KNEE ARTHROPLASTY Left 11/24/2018   Procedure: COMPUTER ASSISTED TOTAL KNEE ARTHROPLASTY-LEFT;  Surgeon: Mardee Lynwood SQUIBB, MD;  Location: [facility];  Service: Orthopedics;  Laterality: Left;   NOSE SURGERY     PACEMAKER INSERTION     PACEMAKER LEADLESS INSERTION  10/25/2022   PACEMAKER LEADLESS INSERTION N/A 10/25/2022   Procedure: PACEMAKER LEADLESS INSERTION;  Surgeon: Ammon Blunt, MD;  Location: [facility];  Service: Cardiovascular;  Laterality: N/A;   RCR Left    SHOULDER ARTHROSCOPY Right    for bursitis   SPINE SURGERY  1973, 1978, 2016   Most recent Laminectomy to T11 and L3    Family History  Problem Relation Age of Onset   Congestive Heart Failure Mother    Diabetes Father    Congestive Heart Failure Father    Congestive Heart Failure Sister    Congestive Heart Failure Brother      Allergies  Allergen Reactions   Influenza Virus Vaccine Other (See Comments) and Swelling    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 Can take flu vaccine that does not relate to eggs Site of flu vaccine. Can take flu vaccine that does not relate to eggs    Penicillins Diarrhea    Has patient had a PCN reaction causing  immediate rash, facial/tongue/throat swelling, SOB or lightheadedness with hypotension: No Has patient had a PCN reaction causing severe rash involving mucus membranes or skin necrosis: No Has patient had a PCN reaction that required hospitalization No Has patient had a PCN reaction occurring within the last 10 years: No If all of the above answers are NO, then may proceed with Cephalosporin use.    Egg White [Egg Protein (Egg White)] Nausea Only   Lisinopril Cough   Haemophilus B Polysaccharide Vaccine Other (See Comments) and Swelling    Site of flu vaccine. Can take flu vaccine that does not relate to eggs       Latest Ref Rng & Units 04/07/2024    1:39 PM 10/28/2023    7:42 AM 08/16/2022   12:13 PM  CBC  WBC 4.0 - 10.5 K/uL 6.9  5.7  7.3   Hemoglobin 12.0 - 15.0 g/dL 87.2  87.1  87.6   Hematocrit 36.0 - 46.0 % 38.7  38.7  37.1   Platelets 150 - 400 K/uL 204  221.0  241.0       CMP     Component Value Date/Time   NA 141 07/09/2024 0759   K 4.3 07/09/2024 0759   CL 104 07/09/2024 0759   CO2 28 07/09/2024 0759   GLUCOSE 124 (H) 07/09/2024 0759   BUN 28 (H) 07/09/2024 0759   CREATININE 1.33 (H) 07/09/2024 0759   CREATININE 1.34 (H) 06/28/2023 1552   CALCIUM  9.6 07/09/2024 0759   PROT 7.1 07/09/2024 0759   ALBUMIN 4.4 07/09/2024 0759   AST 19 07/09/2024 0759   ALT 16 07/09/2024 0759   ALKPHOS 67 07/09/2024 0759   BILITOT 0.7 07/09/2024 0759   GFR 37.02 (L) 07/09/2024 0759   EGFR 40 (L) 06/28/2023 1552   GFRNONAA 40 (L) 04/07/2024 1339     No results found.     Assessment & Plan:   1. Bilateral carotid artery disease,  unspecified type (Primary) Bilateral carotid artery stenosis Right stenosis 40-59%, left stenosis 1-39%. Right velocities slightly worsened. No TIA or stroke symptoms. Intervention at 75% stenosis unless symptomatic. - Continue current management and monitoring. - Educated on symptoms of TIA and stroke, advised to seek immediate medical attention if symptoms occur. - VAS US  CAROTID; Future CAROTID; Future  2. Essential hypertension Continue antihypertensive medications as already ordered, these medications have been reviewed and there are no changes at this time.  3. Hyperlipidemia, unspecified hyperlipidemia type Continue statin as ordered and reviewed, no changes at this time  4. 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 Velocities improved. No symptoms present. - Continue monitoring subclavian artery flow and symptoms. - Encouraged regular physical activity, such as walking, while being cautious due to fall risk.   Current Outpatient Medications on File Prior to Visit  Medication Sig Dispense Refill   acetaminophen  (TYLENOL ) 500 MG tablet Take 500 mg by mouth every 6 (six) hours as needed.     amLODipine  (NORVASC ) 5 MG tablet TAKE 1 TABLET(5 MG) BY MOUTH TWICE DAILY 180 tablet 3   aspirin 81 MG EC tablet Take 81 mg by mouth daily.     atorvastatin  (LIPITOR) 40 MG tablet Take 1 tablet (40 mg total) by mouth at bedtime. TAKE 1 TABLET(40 MG) BY MOUTH AT BEDTIME 90 tablet 3   Coenzyme Q10 (COQ10) 200 MG CAPS Take 1 capsule by mouth daily.     Doxylamine  Succinate, Sleep, (SLEEP AID PO) Take 1  tablet by mouth at bedtime as needed (sleep).     FLUoxetine  (PROZAC ) 40 MG capsule TAKE 1 CAPSULE(40 MG) BY MOUTH DAILY 30 capsule 2   hydrochlorothiazide  (HYDRODIURIL ) 25 MG tablet TAKE 1 TABLET(25 MG) BY MOUTH EVERY EVENING 90 tablet 3   ketoconazole (NIZORAL) 2 % cream Apply topically.     losartan  (COZAAR )  100 MG tablet TAKE 1 TABLET(100 MG) BY MOUTH DAILY 90 tablet 3   Multiple Vitamins-Minerals (CENTRUM SILVER PO) Take 1 tablet by mouth daily.     nystatin  cream (MYCOSTATIN ) Apply 1 application topically 2 (two) times daily. 30 g 0   Polyethylene Glycol 3350 (MIRALAX PO) Take 17 g by mouth daily as needed (constipation).     senna (SENOKOT) 8.6 MG tablet Take 1 tablet by mouth daily.     albuterol  (VENTOLIN  HFA) 108 (90 Base) MCG/ACT inhaler Inhale 1-2 puffs into the lungs every 6 (six) hours as needed for wheezing or shortness of breath. (Patient not taking: Reported on 09/08/2024) 17 g 0   cyclobenzaprine  (FLEXERIL ) 5 MG tablet Take 1 tablet (5 mg total) by mouth 3 (three) times daily as needed for muscle spasms. (Patient not taking: Reported on 09/08/2024) 20 tablet 0   No current facility-administered medications on file prior to visit.    There are no Patient Instructions on file for this visit. Return in about 1 year (around 09/08/2025) for Carotid stenosis with carotid duplex to see GS/FB.   Zyair Rhein E Amzie Sillas, NP

## 2024-10-07 ENCOUNTER — Other Ambulatory Visit: Payer: Self-pay | Admitting: Internal Medicine

## 2024-10-22 ENCOUNTER — Emergency Department
Admission: EM | Admit: 2024-10-22 | Discharge: 2024-10-23 | Disposition: A | Discharge status: Home / Self Care | Attending: Emergency Medicine | Admitting: Emergency Medicine

## 2024-10-22 ENCOUNTER — Other Ambulatory Visit: Payer: Self-pay

## 2024-10-22 DIAGNOSIS — I10 Essential (primary) hypertension: Secondary | ICD-10-CM | POA: Diagnosis not present

## 2024-10-22 DIAGNOSIS — R030 Elevated blood-pressure reading, without diagnosis of hypertension: Secondary | ICD-10-CM | POA: Diagnosis present

## 2024-10-22 NOTE — ED Triage Notes (Signed)
 Pt presents for hypertension. Endorsing headache, ear ringing. Denies dizziness, nausea. Max pressure 217/76. Took home BP medications as prescribed.

## 2024-10-23 ENCOUNTER — Emergency Department

## 2024-10-23 LAB — BASIC METABOLIC PANEL WITH GFR
Anion gap: 13 (ref 5–15)
BUN: 40 mg/dL — ABNORMAL HIGH (ref 8–23)
CO2: 23 mmol/L (ref 22–32)
Calcium: 10.1 mg/dL (ref 8.9–10.3)
Chloride: 104 mmol/L (ref 98–111)
Creatinine, Ser: 1.34 mg/dL — ABNORMAL HIGH (ref 0.44–1.00)
GFR, Estimated: 39 mL/min — ABNORMAL LOW
Glucose, Bld: 110 mg/dL — ABNORMAL HIGH (ref 70–99)
Potassium: 4.7 mmol/L (ref 3.5–5.1)
Sodium: 140 mmol/L (ref 135–145)

## 2024-10-23 LAB — CBC WITH DIFFERENTIAL/PLATELET
Abs Immature Granulocytes: 0.02 K/uL (ref 0.00–0.07)
Basophils Absolute: 0 K/uL (ref 0.0–0.1)
Basophils Relative: 1 %
Eosinophils Absolute: 0.3 K/uL (ref 0.0–0.5)
Eosinophils Relative: 5 %
HCT: 39.3 % (ref 36.0–46.0)
Hemoglobin: 12.9 g/dL (ref 12.0–15.0)
Immature Granulocytes: 0 %
Lymphocytes Relative: 22 %
Lymphs Abs: 1.4 K/uL (ref 0.7–4.0)
MCH: 31.2 pg (ref 26.0–34.0)
MCHC: 32.8 g/dL (ref 30.0–36.0)
MCV: 95.2 fL (ref 80.0–100.0)
Monocytes Absolute: 0.6 K/uL (ref 0.1–1.0)
Monocytes Relative: 10 %
Neutro Abs: 4 K/uL (ref 1.7–7.7)
Neutrophils Relative %: 62 %
Platelets: 198 K/uL (ref 150–400)
RBC: 4.13 MIL/uL (ref 3.87–5.11)
RDW: 13 % (ref 11.5–15.5)
WBC: 6.4 K/uL (ref 4.0–10.5)
nRBC: 0 % (ref 0.0–0.2)

## 2024-10-23 LAB — TROPONIN T, HIGH SENSITIVITY
Troponin T High Sensitivity: 26 ng/L — ABNORMAL HIGH (ref 0–19)
Troponin T High Sensitivity: 25 ng/L — ABNORMAL HIGH (ref 0–19)

## 2024-10-23 MED ORDER — IOHEXOL 350 MG/ML SOLN
75.0000 mL | Freq: Once | INTRAVENOUS | Status: AC | PRN
  Administered 2024-10-23: 75 mL via INTRAVENOUS

## 2024-10-23 NOTE — ED Provider Notes (Signed)
 "  [facility] Provider Note    Event Date/Time   First MD Initiated Contact with Patient 10/22/24 2338     (approximate)   History   Hypertension   HPI  Amber Bridges is a 84 y.o. female who presents to the emergency department today because of concerns for high blood pressure.  The patient says that even yesterday she was not feeling well.  She felt low energy.  When she woke up this morning she felt the same.  She then started developing a headache.  This prompted her to check her blood pressure and it was noted to be elevated.  She continue to watch her blood pressure and it continued to be elevated throughout the day.  Her headache did improve.  She also had some right arm tingling that has been intermittent.     Physical Exam   Triage Vital Signs: ED Triage Vitals  Encounter Vitals Group     BP 10/22/24 2124 (!) 151/67     Girls Systolic BP Percentile --      Girls Diastolic BP Percentile --      Boys Systolic BP Percentile --      Boys Diastolic BP Percentile --      Pulse Rate 10/22/24 2124 79     Resp 10/22/24 2124 18     Temp 10/22/24 2124 98 F (36.7 C)     Temp Source 10/22/24 2124 Oral     SpO2 10/22/24 2124 97 %     Weight 10/22/24 2126 244 lb (110.7 kg)     Height 10/22/24 2126 5' 7.5 (1.715 m)     Head Circumference --      Peak Flow --      Pain Score 10/22/24 2126 0     Pain Loc --      Pain Education --      Exclude from Growth Chart --     Most recent vital signs: Vitals:   10/22/24 2124  BP: (!) 151/67  Pulse: 79  Resp: 18  Temp: 98 F (36.7 C)  SpO2: 97%   General: Awake, alert, oriented. CV:  Good peripheral perfusion. Regular rate and rhythm. Resp:  Normal effort. Lungs clear. Abd:  No distention.    ED Results / Procedures / Treatments   Labs (all labs ordered are listed, but only abnormal results are displayed) Labs Reviewed  BASIC METABOLIC PANEL WITH GFR - Abnormal; Notable for the following  components:      Result Value   Glucose, Bld 110 (*)    BUN 40 (*)    Creatinine, Ser 1.34 (*)    GFR, Estimated 39 (*)    All other components within normal limits  TROPONIN T, HIGH SENSITIVITY - Abnormal; Notable for the following components:   Troponin T High Sensitivity 26 (*)    All other components within normal limits  TROPONIN T, HIGH SENSITIVITY - Abnormal; Notable for the following components:   Troponin T High Sensitivity 25 (*)    All other components within normal limits  CBC WITH DIFFERENTIAL/PLATELET     EKG  I, Guadalupe Eagles, attending physician, personally viewed and interpreted this EKG  EKG Time: 0121 Rate: 53 Rhythm: sinus bradycardia with 1st degree av block Axis: left axis deviation Intervals: qtc 486 QRS: LBBB ST changes: no st elevation Impression: abnormal ekg   RADIOLOGY I independently interpreted and visualized the CTA head and neck. My interpretation: No ICH Radiology interpretation:  IMPRESSION:  1. Negative CTA of the head and neck. No large vessel occlusion or  other emergent finding. No aneurysm.  2. Mild atheromatous change about the carotid bifurcations and  carotid siphons without hemodynamically significant stenosis.  3. No other acute intracranial abnormality.    Aortic Atherosclerosis (ICD10-I70.0).      PROCEDURES:  Critical Care performed: No    MEDICATIONS ORDERED IN ED: Medications - No data to display   IMPRESSION / MDM / ASSESSMENT AND PLAN / ED COURSE  I reviewed the triage vital signs and the nursing notes.                              Differential diagnosis includes, but is not limited to, ICH, hypertension, tension headache, ACS  Patient's presentation is most consistent with acute presentation with potential threat to life or bodily function.  Patient presented to the emergency department today because of concerns for high blood pressure.  Patient did have a headache earlier in the day.  Time exam  patient is feeling better.  Blood pressure while still elevated not as significant as what she was experiencing at home.  Did obtain a CT angio head and neck which did not show any concerning abnormalities.  Blood work without findings concerning for acute kidney injury.  Troponin was minimally elevated however stable on repeat.  I think this is more likely patient's baseline than a sign of acute coronary syndrome.  At this time do think is reasonable for patient to be discharged.  Did encourage patient to follow-up with primary care.     FINAL CLINICAL IMPRESSION(S) / ED DIAGNOSES   Final diagnoses:  Hypertension, unspecified type     Note:  This document was prepared using Dragon voice recognition software and may include unintentional dictation errors.    Floy Roberts, MD 10/23/24 [phone]  "

## 2024-11-10 ENCOUNTER — Other Ambulatory Visit: Payer: Self-pay | Admitting: Internal Medicine

## 2024-11-11 ENCOUNTER — Other Ambulatory Visit

## 2024-11-11 DIAGNOSIS — I10 Essential (primary) hypertension: Secondary | ICD-10-CM

## 2024-11-11 DIAGNOSIS — E785 Hyperlipidemia, unspecified: Secondary | ICD-10-CM

## 2024-11-11 DIAGNOSIS — R739 Hyperglycemia, unspecified: Secondary | ICD-10-CM

## 2024-11-11 LAB — LIPID PANEL
Cholesterol: 147 mg/dL (ref 28–200)
HDL: 61.5 mg/dL
LDL Cholesterol: 69 mg/dL (ref 10–99)
NonHDL: 85.65
Total CHOL/HDL Ratio: 2
Triglycerides: 83 mg/dL (ref 10.0–149.0)
VLDL: 16.6 mg/dL (ref 0.0–40.0)

## 2024-11-11 LAB — HEPATIC FUNCTION PANEL
ALT: 15 U/L (ref 3–35)
AST: 16 U/L (ref 5–37)
Albumin: 4.3 g/dL (ref 3.5–5.2)
Alkaline Phosphatase: 61 U/L (ref 39–117)
Bilirubin, Direct: 0.2 mg/dL (ref 0.1–0.3)
Total Bilirubin: 0.8 mg/dL (ref 0.2–1.2)
Total Protein: 6.7 g/dL (ref 6.0–8.3)

## 2024-11-11 LAB — BASIC METABOLIC PANEL WITH GFR
BUN: 31 mg/dL — ABNORMAL HIGH (ref 6–23)
CO2: 27 meq/L (ref 19–32)
Calcium: 9.6 mg/dL (ref 8.4–10.5)
Chloride: 105 meq/L (ref 96–112)
Creatinine, Ser: 1.33 mg/dL — ABNORMAL HIGH (ref 0.40–1.20)
GFR: 36.93 mL/min — ABNORMAL LOW
Glucose, Bld: 111 mg/dL — ABNORMAL HIGH (ref 70–99)
Potassium: 4.7 meq/L (ref 3.5–5.1)
Sodium: 141 meq/L (ref 135–145)

## 2024-11-11 LAB — HEMOGLOBIN A1C: Hgb A1c MFr Bld: 6.1 % (ref 4.6–6.5)

## 2024-11-11 LAB — TSH: TSH: 1.92 u[IU]/mL (ref 0.35–5.50)

## 2024-11-13 ENCOUNTER — Ambulatory Visit: Admitting: Internal Medicine

## 2024-11-13 ENCOUNTER — Encounter: Payer: Self-pay | Admitting: Internal Medicine

## 2024-11-13 DIAGNOSIS — E785 Hyperlipidemia, unspecified: Secondary | ICD-10-CM

## 2024-11-13 DIAGNOSIS — R739 Hyperglycemia, unspecified: Secondary | ICD-10-CM

## 2024-11-13 DIAGNOSIS — I10 Essential (primary) hypertension: Secondary | ICD-10-CM

## 2024-11-13 MED ORDER — FLUOXETINE HCL 40 MG PO CAPS: 40.0000 mg | ORAL_CAPSULE | Freq: Every day | ORAL | 1 refills | Status: AC

## 2024-11-13 MED ORDER — HYDROCHLOROTHIAZIDE 25 MG PO TABS: ORAL_TABLET | ORAL | 1 refills | Status: AC

## 2024-11-13 MED ORDER — ATORVASTATIN CALCIUM 40 MG PO TABS: 40.0000 mg | ORAL_TABLET | Freq: Every day | ORAL | 3 refills | Status: AC

## 2024-11-13 NOTE — Progress Notes (Unsigned)
 "  Subjective:    Patient ID: Amber Bridges, female    DOB: Nov 28, 1940, 84 y.o.   MRN: [id number]  Patient here for  Chief Complaint  Patient presents with   Medical Management of Chronic Issues    HPI Here for a scheduled follow up. 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 Had f/u with cardiology 11/05/24. ED evaluattion 10/22/24 - headache and mild hypertension. CTA head/eck negative. Cardiology recommended to increase spironolactone to 25mg  q day. Also added hydralazine  25mg  po tid prn if SBP > 160. Continue amlodipine  and losartan . F/u [facility] - 09/08/24 - Bilateral carotid artery stenosis. Right stenosis 40-59%, left stenosis 1-39%. Right velocities slightly worsened. No TIA or stroke symptoms. Intervention at 75% stenosis unless  symptomatic. Also f/u right subclavian artery stenosis. Recommended to follow - f/u one year.    Past Medical History:  Diagnosis Date   Arthritis    Bronchitis    Coronary artery disease    Depression    HOH (hard of hearing)    AIDS   Hypertension    Sleep apnea    CPAP   Past Surgical History:  Procedure Laterality Date   ABDOMINAL HYSTERECTOMY     APPENDECTOMY     BACK SURGERY     X 3   CATARACT EXTRACTION W/ INTRAOCULAR LENS IMPLANT Right 2017   CATARACT EXTRACTION W/PHACO Left 08/08/2016   Procedure: CATARACT EXTRACTION PHACO AND INTRAOCULAR LENS PLACEMENT (IOC);  Surgeon: Steven Dingeldein, MD;  Location: [facility];  Service: Ophthalmology;  Laterality: Left;  fluid lot # [id number]  exp02/28/2019 US     01:15.3 AP%   25.2 CDE   35.06    CTR Right    FOOT SURGERY     HAND EXPLORATION  05/2024   pt unaware of what was done   JOINT REPLACEMENT     TKR   KNEE ARTHROPLASTY Left 11/24/2018   Procedure: COMPUTER ASSISTED TOTAL KNEE ARTHROPLASTY-LEFT;  Surgeon: Mardee Lynwood SQUIBB, MD;  Location: [facility];  Service: Orthopedics;  Laterality: Left;   NOSE SURGERY     PACEMAKER INSERTION     PACEMAKER LEADLESS INSERTION  10/25/2022   PACEMAKER  LEADLESS INSERTION N/A 10/25/2022   Procedure: PACEMAKER LEADLESS INSERTION;  Surgeon: Ammon Blunt, MD;  Location: [facility];  Service: Cardiovascular;  Laterality: N/A;   RCR Left    SHOULDER ARTHROSCOPY Right    for bursitis   SPINE SURGERY  1973, 1978, 2016   Most recent Laminectomy to T11 and L3   Family History  Problem Relation Age of Onset   Congestive Heart Failure Mother    Diabetes Father    Congestive Heart Failure Father    Congestive Heart Failure Sister    Congestive Heart Failure Brother    Social History   Socioeconomic History   Marital status: Widowed    Spouse name: Not on file   Number of children: Not on file   Years of education: Not on file   Highest education level: Not on file  Occupational History   Not on file  Tobacco Use   Smoking status: Former    Current packs/day: 0.00    Types: Cigarettes    Quit date: 10/08/1960    Years since quitting: 64.1   Smokeless tobacco: Never  Vaping Use   Vaping status: Never Used  Substance and Sexual Activity   Alcohol use: Yes    Comment: 2 cocktails   Drug use: Never   Sexual activity: Not  on file  Other Topics Concern   Not on file  Social History Narrative   Not on file   Social Drivers of Health   Tobacco Use: Medium Risk (11/13/2024)   Patient History    Smoking Tobacco Use: Former    Smokeless Tobacco Use: Never    Passive Exposure: Not on file  Financial Resource Strain: Not on file  Food Insecurity: No Food Insecurity (10/25/2022)   Hunger Vital Sign    Worried About Running Out of Food in the Last Year: Never true    Ran Out of Food in the Last Year: Never true  Transportation Needs: No Transportation Needs (10/25/2022)   PRAPARE - Administrator, Civil Service (Medical): No    Lack of Transportation (Non-Medical): No  Physical Activity: Not on file  Stress: Not on file  Social Connections: Not on file  Depression (PHQ2-9): Medium Risk (07/13/2024)    Depression (PHQ2-9)    PHQ-2 Score: 7  Alcohol Screen: Not on file  Housing: Unknown (07/06/2024)   Received from [facility]   Epic    Unable to Pay for Housing in the Last Year: Not on file    Number of Times Moved in the Last Year: Not on file    At any time in the past 12 months, were you homeless or living in a shelter (including now)?: No  Utilities: Not At Risk (10/25/2022)   [facility] Utilities    Threatened with loss of utilities: No  Health Literacy: Not on file     Review of Systems     Objective:     BP (!) 142/66   Pulse 60   Temp 97.8 F (36.6 C) (Oral)   Ht 5' 7.5 (1.715 m)   Wt 242 lb 3.2 oz (109.9 kg)   SpO2 98%   BMI 37.37 kg/m  Wt Readings from Last 3 Encounters:  11/13/24 242 lb 3.2 oz (109.9 kg)  10/22/24 244 lb (110.7 kg)  09/08/24 241 lb 9.6 oz (109.6 kg)    Physical Exam  {Perform Simple Foot Exam  Perform Detailed exam:1} {Insert foot Exam (Optional):30965}   Outpatient Encounter Medications as of 11/13/2024  Medication Sig   Doxylamine  Succinate, Sleep, (SLEEP AID PO) Take 1 tablet by mouth at bedtime as needed (sleep).   acetaminophen  (TYLENOL ) 500 MG tablet Take 500 mg by mouth every 6 (six) hours as needed.   amLODipine  (NORVASC ) 5 MG tablet TAKE 1 TABLET(5 MG) BY MOUTH TWICE DAILY   aspirin 81 MG EC tablet Take 81 mg by mouth daily.   atorvastatin  (LIPITOR) 40 MG tablet Take 1 tablet (40 mg total) by mouth at bedtime. TAKE 1 TABLET(40 MG) BY MOUTH AT BEDTIME   Coenzyme Q10 (COQ10) 200 MG CAPS Take 1 capsule by mouth daily.   FLUoxetine  (PROZAC ) 40 MG capsule TAKE 1 CAPSULE(40 MG) BY MOUTH DAILY   hydrochlorothiazide  (HYDRODIURIL ) 25 MG tablet TAKE 1 TABLET(25 MG) BY MOUTH EVERY EVENING   ketoconazole (NIZORAL) 2 % cream Apply topically.   losartan  (COZAAR ) 100 MG tablet TAKE 1 TABLET(100 MG) BY MOUTH DAILY   Multiple Vitamins-Minerals (CENTRUM SILVER PO) Take 1 tablet by mouth daily.   nystatin  cream (MYCOSTATIN ) Apply 1  application topically 2 (two) times daily.   Polyethylene Glycol 3350 (MIRALAX PO) Take 17 g by mouth daily as needed (constipation).   senna (SENOKOT) 8.6 MG tablet Take 1 tablet by mouth daily.   [DISCONTINUED] albuterol  (VENTOLIN  HFA) 108 (90 Base) MCG/ACT  inhaler Inhale 1-2 puffs into the lungs every 6 (six) hours as needed for wheezing or shortness of breath. (Patient not taking: Reported on 09/08/2024)   [DISCONTINUED] cyclobenzaprine  (FLEXERIL ) 5 MG tablet Take 1 tablet (5 mg total) by mouth 3 (three) times daily as needed for muscle spasms. (Patient not taking: Reported on 09/08/2024)   No facility-administered encounter medications on file as of 11/13/2024.     Lab Results  Component Value Date   WBC 6.4 10/23/2024   HGB 12.9 10/23/2024   HCT 39.3 10/23/2024   PLT 198 10/23/2024   GLUCOSE 111 (H) 11/11/2024   CHOL 147 11/11/2024   TRIG 83.0 11/11/2024   HDL 61.50 11/11/2024   LDLCALC 69 11/11/2024   ALT 15 11/11/2024   AST 16 11/11/2024   NA 141 11/11/2024   K 4.7 11/11/2024   CL 105 11/11/2024   CREATININE 1.33 (H) 11/11/2024   BUN 31 (H) 11/11/2024   CO2 27 11/11/2024   TSH 1.92 11/11/2024   INR 0.96 11/12/2018   HGBA1C 6.1 11/11/2024    CT ANGIO HEAD NECK W WO CM Result Date: 10/23/2024 CLINICAL DATA:  Initial evaluation for acute headache. EXAM: CT ANGIOGRAPHY HEAD AND NECK WITH AND WITHOUT CONTRAST TECHNIQUE: Multidetector CT imaging of the head and neck was performed using the standard protocol during bolus administration of intravenous contrast. Multiplanar CT image reconstructions and MIPs were obtained to evaluate the vascular anatomy. Carotid stenosis measurements (when applicable) are obtained utilizing NASCET criteria, using the distal internal carotid diameter as the denominator. RADIATION DOSE REDUCTION: This exam was performed according to the departmental dose-optimization program which includes automated exposure control, adjustment of the mA and/or kV  according to patient size and/or use of iterative reconstruction technique. CONTRAST:  75mL OMNIPAQUE  IOHEXOL  350 MG/ML SOLN COMPARISON:  Prior CT from 04/07/2024. FINDINGS: CT HEAD FINDINGS Brain: Cerebral volume within normal limits. No acute intracranial hemorrhage. No acute large vessel territory infarct. No mass lesion, midline shift or mass effect. No hydrocephalus or extra-axial fluid collection. Vascular: No abnormal hyperdense vessel. Scattered vascular calcifications noted within the carotid siphons. Skull: Scalp soft tissues within normal limits. Calvarium intact. Hyperostosis frontalis interna noted. Sinuses/Orbits: Globes orbital soft tissues within normal limits. Left maxillary sinus retention cyst noted. Paranasal sinuses are otherwise clear. No mastoid effusion. Other: None. Review of the MIP images confirms the above findings CTA NECK FINDINGS Aortic arch: Visualized aortic arch within normal limits for caliber with standard branch pattern. Mild aortic atherosclerosis. No stenosis about the origin the great vessels. Right carotid system: Right common and internal carotid arteries are patent without dissection. Mild atheromatous change about the right carotid bulb without hemodynamically significant stenosis. Left carotid system: Left common and internal carotid arteries are patent without dissection. Atheromatous change about the left carotid bulb without hemodynamically significant stenosis. Vertebral arteries: Both vertebral arteries arise from subclavian arteries. Vertebral arteries are patent without stenosis or dissection. Skeleton: No worrisome osseous lesions. Moderate spondylosis present at C5-6 and C6-7. Other neck: No other acute finding. Upper chest: No other acute finding. Review of the MIP images confirms the above findings CTA HEAD FINDINGS Anterior circulation: Mild atheromatous change about the carotid siphons without hemodynamically significant stenosis. A1 segments patent  bilaterally. Normal anterior communicating complex. Anterior cerebral arteries patent without significant stenosis. No M1 stenosis or occlusion. No proximal MCA branch occlusion or high-grade stenosis. Distal MCA branches perfused and symmetric. Posterior circulation: Both V4 segments patent without significant stenosis. Both PICA patent at their origins. Basilar  patent without stenosis. Superior cerebral arteries patent bilaterally. Right PCA supplied via the basilar. Fetal type origin left PCA. Both PCAs patent to their distal aspects without significant stenosis. Venous sinuses: Patent allowing for timing the contrast bolus. Anatomic variants: Fetal type left PCA. No intracranial aneurysm or other vascular malformation. Review of the MIP images confirms the above findings IMPRESSION: 1. Negative CTA of the head and neck. No large vessel occlusion or other emergent finding. No aneurysm. 2. Mild atheromatous change about the carotid bifurcations and carotid siphons without hemodynamically significant stenosis. 3. No other acute intracranial abnormality. Aortic Atherosclerosis (ICD10-I70.0). Electronically Signed   By: Morene Hoard M.D.   On: 10/23/2024 03:05       Assessment & Plan:  Hyperlipidemia, unspecified hyperlipidemia type  Essential hypertension  Hyperglycemia     Allena Hamilton, MD "

## 2025-01-01 ENCOUNTER — Ambulatory Visit: Admitting: Internal Medicine

## 2025-09-08 ENCOUNTER — Encounter (INDEPENDENT_AMBULATORY_CARE_PROVIDER_SITE_OTHER)

## 2025-09-08 ENCOUNTER — Ambulatory Visit (INDEPENDENT_AMBULATORY_CARE_PROVIDER_SITE_OTHER): Admitting: Nurse Practitioner
